# Patient Record
Sex: Female | Born: 1955
Health system: Southern US, Community
[De-identification: ages and names within clinical notes are randomized; demographics above are authoritative.]

## PROBLEM LIST (undated history)

## (undated) DIAGNOSIS — K648 Other hemorrhoids: Secondary | ICD-10-CM

## (undated) DIAGNOSIS — E785 Hyperlipidemia, unspecified: Secondary | ICD-10-CM

## (undated) DIAGNOSIS — F419 Anxiety disorder, unspecified: Secondary | ICD-10-CM

## (undated) DIAGNOSIS — L719 Rosacea, unspecified: Secondary | ICD-10-CM

## (undated) DIAGNOSIS — T8859XA Other complications of anesthesia, initial encounter: Secondary | ICD-10-CM

## (undated) DIAGNOSIS — G43909 Migraine, unspecified, not intractable, without status migrainosus: Secondary | ICD-10-CM

## (undated) DIAGNOSIS — Z87440 Personal history of urinary (tract) infections: Secondary | ICD-10-CM

## (undated) DIAGNOSIS — K219 Gastro-esophageal reflux disease without esophagitis: Secondary | ICD-10-CM

## (undated) DIAGNOSIS — F329 Major depressive disorder, single episode, unspecified: Secondary | ICD-10-CM

## (undated) DIAGNOSIS — R42 Dizziness and giddiness: Secondary | ICD-10-CM

## (undated) DIAGNOSIS — M1990 Unspecified osteoarthritis, unspecified site: Secondary | ICD-10-CM

## (undated) DIAGNOSIS — F32A Depression, unspecified: Secondary | ICD-10-CM

## (undated) DIAGNOSIS — Z8719 Personal history of other diseases of the digestive system: Secondary | ICD-10-CM

## (undated) DIAGNOSIS — I10 Essential (primary) hypertension: Secondary | ICD-10-CM

## (undated) HISTORY — DX: Hyperlipidemia, unspecified: E78.5

## (undated) HISTORY — DX: Essential (primary) hypertension: I10

## (undated) HISTORY — DX: Depression, unspecified: F32.A

## (undated) HISTORY — DX: Rosacea, unspecified: L71.9

## (undated) HISTORY — DX: Anxiety disorder, unspecified: F41.9

## (undated) HISTORY — DX: Other hemorrhoids: K64.8

## (undated) HISTORY — DX: Migraine, unspecified, not intractable, without status migrainosus: G43.909

## (undated) HISTORY — DX: Major depressive disorder, single episode, unspecified: F32.9

## (undated) HISTORY — PX: WISDOM TOOTH EXTRACTION: SHX21

## (undated) HISTORY — DX: Personal history of urinary (tract) infections: Z87.440

## (undated) HISTORY — DX: Dizziness and giddiness: R42

---

## 2007-11-19 ENCOUNTER — Ambulatory Visit (HOSPITAL_COMMUNITY): Admission: RE | Admit: 2007-11-19 | Discharge: 2007-11-19 | Payer: Self-pay | Admitting: Internal Medicine

## 2007-11-26 ENCOUNTER — Encounter: Admission: RE | Admit: 2007-11-26 | Discharge: 2007-11-26 | Payer: Self-pay | Admitting: Internal Medicine

## 2008-11-02 ENCOUNTER — Ambulatory Visit: Payer: Self-pay | Admitting: Internal Medicine

## 2008-11-13 ENCOUNTER — Ambulatory Visit: Payer: Self-pay | Admitting: Internal Medicine

## 2008-11-13 DIAGNOSIS — K648 Other hemorrhoids: Secondary | ICD-10-CM

## 2008-11-13 HISTORY — DX: Other hemorrhoids: K64.8

## 2008-11-13 HISTORY — PX: COLONOSCOPY: SHX174

## 2009-02-10 ENCOUNTER — Encounter: Admission: RE | Admit: 2009-02-10 | Discharge: 2009-02-10 | Payer: Self-pay | Admitting: Family Medicine

## 2009-09-02 ENCOUNTER — Ambulatory Visit: Payer: Self-pay | Admitting: Obstetrics and Gynecology

## 2010-03-23 ENCOUNTER — Encounter
Admission: RE | Admit: 2010-03-23 | Discharge: 2010-03-23 | Payer: Self-pay | Source: Home / Self Care | Attending: Family Medicine | Admitting: Family Medicine

## 2010-10-17 ENCOUNTER — Ambulatory Visit (INDEPENDENT_AMBULATORY_CARE_PROVIDER_SITE_OTHER): Payer: BC Managed Care – PPO | Admitting: Internal Medicine

## 2010-10-17 ENCOUNTER — Encounter: Payer: Self-pay | Admitting: Internal Medicine

## 2010-10-17 VITALS — BP 134/72 | HR 84 | Ht 64.0 in | Wt 168.0 lb

## 2010-10-17 DIAGNOSIS — R12 Heartburn: Secondary | ICD-10-CM | POA: Insufficient documentation

## 2010-10-17 DIAGNOSIS — Z6825 Body mass index (BMI) 25.0-25.9, adult: Secondary | ICD-10-CM

## 2010-10-17 DIAGNOSIS — R1013 Epigastric pain: Secondary | ICD-10-CM

## 2010-10-17 DIAGNOSIS — E663 Overweight: Secondary | ICD-10-CM

## 2010-10-17 DIAGNOSIS — K439 Ventral hernia without obstruction or gangrene: Secondary | ICD-10-CM | POA: Insufficient documentation

## 2010-10-17 NOTE — Assessment & Plan Note (Signed)
She is aware of her 57 and her exercise is limited by her epigastric symptoms at this time. She is hoping to return to her exercise regimen and lose weight.

## 2010-10-17 NOTE — Patient Instructions (Addendum)
You were seen for epigastric pain and ? of abdominal wall hernia today. Surgery appointment will be made for further evaluation. You also have what sounds like some nocturnal heartburn problems. Read the instructions below and you may use Tums or Mylanta as needed for that. Pay attention to the foods you eat. We will give you a call concerning your appointment with [facility].  Gastroesophageal Reflux Disease (GERD) Your caregiver has diagnosed your chest discomfort as caused by gastroesophageal reflux disease (GERD). GERD is caused by a reflux of acid from your stomach into the digestive tube between your mouth and stomach (esophagus). Acid in contact with the esophagus causes soreness (inflammation) resulting in heartburn or chest pain. It may cause small holes in the lining of the esophagus (ulcers). CAUSES  Increased body weight puts pressure on the stomach, making acid rise.   Smoking increases acid production.   Alcohol decreases pressure on the valve between the stomach and esophagus (lower esophageal sphincter), allowing acid from the stomach into the esophagus.   Late evening meals and a full stomach increase pressure and acid production.   Lower esophageal sphincter is malformed.   Sometimes, no reason is found.  HOME CARE INSTRUCTIONS  Change the factors that you can control. Weight, smoking, or alcohol changes may be difficult to change on your own. Your caregiver can provide guidance and medical therapy.   Raising the head of your bed may help you to sleep.   Over-the-counter medicines will decrease acid production. Your caregiver can also prescribe medicines for this. Only take over-the-counter or prescription medicines for pain, discomfort, or fever as directed by your caregiver.   1/2 to 1 teaspoon of an antacid taken every hour while awake, with meals, and at bedtime, can neutralize acid.   DO NOT take aspirin, ibuprofen, or other nonsteroidal  anti-inflammatory drugs (NSAIDs).  SEEK IMMEDIATE MEDICAL CARE IF:  The pain changes in location (radiates into arms, neck, jaw, teeth, or back), intensity, or duration.   You start feeling sick to your stomach (nauseous), start throwing up (vomiting), or sweating (diaphoresis).   You develop left arm or jaw pain.   You develop pain going into your back, shortness of breath, or you pass out.   There is vomiting of fluid that is green, yellow, or looks like coffee grounds or blood.  These symptoms could signal other problems, such as heart disease. MAKE SURE YOU:  Understand these instructions.   Will watch your condition.   Will get help right away if you are not doing well or get worse.  Document Released: 01/04/2005 Document Re-Released: 06/21/2009 [facility] Patient Information 2011 [place], [place].

## 2010-10-17 NOTE — Assessment & Plan Note (Signed)
She is having some episodes at night that sound like acid reflux or heartburn. I have suggested she try some antacids, she should pay close attention to the types of food she is eating and the triggers using an antacid when she eats something like a tomato-based or citrus food. Lifestyle instructions were given to the patient as well. Some of her recent weight gain may be contributing. I also question whether or not this could be some sort of peri-menopausal problem and may actually be a hot flash.

## 2010-10-17 NOTE — Progress Notes (Signed)
  Subjective:    Patient ID: Michele Lowery, female    DOB: Dec 05, 1955, 55 y.o.   MRN: [id number]  HPI 55 yo white woman  that developed epigastric pain doing yoga several months ago. Something popped out and was painful and tender. Drs. Guest, Gottsegen and richter ? Hernia.   Also getting a "washing pain" coming up through her abdomen and a warmth or hot sensation that follows. It is usually in early AM (3 AM). Was going to gym but stopped in 2011 but stopped due to this pain, she was doing > 100 crunches. Has gained weight since then, also going through menopause - no menses in 4-5 months.  Also saw Dr. Eda Paschal and cyst on fallopian tube found, thought possibly causing lower abdominal and pelvic pain but he thought conservative approach was prudent. Past Medical History  Diagnosis Date  . Vertigo   . Rosacea   . HLD (hyperlipidemia)   . HTN (hypertension)   . Depression   . Internal hemorrhoids 11/13/08  . Hx: UTI (urinary tract infection)    Past Surgical History  Procedure Date  . Colonoscopy 11/13/2008    internal hemrrhoids    reports that she has quit smoking. She has never used smokeless tobacco. She reports that she drinks alcohol. She reports that she does not use illicit drugs. family history includes Heart disease in her father.  There is no history of Colon cancer. Allergies  Allergen Reactions  . Doxycycline     REACTION: nausea and vomiting  . Sulfonamide Derivatives     REACTION: "i can't remember the reaction"        Review of Systems + fatigue, skin rash (dermatophyte), and leg/foot cramps. All other ROS negative or as above.    Objective:   Physical Exam  Constitutional: She is oriented to person, place, and time. She appears well-developed and well-nourished.       overweight  HENT:  Head: Normocephalic.  Cardiovascular: Normal rate, regular rhythm and normal heart sounds.   Pulmonary/Chest: Effort normal and breath sounds normal.  Abdominal: Soft.  Bowel sounds are normal. She exhibits no distension and no mass. There is tenderness. There is no rebound and no guarding.       Tender in epigastrium but not xiphoid ? Some defect in the area but no hernia detected even with flexion of abdomen and bearing down  Neurological: She is alert and oriented to person, place, and time.  Psychiatric: She has a normal mood and affect.          Assessment & Plan:

## 2010-10-17 NOTE — Assessment & Plan Note (Signed)
Her history is suggestive of an abdominal hernia but I cannot find on exam today and apparently no other physician has found it. She is quite bothered by this and her tenderness in the abdominal history, however see a surgeon for further evaluation. I told her that a CT scan could be considered but I would wait for the surgeon's evaluation for any other testing. I don't think this is a gastric or other problem that endoscopy would help diagnose though I suppose it could be needed but will again wait for the surgeon's opinion.

## 2010-10-27 ENCOUNTER — Ambulatory Visit (INDEPENDENT_AMBULATORY_CARE_PROVIDER_SITE_OTHER): Payer: BC Managed Care – PPO | Admitting: General Surgery

## 2010-10-27 ENCOUNTER — Encounter (INDEPENDENT_AMBULATORY_CARE_PROVIDER_SITE_OTHER): Payer: Self-pay | Admitting: General Surgery

## 2010-10-27 DIAGNOSIS — R1013 Epigastric pain: Secondary | ICD-10-CM

## 2010-10-27 NOTE — Progress Notes (Signed)
Subjective:     Patient ID: Michele Lowery, female   DOB: 08-Nov-1955, 55 y.o.   MRN: [id number]  HPI  This is a 54 year-old Caucasian female, sent to me for the courtesy of Dr. Nadyne Coombes and Dr. Stan Head for evaluation of epigastric abdominal pain. An epigastric hernia is suspected but has not been documented.  The patient states that she has had diffuse abdominal pain for 9 months. This is intermittent without any aggravating or alleviating factors. She has seen Dr. Edyth Gunnels and he told her that she has an ovarian cyst that may or may not require surgery. She is now in the peri-menopausal state.  A few months ago, while doing yoga, she felt a lump that popped out in the mid epigastrium. This has happened at other times during  other exercises. If it can be painful. She does not have a lot of pain  today. She saw Dr. Stan Head who thought that she might have an epigastric hernia that could not be documented on physical exam. The patient was sent to me for further evaluation. She is not in any pain today.         Past Medical History  Diagnosis Date  . Vertigo   . Rosacea   . HLD (hyperlipidemia)   . HTN (hypertension)   . Internal hemorrhoids 11/13/08  . Hx: UTI (urinary tract infection)    Current outpatient prescriptions:amLODipine (NORVASC) 5 MG tablet, Take 5 mg by mouth daily.  , Disp: , Rfl: ;  aspirin 81 MG tablet, Take 81 mg by mouth daily.  , Disp: , Rfl: ;  buPROPion (WELLBUTRIN SR) 150 MG 12 hr tablet, 150 mg. Taking two a week--tapering off, Disp: , Rfl: ;  losartan-hydrochlorothiazide (HYZAAR) 100-12.5 MG per tablet, Take 1 tablet by mouth daily.  , Disp: , Rfl:  metroNIDAZOLE (METROGEL) 0.75 % gel, Apply topically 2 (two) times daily.  , Disp: , Rfl: ;  Omega-3 Fatty Acids (FISH OIL PO), Take 4 tablets by mouth daily. , Disp: , Rfl: ;  pravastatin (PRAVACHOL) 40 MG tablet, Take 40 mg by mouth daily.  , Disp: , Rfl: ;  tetracycline (ACHROMYCIN,SUMYCIN) 500 MG  capsule, Take 500 mg by mouth as needed.  , Disp: , Rfl: ;  Calcium Carbonate Antacid (ALKA-SELTZER ANTACID PO), Take by mouth.  , Disp: , Rfl:   Allergies  Allergen Reactions  . Doxycycline Nausea And Vomiting    REACTION: nausea and vomiting  . Lisinopril Cough  . Sulfonamide Derivatives     REACTION: "i can't remember the reaction"    History  Substance Use Topics  . Smoking status: Never Smoker   . Smokeless tobacco: Never Used  . Alcohol Use: 0.5 oz/week    1 drink(s) per week     1-2 per month        .Review of Systems  Constitutional: Negative.   HENT: Negative.   Eyes: Negative.   Respiratory: Negative.   Cardiovascular: Negative.   Gastrointestinal: Positive for abdominal pain. Negative for nausea, vomiting, diarrhea, constipation, blood in stool, abdominal distention, anal bleeding and rectal pain.  Genitourinary: Negative.   Musculoskeletal: Negative.   Skin: Negative.   Neurological: Negative.   Hematological: Negative.   Psychiatric/Behavioral: Negative.        Objective:   Physical Exam  Constitutional: She is oriented to person, place, and time. She appears well-developed and well-nourished. No distress.  HENT:  Head: Normocephalic and atraumatic.  Mouth/Throat: No oropharyngeal exudate.  Eyes: Conjunctivae and EOM are normal. Left eye exhibits discharge. No scleral icterus.  Neck: Normal range of motion. Neck supple. No JVD present. No thyromegaly present.  Cardiovascular: Normal rate, regular rhythm and normal heart sounds.  Exam reveals no friction rub.   No murmur heard. Pulmonary/Chest: Effort normal and breath sounds normal. No respiratory distress. She has no rales. She exhibits no tenderness.  Abdominal: Soft. Bowel sounds are normal. She exhibits no distension and no mass. There is tenderness. There is no rebound and no guarding.    Musculoskeletal: She exhibits no edema and no tenderness.  Lymphadenopathy:    She has no cervical  adenopathy.  Neurological: She is alert and oriented to person, place, and time. She exhibits normal muscle tone.  Skin: Skin is warm and dry. No rash noted. No erythema. No pallor.  Psychiatric: She has a normal mood and affect. Her behavior is normal. Judgment and thought content normal.       Assessment:     Epigastric abdominal pain. She gives a very good history for an epigastric hernia which is intermittently popping out, but I cannot confirm the diagnosis on physical exam today, despite a variety of maneuvers. She would like further evaluation because of persistent symptoms.  Ovarian cyst.    Plan:      Scheduled for CT scan of abdomen with and without contrast to see if we can document whether she has an abdominal wall defect.  Return to see me in 2-3 weeks for further discussion.  If we detect a hernia and if we decide to go ahead with surgery, I will have her seen by Dr. Edyth Gunnels  preop to see if she needs another ultrasound to rule out ovarian cyst.

## 2010-10-27 NOTE — Patient Instructions (Signed)
It sounds like you may have an epigastric hernia. I cannot demonstrate this on physical exam today. Because of ongoing symptoms, we will schedule you for a CT scan of the abdomen and pelvis to see if we can demonstrate the presence of the hernia. You will return to see me in 2-3 weeks after the CT scan is done to discuss further steps. If we decide that you have a hernia, we will ask you to see Dr. Inez Pilgrim  preop to see if your ovarian cyst is still present.

## 2010-11-02 ENCOUNTER — Ambulatory Visit
Admission: RE | Admit: 2010-11-02 | Discharge: 2010-11-02 | Disposition: A | Discharge status: Home / Self Care | Payer: BC Managed Care – PPO | Source: Ambulatory Visit | Attending: General Surgery | Admitting: General Surgery

## 2010-11-02 MED ORDER — IOHEXOL 300 MG/ML  SOLN
100.0000 mL | Freq: Once | INTRAMUSCULAR | Status: AC | PRN
  Administered 2010-11-02: 100 mL via INTRAVENOUS

## 2010-11-16 ENCOUNTER — Encounter (INDEPENDENT_AMBULATORY_CARE_PROVIDER_SITE_OTHER): Payer: BC Managed Care – PPO | Admitting: General Surgery

## 2010-11-21 ENCOUNTER — Encounter (INDEPENDENT_AMBULATORY_CARE_PROVIDER_SITE_OTHER): Payer: Self-pay | Admitting: General Surgery

## 2010-11-21 ENCOUNTER — Ambulatory Visit (INDEPENDENT_AMBULATORY_CARE_PROVIDER_SITE_OTHER): Payer: BC Managed Care – PPO | Admitting: General Surgery

## 2010-11-21 DIAGNOSIS — R109 Unspecified abdominal pain: Secondary | ICD-10-CM

## 2010-11-21 NOTE — Progress Notes (Signed)
Subjective:     Patient ID: Michele Lowery, female   DOB: [birth year], 55 y.o.   MRN: [id number]  HPI This pleasant 55 year old woman returnso see me to evaluate her epigastric pain. She states that she's had one or 2 episodes of a popping sensation in the epigastrium but has not felt a bulge. No change in her GI habits. CT scan of the head and pelvis was performed and is completely normal. There is no hernia in the abdominal wall. There is no intra-abdominal pathology. I have discussed this with her and show her the reports.  She remains concerned as to the cause of her pain and we have discussed this at length. I have told her that I think that it is most likely musculoskeletal strain or muscle spasm and that no surgery is indicated.  Past Medical History  Diagnosis Date  . Vertigo   . Rosacea   . HLD (hyperlipidemia)   . HTN (hypertension)   . Internal hemorrhoids 11/13/08  . Hx: UTI (urinary tract infection)    Current Outpatient Prescriptions  Medication Sig Dispense Refill  . aspirin 81 MG tablet Take 81 mg by mouth daily.        Marland Kitchen buPROPion (WELLBUTRIN SR) 150 MG 12 hr tablet 150 mg. Taking two a week--tapering off      . Calcium Carbonate Antacid (ALKA-SELTZER ANTACID PO) Take by mouth.        . losartan-hydrochlorothiazide (HYZAAR) 100-12.5 MG per tablet Take 1 tablet by mouth daily.        . metroNIDAZOLE (METROGEL) 0.75 % gel Apply topically 2 (two) times daily.        . Omega-3 Fatty Acids (FISH OIL PO) Take 4 tablets by mouth daily.       . pravastatin (PRAVACHOL) 40 MG tablet Take 40 mg by mouth daily.        Marland Kitchen tetracycline (ACHROMYCIN,SUMYCIN) 500 MG capsule Take 500 mg by mouth as needed.         Allergies  Allergen Reactions  . Doxycycline Nausea And Vomiting    REACTION: nausea and vomiting  . Lisinopril Cough  . Sulfonamide Derivatives     REACTION: "i can't remember the reaction"    Family History  Problem Relation Age of Onset  . COPD Mother   . Heart disease  Father     History  Substance Use Topics  . Smoking status: Never Smoker   . Smokeless tobacco: Never Used  . Alcohol Use: 0.5 oz/week    1 drink(s) per week     1-2 per month      ASSESSMENT Review of Systems 10 system review of systems is performed and is negative except as described above.    Objective:   Physical Exam The patient looks well and is in no distress.  Abdomen examined standing. There is no palpable abnormality. There is no actual defect. There is no pain or tenderness. There is no mass.    Assessment:     Intermittent epigastric pain. Suspect musculoskeletal strain or muscle spasm. There is no clinical or radiographic evidence for hernia.    Plan:     We had a long discussion about the nature of her symptoms and the findings on her workup to date.  She was advised that no surgery is indicated.  She was advised to eliminate  weight lifting and abdominal crutches from her exercise regimen for one month. She was given suggestions for substitute exercises.  She was advised to  return to see me p.r.n. should she develop a true bulge or lump in the abdominal wall. Otherwise see me p.r.n.

## 2010-11-21 NOTE — Patient Instructions (Signed)
The CT scan is completely normal. There is no evidence of hernia or any intra-abdominal pathology. Your physical exam once again is negative for any physical evidence of hernia. I suspect that the popping sensation is due to muscle spasm or musculoskeletal strain. I do not think you need an operation. I advise you to avoid weightlifting and abdominal crunches for about one month, and exercise with walking, stationary bicycle and elliptical trainer. If you have develop a clear-cut bulge please return to see me.

## 2011-04-12 ENCOUNTER — Other Ambulatory Visit: Payer: Self-pay | Admitting: Family Medicine

## 2011-04-12 DIAGNOSIS — Z139 Encounter for screening, unspecified: Secondary | ICD-10-CM

## 2011-04-20 ENCOUNTER — Ambulatory Visit
Admission: RE | Admit: 2011-04-20 | Discharge: 2011-04-20 | Disposition: A | Discharge status: Home / Self Care | Payer: BC Managed Care – PPO | Source: Ambulatory Visit | Attending: Family Medicine | Admitting: Family Medicine

## 2011-04-20 DIAGNOSIS — Z139 Encounter for screening, unspecified: Secondary | ICD-10-CM

## 2011-05-07 ENCOUNTER — Encounter: Payer: Self-pay | Admitting: Family Medicine

## 2011-05-07 DIAGNOSIS — G43909 Migraine, unspecified, not intractable, without status migrainosus: Secondary | ICD-10-CM

## 2011-05-07 HISTORY — DX: Migraine, unspecified, not intractable, without status migrainosus: G43.909

## 2011-05-22 ENCOUNTER — Encounter: Payer: Self-pay | Admitting: Family Medicine

## 2011-05-22 ENCOUNTER — Ambulatory Visit (INDEPENDENT_AMBULATORY_CARE_PROVIDER_SITE_OTHER): Payer: BC Managed Care – PPO | Admitting: Family Medicine

## 2011-05-22 VITALS — BP 140/82 | HR 82 | Temp 98.4°F | Resp 16 | Ht 65.5 in | Wt 170.4 lb

## 2011-05-22 DIAGNOSIS — K2900 Acute gastritis without bleeding: Secondary | ICD-10-CM

## 2011-05-22 DIAGNOSIS — L719 Rosacea, unspecified: Secondary | ICD-10-CM

## 2011-05-22 DIAGNOSIS — F329 Major depressive disorder, single episode, unspecified: Secondary | ICD-10-CM

## 2011-05-22 DIAGNOSIS — Z0000 Encounter for general adult medical examination without abnormal findings: Secondary | ICD-10-CM

## 2011-05-22 DIAGNOSIS — F32A Depression, unspecified: Secondary | ICD-10-CM | POA: Insufficient documentation

## 2011-05-22 DIAGNOSIS — R42 Dizziness and giddiness: Secondary | ICD-10-CM

## 2011-05-22 DIAGNOSIS — I10 Essential (primary) hypertension: Secondary | ICD-10-CM | POA: Insufficient documentation

## 2011-05-22 DIAGNOSIS — E785 Hyperlipidemia, unspecified: Secondary | ICD-10-CM

## 2011-05-22 DIAGNOSIS — G629 Polyneuropathy, unspecified: Secondary | ICD-10-CM

## 2011-05-22 DIAGNOSIS — K2970 Gastritis, unspecified, without bleeding: Secondary | ICD-10-CM

## 2011-05-22 LAB — COMPREHENSIVE METABOLIC PANEL
AST: 16 U/L (ref 0–37)
Alkaline Phosphatase: 88 U/L (ref 39–117)
BUN: 16 mg/dL (ref 6–23)
Creat: 0.7 mg/dL (ref 0.50–1.10)
Glucose, Bld: 95 mg/dL (ref 70–99)
Potassium: 4.5 mEq/L (ref 3.5–5.3)
Total Bilirubin: 0.5 mg/dL (ref 0.3–1.2)

## 2011-05-22 LAB — LIPID PANEL
HDL: 53 mg/dL (ref >39)
LDL Cholesterol: 105 mg/dL — ABNORMAL HIGH (ref 0–99)
Total CHOL/HDL Ratio: 3.8 Ratio
Triglycerides: 229 mg/dL — ABNORMAL HIGH (ref <150)
VLDL: 46 mg/dL — ABNORMAL HIGH (ref 0–40)

## 2011-05-22 LAB — CBC WITH DIFFERENTIAL/PLATELET
Basophils Absolute: 0 10*3/uL (ref 0.0–0.1)
Basophils Relative: 0 % (ref 0–1)
Eosinophils Absolute: 0.1 10*3/uL (ref 0.0–0.7)
Eosinophils Relative: 2 % (ref 0–5)
HCT: 43.8 % (ref 36.0–46.0)
MCH: 28.6 pg (ref 26.0–34.0)
MCHC: 32.4 g/dL (ref 30.0–36.0)
Monocytes Absolute: 0.7 10*3/uL (ref 0.1–1.0)
Monocytes Relative: 8 % (ref 3–12)
Neutro Abs: 5.8 10*3/uL (ref 1.7–7.7)
RDW: 12.9 % (ref 11.5–15.5)

## 2011-05-22 LAB — TSH: TSH: 2.654 u[IU]/mL (ref 0.350–4.500)

## 2011-05-22 MED ORDER — OMEPRAZOLE 20 MG PO CPDR: 20.0000 mg | DELAYED_RELEASE_CAPSULE | Freq: Every day | ORAL | Status: DC

## 2011-05-22 MED ORDER — FLUOXETINE HCL 40 MG PO CAPS: 40.0000 mg | ORAL_CAPSULE | Freq: Every day | ORAL | Status: DC

## 2011-05-22 MED ORDER — SUCRALFATE 1 G PO TABS: 1.0000 g | ORAL_TABLET | Freq: Two times a day (BID) | ORAL | Status: DC

## 2011-05-22 MED ORDER — PRAVASTATIN SODIUM 40 MG PO TABS: 40.0000 mg | ORAL_TABLET | Freq: Every day | ORAL | Status: DC

## 2011-05-22 MED ORDER — LOSARTAN POTASSIUM-HCTZ 100-12.5 MG PO TABS: 1.0000 | ORAL_TABLET | Freq: Every day | ORAL | Status: DC

## 2011-05-22 MED ORDER — LORAZEPAM 0.5 MG PO TABS: 0.5000 mg | ORAL_TABLET | Freq: Two times a day (BID) | ORAL | Status: DC

## 2011-05-22 NOTE — Progress Notes (Signed)
  Subjective:    Patient ID: Michele Lowery, female    DOB: 12-19-1955, 56 y.o.   MRN: [id number]  HPI Patient presents for CPE.  Please see pink intake form( to be scanned).  1) Complains of several month history of epigastric pain radiating into chest.  Symptoms worse at night. Burping frequently.  No weight loss or night sweats.  Cardiac evaluation normal(Dr. Alanda Amass).  2) Depression - recently with increased symptoms of fatigue, anhedonia, increased appetite and overall feeling overwhelmed.  Started on husbands Prozac 40mg  day two weeks ago and feels much better.  3) Peripheral neuropathy- unable to tolerate desipramine. Continues to be symptomatic.    Review of Systems  Constitutional: Positive for appetite change. Negative for unexpected weight change.  Gastrointestinal: Negative for nausea.  Neurological: Positive for dizziness (intermittant vertiginous symptoms ). Negative for headaches.       Objective:   Physical Exam  Constitutional: She is oriented to person, place, and time. She appears well-developed and well-nourished.  HENT:  Head: Normocephalic and atraumatic.  Right Ear: External ear normal.  Left Ear: External ear normal.  Nose: Nose normal.  Mouth/Throat: Oropharynx is clear and moist.  Eyes: Conjunctivae and EOM are normal. Pupils are equal, round, and reactive to light.  Neck: Normal range of motion. Neck supple. No thyromegaly present.  Cardiovascular: Normal rate, regular rhythm, normal heart sounds and intact distal pulses.   Pulmonary/Chest: Effort normal and breath sounds normal. Right breast exhibits no mass, no nipple discharge and no skin change. Left breast exhibits no mass, no nipple discharge and no skin change.  Abdominal: Soft. Bowel sounds are normal. There is no hepatosplenomegaly.  Genitourinary: Vagina normal and uterus normal. Right adnexum displays no mass. Left adnexum displays no mass. No vaginal discharge found.  Musculoskeletal:  Normal range of motion.  Neurological: She is alert and oriented to person, place, and time. She has normal reflexes. No cranial nerve deficit. She exhibits normal muscle tone.  Skin: Skin is warm and dry.          Assessment & Plan:   1. Routine general medical examination at a health care facility  Pap IG (Image Guided), Vitamin D 1,25 dihydroxy  2. HTN (hypertension)  CBC with Differential, Comprehensive metabolic panel, losartan-hydrochlorothiazide (HYZAAR) 100-12.5 MG per tablet  3. Dyslipidemia  Comprehensive metabolic panel, Lipid panel, pravastatin (PRAVACHOL) 40 MG tablet  4. Depression  TSH, FLUoxetine (PROZAC) 40 MG capsule  5. Rosacea    6. Vertigo  LORazepam (ATIVAN) 0.5 MG tablet  7. Gastritis  H. pylori antibody, IgG, sucralfate (CARAFATE) 1 G tablet, omeprazole (PRILOSEC) 20 MG capsule  8. Neuropathy  Vitamin B12    Call with follow up of GI symptoms in 2-3 weeks.  Sooner if symptoms progress. Recommend yoga and relaxation strategies in addition to Prozac to help with depression and stressors. Follow up 8 weeks

## 2011-05-24 LAB — PAP IG (IMAGE GUIDED)

## 2011-05-25 NOTE — Progress Notes (Signed)
Quick Note:  Please call and send patient copy of labs. Let her know her triglycerides are high and to start on fish oil (1) 1000mg  capsule daily. She already has a follow up apt with me At 104.  ______

## 2011-07-10 ENCOUNTER — Ambulatory Visit: Payer: BC Managed Care – PPO | Admitting: Family Medicine

## 2011-07-24 ENCOUNTER — Ambulatory Visit (INDEPENDENT_AMBULATORY_CARE_PROVIDER_SITE_OTHER): Payer: BC Managed Care – PPO | Admitting: Family Medicine

## 2011-07-24 VITALS — BP 128/80 | HR 80 | Temp 99.0°F | Resp 16 | Ht 65.5 in | Wt 171.4 lb

## 2011-07-24 DIAGNOSIS — N393 Stress incontinence (female) (male): Secondary | ICD-10-CM

## 2011-07-24 DIAGNOSIS — G629 Polyneuropathy, unspecified: Secondary | ICD-10-CM

## 2011-07-24 DIAGNOSIS — F32A Depression, unspecified: Secondary | ICD-10-CM

## 2011-07-24 DIAGNOSIS — E785 Hyperlipidemia, unspecified: Secondary | ICD-10-CM

## 2011-07-24 DIAGNOSIS — I10 Essential (primary) hypertension: Secondary | ICD-10-CM

## 2011-07-24 DIAGNOSIS — F411 Generalized anxiety disorder: Secondary | ICD-10-CM

## 2011-07-24 DIAGNOSIS — K2970 Gastritis, unspecified, without bleeding: Secondary | ICD-10-CM

## 2011-07-24 DIAGNOSIS — F329 Major depressive disorder, single episode, unspecified: Secondary | ICD-10-CM

## 2011-07-24 DIAGNOSIS — R42 Dizziness and giddiness: Secondary | ICD-10-CM

## 2011-07-24 DIAGNOSIS — L719 Rosacea, unspecified: Secondary | ICD-10-CM

## 2011-07-24 MED ORDER — SUCRALFATE 1 G PO TABS: 1.0000 g | ORAL_TABLET | Freq: Two times a day (BID) | ORAL | Status: DC

## 2011-07-24 MED ORDER — OMEPRAZOLE 20 MG PO CPDR: 20.0000 mg | DELAYED_RELEASE_CAPSULE | Freq: Every day | ORAL | Status: DC

## 2011-07-24 MED ORDER — LOSARTAN POTASSIUM-HCTZ 100-12.5 MG PO TABS: 1.0000 | ORAL_TABLET | Freq: Every day | ORAL | Status: DC

## 2011-07-24 MED ORDER — PRAVASTATIN SODIUM 40 MG PO TABS: 40.0000 mg | ORAL_TABLET | Freq: Every day | ORAL | Status: DC

## 2011-07-24 NOTE — Progress Notes (Signed)
  Subjective:    Patient ID: Michele Lowery, female    DOB: 1955/09/11, 56 y.o.   MRN: [id number]  HPI Patient presents in follow up of issues discussed at CPE  SUI- symptoms with cough or sneeze  Depression- much improved with Prozac. Increased motivation and energy.  Gerd- no further symptoms with Carafate.  Recent opthalmology exam -WNL Recent dermatology visit - abnormal mole on back biopsied  Review of Systems     Objective:   Physical Exam  Constitutional: She appears well-developed.  HENT:  Head: Normocephalic.  Neck: Neck supple.  Cardiovascular: Normal rate, regular rhythm and normal heart sounds.   Pulmonary/Chest: Effort normal and breath sounds normal.  Neurological: She is alert.  Skin: Skin is warm.  Psychiatric: She has a normal mood and affect.           Assessment & Plan:   1. Gastritis  sucralfate (CARAFATE) 1 G tablet, omeprazole (PRILOSEC) 20 MG capsule  2. Depression    3. SUI (stress urinary incontinence, female)  Patient opts to hold on Detrol for now  4. Dyslipidemia  pravastatin (PRAVACHOL) 40 MG tablet  5. HTN (hypertension)  losartan-hydrochlorothiazide (HYZAAR) 100-12.5 MG per tablet  6. Rosacea    7. Neuropathy    8. Vertigo     Continue current medications Anticipatory guidance

## 2011-09-16 ENCOUNTER — Other Ambulatory Visit: Payer: Self-pay | Admitting: Family Medicine

## 2011-09-23 ENCOUNTER — Other Ambulatory Visit: Payer: Self-pay | Admitting: Family Medicine

## 2011-09-28 ENCOUNTER — Other Ambulatory Visit: Payer: Self-pay | Admitting: Family Medicine

## 2012-06-21 ENCOUNTER — Telehealth: Payer: Self-pay | Admitting: Radiology

## 2012-06-21 NOTE — Telephone Encounter (Signed)
There is a form at PA desk Flexeril contraindicated in heart failure. Pharmacy will not dispense, do you want to change?

## 2012-06-21 NOTE — Telephone Encounter (Signed)
Error- please disregard

## 2012-07-31 ENCOUNTER — Encounter: Payer: Self-pay | Admitting: Family Medicine

## 2012-07-31 ENCOUNTER — Ambulatory Visit (INDEPENDENT_AMBULATORY_CARE_PROVIDER_SITE_OTHER): Payer: BC Managed Care – PPO | Admitting: Family Medicine

## 2012-07-31 VITALS — BP 122/71 | HR 75 | Temp 98.2°F | Resp 16 | Ht 66.0 in | Wt 169.0 lb

## 2012-07-31 DIAGNOSIS — E785 Hyperlipidemia, unspecified: Secondary | ICD-10-CM

## 2012-07-31 DIAGNOSIS — H9209 Otalgia, unspecified ear: Secondary | ICD-10-CM

## 2012-07-31 DIAGNOSIS — H9201 Otalgia, right ear: Secondary | ICD-10-CM

## 2012-07-31 DIAGNOSIS — I10 Essential (primary) hypertension: Secondary | ICD-10-CM

## 2012-07-31 DIAGNOSIS — K2970 Gastritis, unspecified, without bleeding: Secondary | ICD-10-CM

## 2012-07-31 LAB — COMPREHENSIVE METABOLIC PANEL
Albumin: 4.8 g/dL (ref 3.5–5.2)
BUN: 12 mg/dL (ref 6–23)
CO2: 31 mEq/L (ref 19–32)
Glucose, Bld: 89 mg/dL (ref 70–99)
Potassium: 4.2 mEq/L (ref 3.5–5.3)
Sodium: 140 mEq/L (ref 135–145)
Total Protein: 7.1 g/dL (ref 6.0–8.3)

## 2012-07-31 LAB — LIPID PANEL
Cholesterol: 256 mg/dL — ABNORMAL HIGH (ref 0–200)
HDL: 50 mg/dL (ref >39)
Triglycerides: 283 mg/dL — ABNORMAL HIGH (ref <150)

## 2012-07-31 MED ORDER — OMEPRAZOLE 20 MG PO CPDR: 20.0000 mg | DELAYED_RELEASE_CAPSULE | Freq: Every day | ORAL | Status: DC

## 2012-07-31 MED ORDER — CETIRIZINE HCL 10 MG PO TABS: 10.0000 mg | ORAL_TABLET | Freq: Every day | ORAL | Status: DC

## 2012-07-31 MED ORDER — FLUOXETINE HCL 40 MG PO CAPS: 40.0000 mg | ORAL_CAPSULE | Freq: Every day | ORAL | Status: DC

## 2012-07-31 MED ORDER — SUCRALFATE 1 G PO TABS: ORAL_TABLET | ORAL | Status: DC

## 2012-07-31 MED ORDER — LOSARTAN POTASSIUM-HCTZ 100-12.5 MG PO TABS
1.0000 | ORAL_TABLET | Freq: Every day | ORAL | Status: DC
Start: 2012-07-31 — End: 2013-10-01

## 2012-07-31 MED ORDER — PRAVASTATIN SODIUM 40 MG PO TABS: 40.0000 mg | ORAL_TABLET | Freq: Every day | ORAL | Status: DC

## 2012-07-31 NOTE — Patient Instructions (Addendum)
All chronic medications have been refilled.  RE: Right ear pain and throat pain- if your symptoms are not improved in 1 month, contact the office. You may require evaluation by ENT specialist.

## 2012-07-31 NOTE — Progress Notes (Signed)
S:  This 57 y.o. Cauc female is here for follow-up and medication refills. Today, she has R ear pain and pain in R side of throat; the pain is constant and is not increased w/ swallowing. Pt denies fever/ chills, n/d or neck pain. She spends a lot of time in her yard and has mild allergy symptoms. She is not having significant rhinorrhea, sinus pain or congestion or eye symptoms.  HTN- controlled on current medication w/o side effects. Pt denies fatigue, diaphoresis, CP or tightness, palpitations, SOB or DOE, HA, dizziness, numbness or syncope. She has a mild cough which she attributes to allergies.  Lipid disorder- pt takes Pravastatin 40 mg w/o side effects; she has not experienced GI problems, myalgias or muscle weakness.   GI symptoms (gastritis) were fully evaluated by Dr. Leone Payor; current medications (especially Sucralfate) have been very effective. Pt denies any change in stools/ toilet habits.  Depression- she is stable on current dose of Fluoxetine 40 mg daily.   PMHx, Soc Hx and Fam Hx reviewed.   ROS: As per HPI; otherwise noncontributory.   O: Filed Vitals:   07/31/12 0917  BP: 122/71  Pulse: 75  Temp: 98.2 F (36.8 C)  Resp: 16   GEN: In NAD; WN,WD. HEENT: [place]/AT; EOMI w/ clear conj/ sclerae. EACs normal w/ scant cerumen. L TM- normal; R TM- scant effusion but no retraction. Nose normal. Post ph erythematous w/ cobblestoning; no lesions noted. NECK: Supple; no LAN or TMG. Point tenderness to R of trachea; no palpable masses. COR: RRR. No m/g/r. LUNGS: CTA. Normal resp rate and effort. SKIN: W&D; no rashes or pallor. MS: MAEs; no c/c/e. No deformities. NEURO: A&O x 3; CNs intact. Nonfocal.  A/P: Otalgia of right ear- RX: Cetirizine 10 mg 1 tablet every PM; if not improved in 1 month, may require ENT evaluation.  HTN (hypertension) - stable; no medication change.  Plan: losartan-hydrochlorothiazide (HYZAAR) 100-12.5 MG per tablet, Comprehensive metabolic  panel  Dyslipidemia - Plan: pravastatin (PRAVACHOL) 40 MG tablet, Lipid panel  Gastritis - Plan: omeprazole (PRILOSEC) 20 MG capsule, sucralfate (CARAFATE) 1 G tablet  Meds ordered this encounter  Medications  . FLUoxetine (PROZAC) 40 MG capsule    Sig: Take 1 capsule (40 mg total) by mouth daily.    Dispense:  90 capsule    Refill:  3  . losartan-hydrochlorothiazide (HYZAAR) 100-12.5 MG per tablet    Sig: Take 1 tablet by mouth daily.    Dispense:  90 tablet    Refill:  3  . pravastatin (PRAVACHOL) 40 MG tablet    Sig: Take 1 tablet (40 mg total) by mouth daily.    Dispense:  90 tablet    Refill:  3  . omeprazole (PRILOSEC) 20 MG capsule    Sig: Take 1 capsule (20 mg total) by mouth daily.    Dispense:  90 capsule    Refill:  3  . sucralfate (CARAFATE) 1 G tablet    Sig: Take 1 tablet once a day.    Dispense:  90 tablet    Refill:  3  . cetirizine (ZYRTEC) 10 MG tablet    Sig: Take 1 tablet (10 mg total) by mouth daily.    Dispense:  30 tablet    Refill:  5

## 2012-08-01 ENCOUNTER — Other Ambulatory Visit: Payer: Self-pay | Admitting: Family Medicine

## 2012-08-01 DIAGNOSIS — E785 Hyperlipidemia, unspecified: Secondary | ICD-10-CM

## 2012-08-01 MED ORDER — PRAVASTATIN SODIUM 80 MG PO TABS: ORAL_TABLET | ORAL | Status: DC

## 2012-08-01 NOTE — Progress Notes (Signed)
Quick Note:  Please contact pt and advise that the following labs are abnormal... Chemistries are normal ; this includes blood sugar, electrolytes, kidney and liver tests.  Cholesterol and triglycerides are way above normal and values have gone up compared to 1 year ago. I will need to increase the Pravastatin dose to 80 mg per day. Your heart disease risk based on these numbers is above the average risk. You can take 2 Pravastatin 40 mg tablets every night. When you pick up your new prescription, each tablet will be 80 mg; take one tablet every evening (at bedtime). Your lipid profile will need to be rechecked in ~ 8 - 10 weeks. This has been scheduled then schedule a visit with me for 3 months.   Copy to pt. ______

## 2012-08-02 ENCOUNTER — Encounter: Payer: Self-pay | Admitting: *Deleted

## 2012-11-06 ENCOUNTER — Other Ambulatory Visit (INDEPENDENT_AMBULATORY_CARE_PROVIDER_SITE_OTHER): Payer: BC Managed Care – PPO | Admitting: *Deleted

## 2012-11-06 DIAGNOSIS — E785 Hyperlipidemia, unspecified: Secondary | ICD-10-CM

## 2012-11-06 LAB — LIPID PANEL
HDL: 55 mg/dL (ref >39)
LDL Cholesterol: 118 mg/dL — ABNORMAL HIGH (ref 0–99)
Total CHOL/HDL Ratio: 3.9 Ratio
Triglycerides: 196 mg/dL — ABNORMAL HIGH (ref <150)
VLDL: 39 mg/dL (ref 0–40)

## 2012-11-06 NOTE — Progress Notes (Signed)
Patient here for labs only. 

## 2012-11-08 NOTE — Progress Notes (Signed)
Quick Note:  Please contact pt and advise that the following labs are abnormal...  Lipid values are improved compared to 3 months ago. Continue current medications, nutrition changes and exercise for weight loss.  Copy to pt. ______

## 2012-11-14 ENCOUNTER — Encounter: Payer: BC Managed Care – PPO | Admitting: Family Medicine

## 2012-12-11 ENCOUNTER — Encounter: Payer: Self-pay | Admitting: Family Medicine

## 2012-12-11 ENCOUNTER — Ambulatory Visit (INDEPENDENT_AMBULATORY_CARE_PROVIDER_SITE_OTHER): Payer: Federal, State, Local not specified - PPO | Admitting: Family Medicine

## 2012-12-11 VITALS — BP 123/75 | HR 69 | Temp 98.3°F | Resp 16 | Ht 66.0 in | Wt 166.0 lb

## 2012-12-11 DIAGNOSIS — I498 Other specified cardiac arrhythmias: Secondary | ICD-10-CM

## 2012-12-11 DIAGNOSIS — Z1159 Encounter for screening for other viral diseases: Secondary | ICD-10-CM

## 2012-12-11 DIAGNOSIS — Z130 Encounter for screening for diseases of the blood and blood-forming organs and certain disorders involving the immune mechanism: Secondary | ICD-10-CM

## 2012-12-11 DIAGNOSIS — Z23 Encounter for immunization: Secondary | ICD-10-CM

## 2012-12-11 DIAGNOSIS — E785 Hyperlipidemia, unspecified: Secondary | ICD-10-CM

## 2012-12-11 DIAGNOSIS — R001 Bradycardia, unspecified: Secondary | ICD-10-CM

## 2012-12-11 DIAGNOSIS — Z0000 Encounter for general adult medical examination without abnormal findings: Secondary | ICD-10-CM

## 2012-12-11 DIAGNOSIS — G609 Hereditary and idiopathic neuropathy, unspecified: Secondary | ICD-10-CM

## 2012-12-11 LAB — POCT URINALYSIS DIPSTICK
Blood, UA: NEGATIVE
Nitrite, UA: NEGATIVE
Urobilinogen, UA: 0.2
pH, UA: 7.5

## 2012-12-11 LAB — T4, FREE: Free T4: 0.96 ng/dL (ref 0.80–1.80)

## 2012-12-11 LAB — CBC WITH DIFFERENTIAL/PLATELET
Eosinophils Relative: 1 % (ref 0–5)
HCT: 41.2 % (ref 36.0–46.0)
Lymphocytes Relative: 21 % (ref 12–46)
MCV: 86.2 fL (ref 78.0–100.0)
Monocytes Absolute: 0.5 10*3/uL (ref 0.1–1.0)
Monocytes Relative: 8 % (ref 3–12)
Neutro Abs: 4.7 10*3/uL (ref 1.7–7.7)
RBC: 4.78 MIL/uL (ref 3.87–5.11)

## 2012-12-11 LAB — TSH: TSH: 2.134 u[IU]/mL (ref 0.350–4.500)

## 2012-12-11 NOTE — Progress Notes (Signed)
Subjective:    Patient ID: Michele Lowery, female    DOB: Sep 22, 1955, 57 y.o.   MRN: [id number]  HPI This 57 y.o. Cauc female is here for CPE. She has well controlled HTN and hyperlipidemia.  Pravastatin dose was increased to 80 mg 4 months ago but pt has concerns about CNS effects  of statins; she would like to decrease dose back to 40 mg. She is also taking Fish Oil. Other  chronic problems include peripheral neuropathy; pt has some relief w/ [place] soap under  bed sheets. Pt also has some skin tags and other skin lesions; she will contact [facility]  (Dr. Dorita Sciara office) and sch appt.   HCM: MMG- pt to schedule.            PAP- current.            CRS- current (2010).            IMM- current; needs Tdap.  PMHx, Soc Hx and Fam Hx reviewed. Medications reconciled.   Review of Systems  Skin: Positive for rash.  Neurological:       Neuropathy in feet- burning/ tingling esp on plantar aspect.  All other systems reviewed and are negative.       Objective:   Physical Exam  Nursing note and vitals reviewed. Constitutional: She is oriented to person, place, and time. Vital signs are normal. She appears well-developed and well-nourished. No distress.  HENT:  Head: Normocephalic and atraumatic.  Right Ear: Hearing, tympanic membrane, external ear and ear canal normal.  Left Ear: Hearing, tympanic membrane, external ear and ear canal normal.  Nose: Nose normal. No mucosal edema, rhinorrhea, nasal deformity or septal deviation.  Mouth/Throat: Uvula is midline, oropharynx is clear and moist and mucous membranes are normal. No oral lesions. Normal dentition. No dental caries.  Eyes: Conjunctivae, EOM and lids are normal. Pupils are equal, round, and reactive to light. No scleral icterus.  Fundoscopic exam:      The right eye shows no arteriolar narrowing, no AV nicking and no papilledema. The right eye shows red reflex.       The left eye shows no arteriolar narrowing, no AV nicking  and no papilledema. The left eye shows red reflex.  Pt has periodic vision eval w/ Eye Care professional.  Neck: Normal range of motion and full passive range of motion without pain. Neck supple. No JVD present. No spinous process tenderness and no muscular tenderness present. Normal range of motion present. No mass and no thyromegaly present.  Cardiovascular: Regular rhythm, S1 normal, S2 normal, normal heart sounds and normal pulses.   No extrasystoles are present. Bradycardia present.  PMI is not displaced.  Exam reveals no gallop and no friction rub.   No murmur heard. Pulmonary/Chest: Effort normal and breath sounds normal. No respiratory distress. Right breast exhibits no inverted nipple, no mass, no nipple discharge, no skin change and no tenderness. Left breast exhibits no inverted nipple, no mass, no nipple discharge, no skin change and no tenderness. Breasts are symmetrical.  Abdominal: Soft. Bowel sounds are normal. She exhibits no distension, no pulsatile midline mass and no mass. There is no hepatosplenomegaly. There is no tenderness. There is no guarding and no CVA tenderness.  Genitourinary: Rectal exam shows external hemorrhoid. Rectal exam shows no internal hemorrhoid, no fissure, no mass, no tenderness and anal tone normal. Guaiac negative stool.  Musculoskeletal: Normal range of motion. She exhibits no edema and no tenderness.  Lymphadenopathy:  Head (right side): No submental, no submandibular, no tonsillar, no posterior auricular and no occipital adenopathy present.       Head (left side): No submental, no submandibular, no tonsillar, no posterior auricular and no occipital adenopathy present.    She has no cervical adenopathy.    She has no axillary adenopathy.       Right: No inguinal and no supraclavicular adenopathy present.       Left: No inguinal and no supraclavicular adenopathy present.  Neurological: She is alert and oriented to person, place, and time. She has  normal strength and normal reflexes. She is not disoriented. She displays no atrophy. No cranial nerve deficit or sensory deficit. She exhibits normal muscle tone. Coordination and gait normal.  Skin: Skin is warm, dry and intact. Lesion noted. No ecchymosis, no petechiae and no rash noted. She is not diaphoretic. No cyanosis. No pallor. Nails show no clubbing.  Skin tags and small strawberry hemagiomas in trunk.  Psychiatric: She has a normal mood and affect. Her speech is normal and behavior is normal. Judgment and thought content normal. Cognition and memory are normal.     Results for orders placed in visit on 12/11/12  POCT URINALYSIS DIPSTICK      Result Value Range   Color, UA yellow     Clarity, UA clear     Glucose, UA neg     Bilirubin, UA neg     Ketones, UA neg     Spec Grav, UA 1.020     Blood, UA neg     pH, UA 7.5     Protein, UA trace     Urobilinogen, UA 0.2     Nitrite, UA neg     Leukocytes, UA Trace    IFOBT (OCCULT BLOOD)      Result Value Range   IFOBT Negative      ECG: Sinus bradycardia; no ST-TW changes. No ectopy.     Assessment & Plan:  Routine general medical examination at a health care facility - Plan: POCT urinalysis dipstick, EKG 12-Lead, TSH, T4, Free, T3, Free, IFOBT POC (occult bld, rslt in office)  Sinus bradycardia on ECG- Pt is asymptomatic.  Unspecified hereditary and idiopathic peripheral neuropathy  Other and unspecified hyperlipidemia - Pt prefers to reduce Pravastatin dose back to 40 mg hs. Will recheck lipids in 3 months.  Plan: Comprehensive metabolic panel, Lipid panel  Screening for other and unspecified endocrine, nutritional, metabolic, and immunity disorders - Plan: CBC with Differential  Need for hepatitis C screening test - Plan: Hepatitis C antibody  Need for Tdap vaccination - Plan: Tdap vaccine greater than or equal to 7yo IM

## 2012-12-11 NOTE — Patient Instructions (Signed)

## 2012-12-12 ENCOUNTER — Encounter: Payer: Self-pay | Admitting: Family Medicine

## 2013-01-20 ENCOUNTER — Other Ambulatory Visit: Payer: Self-pay | Admitting: Family Medicine

## 2013-01-20 NOTE — Telephone Encounter (Signed)
Dr Audria Nine, we rec a request to RF pravastatin 80 mg tabs, but see at last OV in 12/2012, pt wanted to go back to 40 mg QD, and you advised pt to come back in 3 mos for lipid recheck. I have refused the 80 mg and pended a 90 day supply of the 40 mg tabs for your review/signature.

## 2013-01-21 MED ORDER — PRAVASTATIN SODIUM 40 MG PO TABS: 40.0000 mg | ORAL_TABLET | Freq: Every day | ORAL | Status: DC

## 2013-01-21 NOTE — Telephone Encounter (Signed)
I have authorized pravastatin 40 mg 1 tab hs; pt needs to return in Dec 2014 for recheck of lipid panel. Please advise pt that the 40 mg tablet has been refilled and she needs to take a whole tablet at bedtime.

## 2013-01-27 ENCOUNTER — Other Ambulatory Visit: Payer: Self-pay

## 2013-01-27 DIAGNOSIS — Z1231 Encounter for screening mammogram for malignant neoplasm of breast: Secondary | ICD-10-CM

## 2013-02-13 ENCOUNTER — Other Ambulatory Visit: Payer: Self-pay

## 2013-02-25 ENCOUNTER — Ambulatory Visit
Admission: RE | Admit: 2013-02-25 | Discharge: 2013-02-25 | Disposition: A | Discharge status: Home / Self Care | Payer: Federal, State, Local not specified - PPO | Source: Ambulatory Visit

## 2013-02-25 DIAGNOSIS — Z1231 Encounter for screening mammogram for malignant neoplasm of breast: Secondary | ICD-10-CM

## 2013-06-11 ENCOUNTER — Ambulatory Visit: Payer: BC Managed Care – PPO | Admitting: Family Medicine

## 2013-06-11 ENCOUNTER — Other Ambulatory Visit (INDEPENDENT_AMBULATORY_CARE_PROVIDER_SITE_OTHER): Payer: BC Managed Care – PPO | Admitting: Family Medicine

## 2013-06-11 DIAGNOSIS — E785 Hyperlipidemia, unspecified: Secondary | ICD-10-CM

## 2013-06-11 DIAGNOSIS — I10 Essential (primary) hypertension: Secondary | ICD-10-CM

## 2013-06-11 LAB — COMPLETE METABOLIC PANEL WITH GFR
ALBUMIN: 4.6 g/dL (ref 3.5–5.2)
ALT: 21 U/L (ref 0–35)
AST: 18 U/L (ref 0–37)
Alkaline Phosphatase: 80 U/L (ref 39–117)
BUN: 14 mg/dL (ref 6–23)
CALCIUM: 9.9 mg/dL (ref 8.4–10.5)
CO2: 33 meq/L — AB (ref 19–32)
Chloride: 99 mEq/L (ref 96–112)
Creat: 0.74 mg/dL (ref 0.50–1.10)
GFR, EST NON AFRICAN AMERICAN: 89 mL/min
GLUCOSE: 93 mg/dL (ref 70–99)
POTASSIUM: 4.3 meq/L (ref 3.5–5.3)
Sodium: 141 mEq/L (ref 135–145)
Total Bilirubin: 0.5 mg/dL (ref 0.2–1.2)
Total Protein: 7.1 g/dL (ref 6.0–8.3)

## 2013-06-11 LAB — LIPID PANEL
Cholesterol: 220 mg/dL — ABNORMAL HIGH (ref 0–200)
HDL: 59 mg/dL (ref >39)
LDL Cholesterol: 117 mg/dL — ABNORMAL HIGH (ref 0–99)
Total CHOL/HDL Ratio: 3.7 Ratio
Triglycerides: 220 mg/dL — ABNORMAL HIGH (ref <150)
VLDL: 44 mg/dL — ABNORMAL HIGH (ref 0–40)

## 2013-06-11 NOTE — Progress Notes (Signed)
Patient here for lab work only 

## 2013-06-13 ENCOUNTER — Encounter: Payer: Self-pay | Admitting: Family Medicine

## 2013-06-13 ENCOUNTER — Ambulatory Visit (INDEPENDENT_AMBULATORY_CARE_PROVIDER_SITE_OTHER): Payer: BC Managed Care – PPO | Admitting: Family Medicine

## 2013-06-13 VITALS — BP 125/79 | HR 74 | Temp 98.0°F | Resp 16 | Ht 65.5 in | Wt 171.4 lb

## 2013-06-13 DIAGNOSIS — E785 Hyperlipidemia, unspecified: Secondary | ICD-10-CM

## 2013-06-13 DIAGNOSIS — I10 Essential (primary) hypertension: Secondary | ICD-10-CM

## 2013-06-13 DIAGNOSIS — R42 Dizziness and giddiness: Secondary | ICD-10-CM

## 2013-06-13 LAB — POCT GLYCOSYLATED HEMOGLOBIN (HGB A1C): HEMOGLOBIN A1C: 5.4

## 2013-06-13 NOTE — Patient Instructions (Signed)
It is possible that some of your symptoms are related to Fluoxetine. We may discuss reducing the dose of this medication at your next visit. Try the AYR saline nasal mist and see if that helps. You do not have Diabetes.  Your annual physical is due in Sept 2015 but if you need to come back due to increasing symptoms or worsening in any way, just call and make an appointment. We can recheck your lipids in September.

## 2013-06-14 ENCOUNTER — Encounter: Payer: Self-pay | Admitting: Family Medicine

## 2013-06-15 ENCOUNTER — Encounter: Payer: Self-pay | Admitting: Family Medicine

## 2013-06-15 NOTE — Progress Notes (Signed)
S:  This 58 y.o. Cauc female has Lipid disorder dating back > 10 years, felt to be familial. She was initially prescribed Crestor 10 mg in 2006. Since that time, she has tried Lovastatin and Niacin (not well tolerated). She has been on Pravastatin 40 mg daily since 2012; this med is well tolerated w/o adverse effects. Also taking Fish Oil. Pt reports healthy nutrition and regular exercise.   Chronic dizziness dating back > 3 years, though intermittent, still bothers pt. Pt attributes this to sinus and middle ear congestion; Cetirizine tablets not effective, only caused drowsiness. Also experiencing sensation of heat (flushing) accompanying dizziness and "fullness" in head and neck.   She had Cardiovascular work-up at [facility] w/ Dr. Terance Ice in 2011. This included a Cardiolite stress test (negative) for ischemia on Sept 1, 2011. 2-D ECHO suggests possible volume depletion but normal systolic function, stage 1 Diastolic dysfunction, borderline LVH w/ no significant valve disease. Symptoms felt to be related to Desipramine; symptoms resolved once she stopped that medication. She has been on Fluoxetine and Bupropion for a few years, started after her mother's death.  HTN- well controlled on current medication w/o adverse effects. No reports of diaphoresis, fatigue, vision disturbances, CP or tightness, palpitations, SOB or DOE, cough, edeme, HA, numbness or weakness tremor or syncope.  Patient Active Problem List   Diagnosis Date Noted  . Vertigo   . Rosacea   . HLD (hyperlipidemia)   . HTN (hypertension)   . Depression   . ? Hernia of abdominal wall - epigastrium 10/17/2010    Class: Question of  . Heartburn 10/17/2010  . Overweight (BMI 25.0-29.9) 10/17/2010    Prior to Admission medications   Medication Sig Start Date End Date Taking? Authorizing Provider  aspirin 81 MG tablet Take 81 mg by mouth daily.     Yes Historical Provider, MD  cholecalciferol (VITAMIN D) 400 UNITS TABS tablet  Take 400 Units by mouth.   Yes Historical Provider, MD  FLUoxetine (PROZAC) 40 MG capsule Take 1 capsule (40 mg total) by mouth daily. 07/31/12  Yes Barton Fanny, MD  losartan-hydrochlorothiazide (HYZAAR) 100-12.5 MG per tablet Take 1 tablet by mouth daily. 07/31/12  Yes Barton Fanny, MD  Omega-3 Fatty Acids (FISH OIL PO) Take 4 tablets by mouth daily.    Yes Historical Provider, MD  omeprazole (PRILOSEC) 20 MG capsule Take 1 capsule (20 mg total) by mouth daily. 07/31/12 07/31/13 Yes Barton Fanny, MD  pravastatin (PRAVACHOL) 40 MG tablet Take 1 tablet (40 mg total) by mouth daily. 01/20/13  Yes Barton Fanny, MD  pyridOXINE (VITAMIN B-6) 100 MG tablet Take 100 mg by mouth daily.   Yes Historical Provider, MD  sucralfate (CARAFATE) 1 G tablet Take 1 tablet once a day. 07/31/12  Yes Barton Fanny, MD  LORazepam (ATIVAN) 0.5 MG tablet Take 1 tablet (0.5 mg total) by mouth 2 (two) times daily. 05/22/11   Hayden Rasmussen, MD   PMHx, Surg Hx, Soc and Fam Hx reviewed.  ROS: AS per HPI.  O: Filed Vitals:   06/13/13 1344  BP: 125/79  Pulse: 74  Temp: 98 F (36.7 C)  Resp: 16   GEN: In NAD; WN,WD. HENT: [place]/AT; EOMI w/ clear conj/sclerae. EACs normal. TMs normal w/o erythema/injection, effusion or scarring. Post pharynx clear w/ normal uvula; no erythema or lesions. NECK: Supple w/o LAN or TMG. No carotid bruits. COR: RRR. LUNGS: CTA; normal resp rate and effort. SKIN: W&D; intact w/o erythema,  rashes or pallor. NEURO: A&O x 3; Cns intact. Gait normal. DTRs intact/=. Nonfocal.  Results for orders placed in visit on 06/13/13  POCT GLYCOSYLATED HEMOGLOBIN (HGB A1C)      Result Value Ref Range   Hemoglobin A1C 5.4      A/P: Dizziness, nonspecific- Trial OTC AYR saline nasal mist. Consider lowering dose of Fluoxetine to 20 mg daily  HLD (hyperlipidemia) - Reviewed recent labs which indicate TChol= 220, TGs= 220, HDL= 59 and LDL= 117. CHD risk is a little below  average based on these values. Plan: POCT glycosylated hemoglobin (Hb A1C)  HTN (hypertension)- Stable on current medications; continue same.

## 2013-07-20 ENCOUNTER — Other Ambulatory Visit: Payer: Self-pay | Admitting: Family Medicine

## 2013-08-20 ENCOUNTER — Other Ambulatory Visit: Payer: Self-pay | Admitting: Family Medicine

## 2013-10-01 ENCOUNTER — Other Ambulatory Visit: Payer: Self-pay | Admitting: Family Medicine

## 2013-10-19 ENCOUNTER — Other Ambulatory Visit: Payer: Self-pay | Admitting: Family Medicine

## 2013-10-23 ENCOUNTER — Other Ambulatory Visit: Payer: Self-pay | Admitting: Family Medicine

## 2013-12-17 ENCOUNTER — Ambulatory Visit (INDEPENDENT_AMBULATORY_CARE_PROVIDER_SITE_OTHER): Payer: Federal, State, Local not specified - PPO | Admitting: Family Medicine

## 2013-12-17 ENCOUNTER — Encounter: Payer: Self-pay | Admitting: Family Medicine

## 2013-12-17 VITALS — BP 152/92 | HR 70 | Temp 98.2°F | Resp 17 | Ht 66.0 in | Wt 165.0 lb

## 2013-12-17 DIAGNOSIS — E785 Hyperlipidemia, unspecified: Secondary | ICD-10-CM

## 2013-12-17 DIAGNOSIS — G609 Hereditary and idiopathic neuropathy, unspecified: Secondary | ICD-10-CM

## 2013-12-17 DIAGNOSIS — Z23 Encounter for immunization: Secondary | ICD-10-CM

## 2013-12-17 DIAGNOSIS — I10 Essential (primary) hypertension: Secondary | ICD-10-CM

## 2013-12-17 DIAGNOSIS — Z760 Encounter for issue of repeat prescription: Secondary | ICD-10-CM

## 2013-12-17 DIAGNOSIS — Z0000 Encounter for general adult medical examination without abnormal findings: Secondary | ICD-10-CM

## 2013-12-17 DIAGNOSIS — G629 Polyneuropathy, unspecified: Secondary | ICD-10-CM | POA: Insufficient documentation

## 2013-12-17 LAB — COMPLETE METABOLIC PANEL WITH GFR
ALT: 17 U/L (ref 0–35)
AST: 15 U/L (ref 0–37)
Albumin: 4.5 g/dL (ref 3.5–5.2)
Alkaline Phosphatase: 80 U/L (ref 39–117)
BUN: 12 mg/dL (ref 6–23)
CALCIUM: 10 mg/dL (ref 8.4–10.5)
CHLORIDE: 101 meq/L (ref 96–112)
CO2: 33 mEq/L — ABNORMAL HIGH (ref 19–32)
CREATININE: 0.75 mg/dL (ref 0.50–1.10)
GFR, Est African American: >89 mL/min
GFR, Est Non African American: 88 mL/min
Glucose, Bld: 85 mg/dL (ref 70–99)
POTASSIUM: 4.7 meq/L (ref 3.5–5.3)
Sodium: 142 mEq/L (ref 135–145)
Total Bilirubin: 0.5 mg/dL (ref 0.2–1.2)
Total Protein: 6.6 g/dL (ref 6.0–8.3)

## 2013-12-17 LAB — LIPID PANEL
Cholesterol: 210 mg/dL — ABNORMAL HIGH (ref 0–200)
HDL: 47 mg/dL (ref >39)
LDL Cholesterol: 114 mg/dL — ABNORMAL HIGH (ref 0–99)
Total CHOL/HDL Ratio: 4.5 Ratio
Triglycerides: 247 mg/dL — ABNORMAL HIGH (ref <150)
VLDL: 49 mg/dL — ABNORMAL HIGH (ref 0–40)

## 2013-12-17 LAB — POCT URINALYSIS DIPSTICK
BILIRUBIN UA: NEGATIVE
Blood, UA: NEGATIVE
GLUCOSE UA: NEGATIVE
Ketones, UA: NEGATIVE
NITRITE UA: NEGATIVE
PH UA: 8.5
Spec Grav, UA: 1.015
Urobilinogen, UA: 0.2

## 2013-12-17 MED ORDER — LOSARTAN POTASSIUM-HCTZ 100-12.5 MG PO TABS: ORAL_TABLET | ORAL | Status: DC

## 2013-12-17 MED ORDER — ZOSTER VACCINE LIVE 19400 UNT/0.65ML ~~LOC~~ SOLR: 0.6500 mL | Freq: Once | SUBCUTANEOUS | Status: DC

## 2013-12-17 MED ORDER — ZOSTER VACCINE LIVE 19400 UNT/0.65ML ~~LOC~~ SOLR
0.6500 mL | Freq: Once | SUBCUTANEOUS | Status: DC
Start: 2013-12-17 — End: 2013-12-17

## 2013-12-17 NOTE — Progress Notes (Signed)
Subjective:    Patient ID: Michele Lowery, female    DOB: 1955/09/20, 58 y.o.   MRN: [id number]  HPI  This 58 y.o. Cauc female is here for CPE. She has controlled HTN and lipid disorder. She recently attended the marriage of adult child; the wedding took place in the [place]. This occasion was just this past weekend, was somewhat stressful but was a joyous celebration. She works part-time with a [employer]; she and her husband practice yoga 5 days/week. They travel to visit adult children who live in [place] and [place].  HCM: MMG- Current           PAP- 2013 (negative).           CRS- 2010 (normal); recall 2020.           IMM- Current; needs Zostavax.             Patient Active Problem List   Diagnosis Date Noted  . Peripheral neuropathy 12/17/2013  . Rosacea   . HLD (hyperlipidemia)   . HTN (hypertension)   . Depression   . ? Hernia of abdominal wall - epigastrium 10/17/2010    Class: Question of  . Heartburn 10/17/2010  . Overweight (BMI 25.0-29.9) 10/17/2010    Prior to Admission medications   Medication Sig Start Date End Date Taking? Authorizing Provider  aspirin 81 MG tablet Take 81 mg by mouth daily.     Yes Historical Provider, MD  b complex vitamins tablet Take 1 tablet by mouth daily.   Yes Historical Provider, MD  cholecalciferol (VITAMIN D) 400 UNITS TABS tablet Take 400 Units by mouth.   Yes Historical Provider, MD  FLUoxetine (PROZAC) 40 MG capsule TAKE 1 CAPSULE (40 MG TOTAL) BY MOUTH DAILY. 07/20/13  Yes Ryan M Dunn, PA-C  losartan-hydrochlorothiazide (HYZAAR) 100-12.5 MG per tablet TAKE 1 TABLET BY MOUTH DAILY.   Yes Barton Fanny, MD  Omega-3 Fatty Acids (FISH OIL PO) Take 4 tablets by mouth daily.    Yes Historical Provider, MD  omeprazole (PRILOSEC) 20 MG capsule TAKE 1 CAPSULE (20 MG TOTAL) BY MOUTH DAILY.   Yes Heather M Marte, PA-C  pravastatin (PRAVACHOL) 40 MG tablet TAKE 1 TABLET BY MOUTH EVERY DAY   Yes Barton Fanny, MD    sucralfate (CARAFATE) 1 G tablet TAKE 1 TABLET BY MOUTH ONCE DAILY 10/23/13  Yes Chelle S Jeffery, PA-C  zoster vaccine live, PF, (ZOSTAVAX) 01751 UNT/0.65ML injection Inject 19,400 Units into the skin once.    Barton Fanny, MD    History   Social History  . Marital Status: Married    Spouse Name: N/A    Number of Children: 2  . Years of Education: N/A   Occupational History  . Control and instrumentation engineer    Social History Main Topics  . Smoking status: Former Smoker -- 0.50 packs/day for 4 years    Types: Cigarettes    Quit date: 07/09/1976  . Smokeless tobacco: Never Used  . Alcohol Use: 0.5 oz/week    1 drink(s) per week     Comment: 1-2 per month   . Drug Use: No  . Sexual Activity: No   Other Topics Concern  . Not on file   Social History Narrative   1 caffeine drink daily     Family History  Problem Relation Age of Onset  . COPD Mother   . Heart disease Father     Review of Systems  Constitutional: Positive for  diaphoresis. Negative for fever, appetite change and fatigue.  HENT: Negative.   Eyes: Positive for visual disturbance.       Has exam scheduled.  Respiratory: Negative.   Cardiovascular: Negative.   Gastrointestinal: Negative.   Neurological: Negative.   Psychiatric/Behavioral: Negative.   All other systems reviewed and are negative.     Objective:   Physical Exam  Nursing note and vitals reviewed. Constitutional: She is oriented to person, place, and time. Vital signs are normal. She appears well-developed and well-nourished. No distress.  HENT:  Head: Normocephalic and atraumatic.  Right Ear: Hearing, tympanic membrane, external ear and ear canal normal.  Left Ear: Hearing, tympanic membrane, external ear and ear canal normal.  Nose: Nose normal. No nasal deformity or septal deviation.  Mouth/Throat: Uvula is midline, oropharynx is clear and moist and mucous membranes are normal. No oral lesions. Normal dentition. No dental caries.  Eyes:  Conjunctivae and EOM are normal. Pupils are equal, round, and reactive to light. No scleral icterus.  Fundoscopic exam:      The right eye shows no papilledema. The right eye shows red reflex.       The left eye shows no papilledema. The left eye shows red reflex.  Neck: Trachea normal, normal range of motion, full passive range of motion without pain and phonation normal. Neck supple. No JVD present. No spinous process tenderness and no muscular tenderness present. Carotid bruit is not present. No thyromegaly present.  Cardiovascular: Normal rate, regular rhythm, S1 normal, S2 normal, normal heart sounds, intact distal pulses and normal pulses.   No extrasystoles are present. PMI is not displaced.  Exam reveals no gallop.   No murmur heard. Pulmonary/Chest: Effort normal and breath sounds normal. No respiratory distress. She has no decreased breath sounds. She has no wheezes. Right breast exhibits no inverted nipple, no mass, no nipple discharge, no skin change and no tenderness. Left breast exhibits no inverted nipple, no mass, no nipple discharge, no skin change and no tenderness. Breasts are symmetrical.  Abdominal: Soft. Normal appearance, normal aorta and bowel sounds are normal. She exhibits no distension, no abdominal bruit, no pulsatile midline mass and no mass. There is no hepatosplenomegaly. There is no tenderness. There is no guarding and no CVA tenderness.  Genitourinary:  Deferred.  Musculoskeletal:       Cervical back: Normal.       Thoracic back: Normal.       Lumbar back: Normal.  Remainder of exam unremarkable.  Lymphadenopathy:       Head (right side): No submental, no submandibular, no tonsillar, no preauricular, no posterior auricular and no occipital adenopathy present.       Head (left side): No submental, no submandibular, no tonsillar, no preauricular, no posterior auricular and no occipital adenopathy present.    She has no cervical adenopathy.    She has no axillary  adenopathy.       Right: No inguinal and no supraclavicular adenopathy present.       Left: No inguinal and no supraclavicular adenopathy present.  Neurological: She is alert and oriented to person, place, and time. She has normal strength and normal reflexes. She displays no atrophy and no tremor. No cranial nerve deficit or sensory deficit. She exhibits normal muscle tone. She displays a negative Romberg sign. Coordination and gait normal. She displays no Babinski's sign on the right side. She displays no Babinski's sign on the left side.  Reflex Scores:      Tricep reflexes  are 2+ on the right side and 2+ on the left side.      Bicep reflexes are 2+ on the right side and 2+ on the left side.      Brachioradialis reflexes are 2+ on the right side and 2+ on the left side.      Patellar reflexes are 2+ on the right side and 2+ on the left side. Skin: Skin is warm, dry and intact. No ecchymosis, no lesion and no rash noted. She is not diaphoretic. No cyanosis or erythema. No pallor. Nails show no clubbing.  Psychiatric: She has a normal mood and affect. Her speech is normal and behavior is normal. Judgment and thought content normal. Cognition and memory are normal.   Results for orders placed in visit on 12/17/13  POCT URINALYSIS DIPSTICK      Result Value Ref Range   Color, UA yellow     Clarity, UA clear     Glucose, UA neg     Bilirubin, UA neg     Ketones, UA neg     Spec Grav, UA 1.015     Blood, UA neg     pH, UA 8.5     Protein, UA trace     Urobilinogen, UA 0.2     Nitrite, UA neg     Leukocytes, UA Trace         Assessment & Plan:  Routine general medical examination at a health care facility  Essential hypertension - Plan: POCT urinalysis dipstick, COMPLETE METABOLIC PANEL WITH GFR, Lipid panel  HLD (hyperlipidemia) - Plan: POCT urinalysis dipstick, COMPLETE METABOLIC PANEL WITH GFR, Lipid panel  Peripheral neuropathy- Continue B vitamin complex; will try PhysAssist  Foot Cream OTC.  Need for shingles vaccine - Plan: zoster vaccine live, PF, (ZOSTAVAX) 94174 UNT/0.65ML injection, DISCONTINUED: zoster vaccine live, PF, (ZOSTAVAX) 08144 UNT/0.65ML injection  Issue of repeat prescriptions  Meds ordered this encounter  Medications  . b complex vitamins tablet    Sig: Take 1 tablet by mouth daily.  Marland Kitchen losartan-hydrochlorothiazide (HYZAAR) 100-12.5 MG per tablet    Sig: TAKE 1 TABLET BY MOUTH DAILY.    Dispense:  90 tablet    Refill:  3  . DISCONTD: zoster vaccine live, PF, (ZOSTAVAX) 81856 UNT/0.65ML injection    Sig: Inject 19,400 Units into the skin once.    Dispense:  1 each    Refill:  0  . zoster vaccine live, PF, (ZOSTAVAX) 31497 UNT/0.65ML injection    Sig: Inject 19,400 Units into the skin once.    Dispense:  1 each    Refill:  0

## 2013-12-17 NOTE — Patient Instructions (Signed)
Keeping You Healthy  Get These Tests  Blood Pressure- Have your blood pressure checked by your healthcare provider at least once a year.  Normal blood pressure is 120/80.  Weight- Have your body mass index (BMI) calculated to screen for obesity.  BMI is a measure of body fat based on height and weight.  You can calculate your own BMI at [email or web address]  Cholesterol- Have your cholesterol checked every year.  Diabetes- Have your blood sugar checked every year if you have high blood pressure, high cholesterol, a family history of diabetes or if you are overweight.  Pap Smear- Have a pap smear every 1 to 3 years if you have been sexually active.  If you are older than 65 and recent pap smears have been normal you may not need additional pap smears.  In addition, if you have had a hysterectomy  For benign disease additional pap smears are not necessary.  Mammogram-Yearly mammograms are essential for early detection of breast cancer  Screening for Colon Cancer- Colonoscopy starting at age 50. Screening may begin sooner depending on your family history and other health conditions.  Follow up colonoscopy as directed by your Gastroenterologist.  Screening for Osteoporosis- Screening begins at age 65 with bone density scanning, sooner if you are at higher risk for developing Osteoporosis.  Get these medicines  Calcium with Vitamin D- Your body requires 1200-1500 mg of Calcium a day and 800-1000 IU of Vitamin D a day.  You can only absorb 500 mg of Calcium at a time therefore Calcium must be taken in 2 or 3 separate doses throughout the day.  Hormones- Hormone therapy has been associated with increased risk for certain cancers and heart disease.  Talk to your healthcare provider about if you need relief from menopausal symptoms.  Aspirin- Ask your healthcare provider about taking Aspirin to prevent Heart Disease and Stroke.  Get these Immuniztions  Flu shot- Every fall  Pneumonia  shot- Once after the age of 65; if you are younger ask your healthcare provider if you need a pneumonia shot.  Tetanus- Every ten years.  Zostavax- Once after the age of 60 to prevent shingles.  Take these steps  Don't smoke- Your healthcare provider can help you quit. For tips on how to quit, ask your healthcare provider or go to www.smokefree.gov or call 1-800 QUIT-NOW.  Be physically active- Exercise 5 days a week for a minimum of 30 minutes.  If you are not already physically active, start slow and gradually work up to 30 minutes of moderate physical activity.  Try walking, dancing, bike riding, swimming, etc.  Eat a healthy diet- Eat a variety of healthy foods such as fruits, vegetables, whole grains, low fat milk, low fat cheeses, yogurt, lean meats, chicken, fish, eggs, dried beans, tofu, etc.  For more information go to www.thenutritionsource.org  Dental visit- Brush and floss teeth twice daily; visit your dentist twice a year.  Eye exam- Visit your Optometrist or Ophthalmologist yearly.  Drink alcohol in moderation- Limit alcohol intake to one drink or less a day.  Never drink and drive.  Depression- Your emotional health is as important as your physical health.  If you're feeling down or losing interest in things you normally enjoy, please talk to your healthcare provider.  Seat Belts- can save your life; always wear one  Smoke/Carbon Monoxide detectors- These detectors need to be installed on the appropriate level of your home.  Replace batteries at least once a year.  Violence- If anyone   is threatening or hurting you, please tell your healthcare provider.  Living Will/ Health care power of attorney- Discuss with your healthcare provider and family.    YOUR BLOOD PRESSURE WHEN I RECHECKED IT= 130-140/70-80.  CONTINUE CURRENT MEDICATIONS AND LIFESTYLE HABITS. SEE YOU IN 6 MONTHS.       Mediterranean Diet  Why follow it? Research shows.   Those who follow the  Mediterranean diet have a reduced risk of heart disease    The diet is associated with a reduced incidence of Parkinson's and Alzheimer's diseases   People following the diet may have longer life expectancies and lower rates of chronic diseases    The Dietary Guidelines for Americans recommends the Mediterranean diet as an eating plan to promote health and prevent disease  What Is the Mediterranean Diet?    Healthy eating plan based on typical foods and recipes of Mediterranean-style cooking   The diet is primarily a plant based diet; these foods should make up a majority of meals   Starches - Plant based foods should make up a majority of meals - They are an important sources of vitamins, minerals, energy, antioxidants, and fiber - Choose whole grains, foods high in fiber and minimally processed items  - Typical grain sources include wheat, oats, barley, corn, brown rice, bulgar, farro, millet, polenta, couscous  - Various types of beans include chickpeas, lentils, fava beans, black beans, white beans   Fruits  Veggies - Large quantities of antioxidant rich fruits & veggies; 6 or more servings  - Vegetables can be eaten raw or lightly drizzled with oil and cooked  - Vegetables common to the traditional Mediterranean Diet include: artichokes, arugula, beets, broccoli, brussel sprouts, cabbage, carrots, celery, collard greens, cucumbers, eggplant, kale, leeks, lemons, lettuce, mushrooms, okra, onions, peas, peppers, potatoes, pumpkin, radishes, rutabaga, shallots, spinach, sweet potatoes, turnips, zucchini - Fruits common to the Mediterranean Diet include: apples, apricots, avocados, cherries, clementines, dates, figs, grapefruits, grapes, melons, nectarines, oranges, peaches, pears, pomegranates, strawberries, tangerines  Fats - Replace butter and margarine with healthy oils, such as olive oil, canola oil, and tahini  - Limit nuts to no more than a handful a day  - Nuts include walnuts, almonds,  pecans, pistachios, pine nuts  - Limit or avoid candied, honey roasted or heavily salted nuts - Olives are central to the [employer] - can be eaten whole or used in a variety of dishes   Meats Protein - Limiting red meat: no more than a few times a month - When eating red meat: choose lean cuts and keep the portion to the size of deck of cards - Eggs: approx. 0 to 4 times a week  - Fish and lean poultry: at least 2 a week  - Healthy protein sources include, chicken, [place], lean beef, lamb - Increase intake of seafood such as tuna, salmon, trout, mackerel, shrimp, scallops - Avoid or limit high fat processed meats such as sausage and bacon  Dairy - Include moderate amounts of low fat dairy products  - Focus on healthy dairy such as fat free yogurt, skim milk, low or reduced fat cheese - Limit dairy products higher in fat such as whole or 2% milk, cheese, ice cream  Alcohol - Moderate amounts of red wine is ok  - No more than 5 oz daily for women (all ages) and men older than age 81  - No more than 10 oz of wine daily for men younger than 64  Other - Limit  sweets and other desserts  - Use herbs and spices instead of salt to flavor foods  - Herbs and spices common to the traditional Mediterranean Diet include: basil, bay leaves, chives, cloves, cumin, fennel, garlic, lavender, marjoram, mint, oregano, parsley, pepper, rosemary, sage, savory, sumac, tarragon, thyme   It's not just a diet, it's a lifestyle:    The Mediterranean diet includes lifestyle factors typical of those in the region    Foods, drinks and meals are best eaten with others and savored   Daily physical activity is important for overall good health   This could be strenuous exercise like running and aerobics   This could also be more leisurely activities such as walking, housework, yard-work, or taking the stairs   Moderation is the key; a balanced and healthy diet accommodates most foods and drinks   Consider portion  sizes and frequency of consumption of certain foods   Meal Ideas & Options:    Breakfast:  o Whole wheat toast or whole wheat English muffins with peanut butter & hard boiled egg o Steel cut oats topped with apples & cinnamon and skim milk  o Fresh fruit: banana, strawberries, melon, berries, peaches  o Smoothies: strawberries, bananas, greek yogurt, peanut butter o Low fat greek yogurt with blueberries and granola  o Egg white omelet with spinach and mushrooms o Breakfast couscous: whole wheat couscous, apricots, skim milk, cranberries    Sandwiches:  o Hummus and grilled vegetables (peppers, zucchini, squash) on whole wheat bread   o Grilled chicken on whole wheat pita with lettuce, tomatoes, cucumbers or tzatziki  o Tuna salad on whole wheat bread: tuna salad made with greek yogurt, olives, red peppers, capers, green onions o Garlic rosemary lamb pita: lamb sauted with garlic, rosemary, salt & pepper; add lettuce, cucumber, greek yogurt to pita - flavor with lemon juice and black pepper    Seafood:  o Mediterranean grilled salmon, seasoned with garlic, basil, parsley, lemon juice and black pepper o Shrimp, lemon, and spinach whole-grain pasta salad made with low fat greek yogurt  o Seared scallops with lemon orzo  o Seared tuna steaks seasoned salt, pepper, coriander topped with tomato mixture of olives, tomatoes, olive oil, minced garlic, parsley, green onions and cappers    Meats:  o Herbed greek chicken salad with kalamata olives, cucumber, feta  o Red bell peppers stuffed with spinach, bulgur, lean ground beef (or lentils) & topped with feta   o Kebabs: skewers of chicken, tomatoes, onions, zucchini, squash  o [place] burgers: made with red onions, mint, dill, lemon juice, feta cheese topped with roasted red peppers   Vegetarian o Cucumber salad: cucumbers, artichoke hearts, celery, red onion, feta cheese, tossed in olive oil & lemon juice  o Hummus and whole grain pita points with  a greek salad (lettuce, tomato, feta, olives, cucumbers, red onion) o Lentil soup with celery, carrots made with vegetable broth, garlic, salt and pepper  o Tabouli salad: parsley, bulgur, mint, scallions, cucumbers, tomato, radishes, lemon juice, olive oil, salt and pepper. o

## 2014-02-01 ENCOUNTER — Ambulatory Visit (INDEPENDENT_AMBULATORY_CARE_PROVIDER_SITE_OTHER): Payer: Federal, State, Local not specified - PPO

## 2014-02-01 ENCOUNTER — Ambulatory Visit (INDEPENDENT_AMBULATORY_CARE_PROVIDER_SITE_OTHER): Payer: Federal, State, Local not specified - PPO | Admitting: Family Medicine

## 2014-02-01 VITALS — BP 132/78 | HR 77 | Temp 98.4°F | Resp 16 | Ht 66.0 in | Wt 164.0 lb

## 2014-02-01 DIAGNOSIS — R062 Wheezing: Secondary | ICD-10-CM

## 2014-02-01 DIAGNOSIS — R059 Cough, unspecified: Secondary | ICD-10-CM

## 2014-02-01 DIAGNOSIS — J22 Unspecified acute lower respiratory infection: Secondary | ICD-10-CM

## 2014-02-01 DIAGNOSIS — R05 Cough: Secondary | ICD-10-CM

## 2014-02-01 DIAGNOSIS — J988 Other specified respiratory disorders: Secondary | ICD-10-CM

## 2014-02-01 MED ORDER — ALBUTEROL SULFATE HFA 108 (90 BASE) MCG/ACT IN AERS: 1.0000 | INHALATION_SPRAY | RESPIRATORY_TRACT | Status: DC | PRN

## 2014-02-01 MED ORDER — BENZONATATE 100 MG PO CAPS: 100.0000 mg | ORAL_CAPSULE | Freq: Three times a day (TID) | ORAL | Status: DC | PRN

## 2014-02-01 MED ORDER — ALBUTEROL SULFATE (2.5 MG/3ML) 0.083% IN NEBU
2.5000 mg | INHALATION_SOLUTION | Freq: Once | RESPIRATORY_TRACT | Status: AC
  Administered 2014-02-01: 2.5 mg via RESPIRATORY_TRACT

## 2014-02-01 MED ORDER — AZITHROMYCIN 250 MG PO TABS: ORAL_TABLET | ORAL | Status: DC

## 2014-02-01 NOTE — Patient Instructions (Signed)
Start antibiotic for possible early pneumonia, or at least bronchitis. mucinex over the counter (if not increasing shortness of breath or wheeze).  Tessalon 3 times per day if needed to help suppress cough, and if wheezing/short of breath - albuterol inhaler as discussed.   Return to the clinic or go to the nearest emergency room if any of your symptoms worsen or new symptoms occur.  Bronchospasm A bronchospasm is when the tubes that carry air in and out of your lungs (airways) spasm or tighten. During a bronchospasm it is hard to breathe. This is because the airways get smaller. A bronchospasm can be triggered by:  Allergies. These may be to animals, pollen, food, or mold.  Infection. This is a common cause of bronchospasm.  Exercise.  Irritants. These include pollution, cigarette smoke, strong odors, aerosol sprays, and paint fumes.  Weather changes.  Stress.  Being emotional. HOME CARE   Always have a plan for getting help. Know when to call your doctor and local emergency services (911 in the [place] Know where you can get emergency care.  Only take medicines as told by your doctor.  If you were prescribed an inhaler or nebulizer machine, ask your doctor how to use it correctly. Always use a spacer with your inhaler if you were given one.  Stay calm during an attack. Try to relax and breathe more slowly.  Control your home environment:  Change your heating and air conditioning filter at least once a month.  Limit your use of fireplaces and wood stoves.  Do not  smoke. Do not  allow smoking in your home.  Avoid perfumes and fragrances.  Get rid of pests (such as roaches and mice) and their droppings.  Throw away plants if you see mold on them.  Keep your house clean and dust free.  Replace carpet with wood, tile, or vinyl flooring. Carpet can trap dander and dust.  Use allergy-proof pillows, mattress covers, and box spring covers.  Wash bed sheets and blankets  every week in hot water. Dry them in a dryer.  Use blankets that are made of polyester or cotton.  Wash hands frequently. GET HELP IF:  You have muscle aches.  You have chest pain.  The thick spit you spit or cough up (sputum) changes from clear or white to yellow, green, gray, or bloody.  The thick spit you spit or cough up gets thicker.  There are problems that may be related to the medicine you are given such as:  A rash.  Itching.  Swelling.  Trouble breathing. GET HELP RIGHT AWAY IF:  You feel you cannot breathe or catch your breath.  You cannot stop coughing.  Your treatment is not helping you breathe better.  You have very bad chest pain. MAKE SURE YOU:   Understand these instructions.  Will watch your condition.  Will get help right away if you are not doing well or get worse. Document Released: 01/22/2009 Document Revised: 04/01/2013 Document Reviewed: 09/17/2012 [facility] Patient Information 2015 [place], [place]. This information is not intended to replace advice given to you by your health care provider. Make sure you discuss any questions you have with your health care provider.   Acute Bronchitis Bronchitis is inflammation of the airways that extend from the windpipe into the lungs (bronchi). The inflammation often causes mucus to develop. This leads to a cough, which is the most common symptom of bronchitis.  In acute bronchitis, the condition usually develops suddenly and goes away over  time, usually in a couple weeks. Smoking, allergies, and asthma can make bronchitis worse. Repeated episodes of bronchitis may cause further lung problems.  CAUSES Acute bronchitis is most often caused by the same virus that causes a cold. The virus can spread from person to person (contagious) through coughing, sneezing, and touching contaminated objects. SIGNS AND SYMPTOMS   Cough.   Fever.   Coughing up mucus.   Body aches.   Chest congestion.    Chills.   Shortness of breath.   Sore throat.  DIAGNOSIS  Acute bronchitis is usually diagnosed through a physical exam. Your health care provider will also ask you questions about your medical history. Tests, such as chest X-rays, are sometimes done to rule out other conditions.  TREATMENT  Acute bronchitis usually goes away in a couple weeks. Oftentimes, no medical treatment is necessary. Medicines are sometimes given for relief of fever or cough. Antibiotic medicines are usually not needed but may be prescribed in certain situations. In some cases, an inhaler may be recommended to help reduce shortness of breath and control the cough. A cool mist vaporizer may also be used to help thin bronchial secretions and make it easier to clear the chest.  HOME CARE INSTRUCTIONS  Get plenty of rest.   Drink enough fluids to keep your urine clear or pale yellow (unless you have a medical condition that requires fluid restriction). Increasing fluids may help thin your respiratory secretions (sputum) and reduce chest congestion, and it will prevent dehydration.   Take medicines only as directed by your health care provider.  If you were prescribed an antibiotic medicine, finish it all even if you start to feel better.  Avoid smoking and secondhand smoke. Exposure to cigarette smoke or irritating chemicals will make bronchitis worse. If you are a smoker, consider using nicotine gum or skin patches to help control withdrawal symptoms. Quitting smoking will help your lungs heal faster.   Reduce the chances of another bout of acute bronchitis by washing your hands frequently, avoiding people with cold symptoms, and trying not to touch your hands to your mouth, nose, or eyes.   Keep all follow-up visits as directed by your health care provider.  SEEK MEDICAL CARE IF: Your symptoms do not improve after 1 week of treatment.  SEEK IMMEDIATE MEDICAL CARE IF:  You develop an increased fever or  chills.   You have chest pain.   You have severe shortness of breath.  You have bloody sputum.   You develop dehydration.  You faint or repeatedly feel like you are going to pass out.  You develop repeated vomiting.  You develop a severe headache. MAKE SURE YOU:   Understand these instructions.  Will watch your condition.  Will get help right away if you are not doing well or get worse. Document Released: 05/04/2004 Document Revised: 08/11/2013 Document Reviewed: 09/17/2012 [facility] Patient Information 2015 [place], [place]. This information is not intended to replace advice given to you by your health care provider. Make sure you discuss any questions you have with your health care provider.

## 2014-02-01 NOTE — Progress Notes (Signed)
Subjective:    Patient ID: Michele Lowery, female    DOB: 1956-04-08, 58 y.o.   MRN: [id number]  Cough Associated symptoms include shortness of breath. Pertinent negatives include no chills or fever.   Chief Complaint  Patient presents with  . Cough    x 1 week.  non productive.  nasal congestion with cold x 2 weeks.  chest discomfort from coughing   This chart was scribed for Michele Ray, MD by Thea Alken, ED Scribe. This patient was seen in room 12 and the patient's care was started at 1:51 PM.  HPI Comments: Michele Lowery is a 58 y.o. female who presents to the [facility] complaining of a non productive cough that began 1 week ago. Pt reports sympoms began with a head cold that progressed into a worsening cough throughout the week with associated nasal congestion. Pt reports improvement with head cold but states symptoms have moved to chest, causing chest tightness.  Pt reports SOB with cough but not with activity. Pt has tried tylenol and alka seltzer gel tabs without relief. She reports exposure to similar symptoms. Pt denies fever and chills. Pt denies hx of asthma and COPD. Pt denies being a smoker or smoker at home. Pt denies heart problems.  Patient Active Problem List   Diagnosis Date Noted  . Peripheral neuropathy 12/17/2013  . Rosacea   . HLD (hyperlipidemia)   . HTN (hypertension)   . Depression   . ? Hernia of abdominal wall - epigastrium 10/17/2010    Class: Question of  . Heartburn 10/17/2010  . Overweight (BMI 25.0-29.9) 10/17/2010   Past Medical History  Diagnosis Date  . Vertigo   . Rosacea   . HLD (hyperlipidemia)   . HTN (hypertension)   . Internal hemorrhoids 11/13/08  . Hx: UTI (urinary tract infection)   . Migraine headache 05/07/2011    h/o atypical migraines  . Depression    Past Surgical History  Procedure Laterality Date  . Colonoscopy  11/13/2008    internal hemrrhoids   Allergies  Allergen Reactions  . Amlodipine     . Desipramine   . Doxycycline Nausea And Vomiting    REACTION: nausea and vomiting  . Lisinopril Cough  . Sulfonamide Derivatives     REACTION: "i can't remember the reaction"   Prior to Admission medications   Medication Sig Start Date End Date Taking? Authorizing Provider  aspirin 81 MG tablet Take 81 mg by mouth daily.     Yes Historical Provider, MD  b complex vitamins tablet Take 1 tablet by mouth daily.   Yes Historical Provider, MD  cholecalciferol (VITAMIN D) 400 UNITS TABS tablet Take 400 Units by mouth.   Yes Historical Provider, MD  FLUoxetine (PROZAC) 40 MG capsule TAKE 1 CAPSULE (40 MG TOTAL) BY MOUTH DAILY. 07/20/13  Yes Ryan M Dunn, PA-C  losartan-hydrochlorothiazide (HYZAAR) 100-12.5 MG per tablet TAKE 1 TABLET BY MOUTH DAILY. 12/17/13  Yes Barton Fanny, MD  Omega-3 Fatty Acids (FISH OIL PO) Take 4 tablets by mouth daily.    Yes Historical Provider, MD  omeprazole (PRILOSEC) 20 MG capsule TAKE 1 CAPSULE (20 MG TOTAL) BY MOUTH DAILY.   Yes Marsh Dolly Marte, PA-C  pravastatin (PRAVACHOL) 40 MG tablet TAKE 1 TABLET BY MOUTH EVERY DAY   Yes Barton Fanny, MD  sucralfate (CARAFATE) 1 G tablet TAKE 1 TABLET BY MOUTH ONCE DAILY 10/23/13  Yes Chelle Janalee Dane, PA-C  zoster vaccine live,  PF, (ZOSTAVAX) 56314 UNT/0.65ML injection Inject 19,400 Units into the skin once. 12/17/13  Yes Barton Fanny, MD   History   Social History  . Marital Status: Married    Spouse Name: N/A    Number of Children: 2  . Years of Education: N/A   Occupational History  . Control and instrumentation engineer    Social History Main Topics  . Smoking status: Former Smoker -- 0.50 packs/day for 4 years    Types: Cigarettes    Quit date: 07/09/1976  . Smokeless tobacco: Never Used  . Alcohol Use: 0.5 oz/week    1 drink(s) per week     Comment: 1-2 per month   . Drug Use: No  . Sexual Activity: No   Other Topics Concern  . Not on file   Social History Narrative   1 caffeine drink daily     Review of Systems  Constitutional: Negative for fever and chills.  HENT: Positive for congestion.   Respiratory: Positive for cough, chest tightness and shortness of breath.        Objective:   Physical Exam  Vitals reviewed. Constitutional: She is oriented to person, place, and time. She appears well-developed and well-nourished. No distress.  HENT:  Head: Normocephalic and atraumatic.  Right Ear: Hearing, tympanic membrane, external ear and ear canal normal.  Left Ear: Hearing, tympanic membrane, external ear and ear canal normal.  Nose: Nose normal.  Mouth/Throat: Oropharynx is clear and moist. No oropharyngeal exudate.  Eyes: Conjunctivae and EOM are normal. Pupils are equal, round, and reactive to light.  Cardiovascular: Normal rate, regular rhythm, normal heart sounds and intact distal pulses.   No murmur heard. Pulmonary/Chest: Effort normal. No respiratory distress. She has wheezes ( end expiratory). She has no rhonchi.  Few coarse breath sounds diffusely.  Neurological: She is alert and oriented to person, place, and time.  Skin: Skin is warm and dry. No rash noted.  Psychiatric: She has a normal mood and affect. Her behavior is normal.   Filed Vitals:   02/01/14 1259  BP: 132/78  Pulse: 77  Temp: 98.4 F (36.9 C)  TempSrc: Oral  Resp: 16  Height: 5\' 6"  (1.676 m)  Weight: 164 lb (74.39 kg)  SpO2: 94%   [facility] reading (PRIMARY) by Dr. Carlota Raspberry. Increased left lower lobe marking versus early infiltrate with few right lower lobe markings   Assessment & Plan:   Michele Lowery is a 58 y.o. female Cough - Plan: DG Chest 2 View, benzonatate (TESSALON) 100 MG capsule  Wheezing - Plan: albuterol (PROVENTIL) (2.5 MG/3ML) 0.083% nebulizer solution 2.5 mg, albuterol (PROVENTIL HFA;VENTOLIN HFA) 108 (90 BASE) MCG/ACT inhaler  LRTI (lower respiratory tract infection) - Plan: benzonatate (TESSALON) 100 MG capsule, azithromycin (ZITHROMAX) 250 MG tablet  Bronchitis vs early  RLL pna, with slight bronchospasm (improved aeration after albuterol neb). Start zpak, mucinex (unless increases wheeze), tessalon if needed, and albuterol if needed for wheeze. SED.  Rtc/er precautions.   Meds ordered this encounter  Medications  . albuterol (PROVENTIL) (2.5 MG/3ML) 0.083% nebulizer solution 2.5 mg    Sig:   . benzonatate (TESSALON) 100 MG capsule    Sig: Take 1 capsule (100 mg total) by mouth 3 (three) times daily as needed for cough.    Dispense:  20 capsule    Refill:  0  . azithromycin (ZITHROMAX) 250 MG tablet    Sig: Take 2 pills by mouth on day 1, then 1 pill by mouth per day on  days 2 through 5.    Dispense:  6 tablet    Refill:  0  . albuterol (PROVENTIL HFA;VENTOLIN HFA) 108 (90 BASE) MCG/ACT inhaler    Sig: Inhale 1-2 puffs into the lungs every 4 (four) hours as needed for wheezing or shortness of breath.    Dispense:  1 Inhaler    Refill:  0   Patient Instructions  Start antibiotic for possible early pneumonia, or at least bronchitis. mucinex over the counter (if not increasing shortness of breath or wheeze).  Tessalon 3 times per day if needed to help suppress cough, and if wheezing/short of breath - albuterol inhaler as discussed.   Return to the clinic or go to the nearest emergency room if any of your symptoms worsen or new symptoms occur.  Bronchospasm A bronchospasm is when the tubes that carry air in and out of your lungs (airways) spasm or tighten. During a bronchospasm it is hard to breathe. This is because the airways get smaller. A bronchospasm can be triggered by:  Allergies. These may be to animals, pollen, food, or mold.  Infection. This is a common cause of bronchospasm.  Exercise.  Irritants. These include pollution, cigarette smoke, strong odors, aerosol sprays, and paint fumes.  Weather changes.  Stress.  Being emotional. HOME CARE   Always have a plan for getting help. Know when to call your doctor and local emergency services  (911 in the [place] Know where you can get emergency care.  Only take medicines as told by your doctor.  If you were prescribed an inhaler or nebulizer machine, ask your doctor how to use it correctly. Always use a spacer with your inhaler if you were given one.  Stay calm during an attack. Try to relax and breathe more slowly.  Control your home environment:  Change your heating and air conditioning filter at least once a month.  Limit your use of fireplaces and wood stoves.  Do not  smoke. Do not  allow smoking in your home.  Avoid perfumes and fragrances.  Get rid of pests (such as roaches and mice) and their droppings.  Throw away plants if you see mold on them.  Keep your house clean and dust free.  Replace carpet with wood, tile, or vinyl flooring. Carpet can trap dander and dust.  Use allergy-proof pillows, mattress covers, and box spring covers.  Wash bed sheets and blankets every week in hot water. Dry them in a dryer.  Use blankets that are made of polyester or cotton.  Wash hands frequently. GET HELP IF:  You have muscle aches.  You have chest pain.  The thick spit you spit or cough up (sputum) changes from clear or white to yellow, green, gray, or bloody.  The thick spit you spit or cough up gets thicker.  There are problems that may be related to the medicine you are given such as:  A rash.  Itching.  Swelling.  Trouble breathing. GET HELP RIGHT AWAY IF:  You feel you cannot breathe or catch your breath.  You cannot stop coughing.  Your treatment is not helping you breathe better.  You have very bad chest pain. MAKE SURE YOU:   Understand these instructions.  Will watch your condition.  Will get help right away if you are not doing well or get worse. Document Released: 01/22/2009 Document Revised: 04/01/2013 Document Reviewed: 09/17/2012 [facility] Patient Information 2015 [place], [place]. This information is not intended to replace  advice given to you by  your health care provider. Make sure you discuss any questions you have with your health care provider.   Acute Bronchitis Bronchitis is inflammation of the airways that extend from the windpipe into the lungs (bronchi). The inflammation often causes mucus to develop. This leads to a cough, which is the most common symptom of bronchitis.  In acute bronchitis, the condition usually develops suddenly and goes away over time, usually in a couple weeks. Smoking, allergies, and asthma can make bronchitis worse. Repeated episodes of bronchitis may cause further lung problems.  CAUSES Acute bronchitis is most often caused by the same virus that causes a cold. The virus can spread from person to person (contagious) through coughing, sneezing, and touching contaminated objects. SIGNS AND SYMPTOMS   Cough.   Fever.   Coughing up mucus.   Body aches.   Chest congestion.   Chills.   Shortness of breath.   Sore throat.  DIAGNOSIS  Acute bronchitis is usually diagnosed through a physical exam. Your health care provider will also ask you questions about your medical history. Tests, such as chest X-rays, are sometimes done to rule out other conditions.  TREATMENT  Acute bronchitis usually goes away in a couple weeks. Oftentimes, no medical treatment is necessary. Medicines are sometimes given for relief of fever or cough. Antibiotic medicines are usually not needed but may be prescribed in certain situations. In some cases, an inhaler may be recommended to help reduce shortness of breath and control the cough. A cool mist vaporizer may also be used to help thin bronchial secretions and make it easier to clear the chest.  HOME CARE INSTRUCTIONS  Get plenty of rest.   Drink enough fluids to keep your urine clear or pale yellow (unless you have a medical condition that requires fluid restriction). Increasing fluids may help thin your respiratory secretions (sputum) and  reduce chest congestion, and it will prevent dehydration.   Take medicines only as directed by your health care provider.  If you were prescribed an antibiotic medicine, finish it all even if you start to feel better.  Avoid smoking and secondhand smoke. Exposure to cigarette smoke or irritating chemicals will make bronchitis worse. If you are a smoker, consider using nicotine gum or skin patches to help control withdrawal symptoms. Quitting smoking will help your lungs heal faster.   Reduce the chances of another bout of acute bronchitis by washing your hands frequently, avoiding people with cold symptoms, and trying not to touch your hands to your mouth, nose, or eyes.   Keep all follow-up visits as directed by your health care provider.  SEEK MEDICAL CARE IF: Your symptoms do not improve after 1 week of treatment.  SEEK IMMEDIATE MEDICAL CARE IF:  You develop an increased fever or chills.   You have chest pain.   You have severe shortness of breath.  You have bloody sputum.   You develop dehydration.  You faint or repeatedly feel like you are going to pass out.  You develop repeated vomiting.  You develop a severe headache. MAKE SURE YOU:   Understand these instructions.  Will watch your condition.  Will get help right away if you are not doing well or get worse. Document Released: 05/04/2004 Document Revised: 08/11/2013 Document Reviewed: 09/17/2012 [facility] Patient Information 2015 [place], [place]. This information is not intended to replace advice given to you by your health care provider. Make sure you discuss any questions you have with your health care provider.     I  personally performed the services described in this documentation, which was scribed in my presence. The recorded information has been reviewed and considered, and addended by me as needed.

## 2014-02-07 ENCOUNTER — Other Ambulatory Visit: Payer: Self-pay | Admitting: Family Medicine

## 2014-02-08 NOTE — Telephone Encounter (Signed)
Patient requesting her medication "Benzonatate" to please be sent in as soon as possible to the pharmacy. Per patient she still has a bad cough and just took her last pill today. Please call in to [facility] on [phone] [facility]. Patients call back number is [phone]. I informed patient Dr Carlota Raspberry was not in today. Patient requesting another doctor to review it today if possible.

## 2014-02-09 NOTE — Telephone Encounter (Signed)
Notified pt on VM. 

## 2014-02-09 NOTE — Telephone Encounter (Signed)
Done  Sent to pharmacy

## 2014-02-09 NOTE — Telephone Encounter (Signed)
Do you want to refill this for pt?

## 2014-02-13 ENCOUNTER — Encounter: Payer: Self-pay | Admitting: Family Medicine

## 2014-02-14 ENCOUNTER — Ambulatory Visit (INDEPENDENT_AMBULATORY_CARE_PROVIDER_SITE_OTHER): Payer: Federal, State, Local not specified - PPO | Admitting: Family Medicine

## 2014-02-14 ENCOUNTER — Ambulatory Visit (INDEPENDENT_AMBULATORY_CARE_PROVIDER_SITE_OTHER): Payer: Federal, State, Local not specified - PPO

## 2014-02-14 VITALS — BP 130/80 | HR 67 | Temp 98.6°F | Resp 16 | Ht 66.0 in | Wt 165.0 lb

## 2014-02-14 DIAGNOSIS — R0789 Other chest pain: Secondary | ICD-10-CM

## 2014-02-14 DIAGNOSIS — R05 Cough: Secondary | ICD-10-CM

## 2014-02-14 DIAGNOSIS — R059 Cough, unspecified: Secondary | ICD-10-CM

## 2014-02-14 DIAGNOSIS — J9801 Acute bronchospasm: Secondary | ICD-10-CM

## 2014-02-14 DIAGNOSIS — R0600 Dyspnea, unspecified: Secondary | ICD-10-CM

## 2014-02-14 LAB — POCT CBC
GRANULOCYTE PERCENT: 64.1 % (ref 37–80)
HEMATOCRIT: 41.4 % (ref 37.7–47.9)
Hemoglobin: 13.5 g/dL (ref 12.2–16.2)
LYMPH, POC: 2.4 (ref 0.6–3.4)
MCH, POC: 28.8 pg (ref 27–31.2)
MCHC: 32.6 g/dL (ref 31.8–35.4)
MCV: 88.5 fL (ref 80–97)
MID (cbc): 0.3 (ref 0–0.9)
MPV: 8.2 fL (ref 0–99.8)
POC Granulocyte: 4.7 (ref 2–6.9)
POC LYMPH PERCENT: 32.5 %L (ref 10–50)
POC MID %: 3.4 %M (ref 0–12)
Platelet Count, POC: 385 10*3/uL (ref 142–424)
RBC: 4.67 M/uL (ref 4.04–5.48)
RDW, POC: 12.6 %
WBC: 7.4 10*3/uL (ref 4.6–10.2)

## 2014-02-14 LAB — D-DIMER, QUANTITATIVE: D-Dimer, Quant: 0.27 ug/mL-FEU (ref 0.00–0.48)

## 2014-02-14 MED ORDER — BENZONATATE 100 MG PO CAPS: ORAL_CAPSULE | ORAL | Status: DC

## 2014-02-14 MED ORDER — PREDNISONE 20 MG PO TABS: 40.0000 mg | ORAL_TABLET | Freq: Every day | ORAL | Status: DC

## 2014-02-14 NOTE — Patient Instructions (Addendum)
Your xray appears better.  May be persistent wheezing and inflammation causing cough and lower oxygen.  Start prednisone, can use albuterol every 4-6 hours as needed, and will let you know if other testing needed once blood test returns later today. If not improving in next 4-5 days, recheck. Return to the clinic or go to the nearest emergency room if any of your symptoms worsen or new symptoms occur.  Bronchospasm A bronchospasm is a spasm or tightening of the airways going into the lungs. During a bronchospasm breathing becomes more difficult because the airways get smaller. When this happens there can be coughing, a whistling sound when breathing (wheezing), and difficulty breathing. Bronchospasm is often associated with asthma, but not all patients who experience a bronchospasm have asthma. CAUSES  A bronchospasm is caused by inflammation or irritation of the airways. The inflammation or irritation may be triggered by:   Allergies (such as to animals, pollen, food, or mold). Allergens that cause bronchospasm may cause wheezing immediately after exposure or many hours later.   Infection. Viral infections are believed to be the most common cause of bronchospasm.   Exercise.   Irritants (such as pollution, cigarette smoke, strong odors, aerosol sprays, and paint fumes).   Weather changes. Winds increase molds and pollens in the air. Rain refreshes the air by washing irritants out. Cold air may cause inflammation.   Stress and emotional upset.  SIGNS AND SYMPTOMS   Wheezing.   Excessive nighttime coughing.   Frequent or severe coughing with a simple cold.   Chest tightness.   Shortness of breath.  DIAGNOSIS  Bronchospasm is usually diagnosed through a history and physical exam. Tests, such as chest X-rays, are sometimes done to look for other conditions. TREATMENT   Inhaled medicines can be given to open up your airways and help you breathe. The medicines can be given  using either an inhaler or a nebulizer machine.  Corticosteroid medicines may be given for severe bronchospasm, usually when it is associated with asthma. HOME CARE INSTRUCTIONS   Always have a plan prepared for seeking medical care. Know when to call your health care provider and local emergency services (911 in the [place] Know where you can access local emergency care.  Only take medicines as directed by your health care provider.  If you were prescribed an inhaler or nebulizer machine, ask your health care provider to explain how to use it correctly. Always use a spacer with your inhaler if you were given one.  It is necessary to remain calm during an attack. Try to relax and breathe more slowly.  Control your home environment in the following ways:   Change your heating and air conditioning filter at least once a month.   Limit your use of fireplaces and wood stoves.  Do not smoke and do not allow smoking in your home.   Avoid exposure to perfumes and fragrances.   Get rid of pests (such as roaches and mice) and their droppings.   Throw away plants if you see mold on them.   Keep your house clean and dust free.   Replace carpet with wood, tile, or vinyl flooring. Carpet can trap dander and dust.   Use allergy-proof pillows, mattress covers, and box spring covers.   Wash bed sheets and blankets every week in hot water and dry them in a dryer.   Use blankets that are made of polyester or cotton.   Wash hands frequently. SEEK MEDICAL CARE IF:   You  have muscle aches.   You have chest pain.   The sputum changes from clear or white to yellow, green, gray, or bloody.   The sputum you cough up gets thicker.   There are problems that may be related to the medicine you are given, such as a rash, itching, swelling, or trouble breathing.  SEEK IMMEDIATE MEDICAL CARE IF:   You have worsening wheezing and coughing even after taking your prescribed medicines.    You have increased difficulty breathing.   You develop severe chest pain. MAKE SURE YOU:   Understand these instructions.  Will watch your condition.  Will get help right away if you are not doing well or get worse. Document Released: 03/30/2003 Document Revised: 04/01/2013 Document Reviewed: 09/16/2012 [facility] Patient Information 2015 [place], [place]. This information is not intended to replace advice given to you by your health care provider. Make sure you discuss any questions you have with your health care provider.   Cough, Adult  A cough is a reflex that helps clear your throat and airways. It can help heal the body or may be a reaction to an irritated airway. A cough may only last 2 or 3 weeks (acute) or may last more than 8 weeks (chronic).  CAUSES Acute cough:  Viral or bacterial infections. Chronic cough:  Infections.  Allergies.  Asthma.  Post-nasal drip.  Smoking.  Heartburn or acid reflux.  Some medicines.  Chronic lung problems (COPD).  Cancer. SYMPTOMS   Cough.  Fever.  Chest pain.  Increased breathing rate.  High-pitched whistling sound when breathing (wheezing).  Colored mucus that you cough up (sputum). TREATMENT   A bacterial cough may be treated with antibiotic medicine.  A viral cough must run its course and will not respond to antibiotics.  Your caregiver may recommend other treatments if you have a chronic cough. HOME CARE INSTRUCTIONS   Only take over-the-counter or prescription medicines for pain, discomfort, or fever as directed by your caregiver. Use cough suppressants only as directed by your caregiver.  Use a cold steam vaporizer or humidifier in your bedroom or home to help loosen secretions.  Sleep in a semi-upright position if your cough is worse at night.  Rest as needed.  Stop smoking if you smoke. SEEK IMMEDIATE MEDICAL CARE IF:   You have pus in your sputum.  Your cough starts to worsen.  You cannot  control your cough with suppressants and are losing sleep.  You begin coughing up blood.  You have difficulty breathing.  You develop pain which is getting worse or is uncontrolled with medicine.  You have a fever. MAKE SURE YOU:   Understand these instructions.  Will watch your condition.  Will get help right away if you are not doing well or get worse. Document Released: 09/23/2010 Document Revised: 06/19/2011 Document Reviewed: 09/23/2010 [facility] Patient Information 2015 [place], [place]. This information is not intended to replace advice given to you by your health care provider. Make sure you discuss any questions you have with your health care provider.

## 2014-02-14 NOTE — Progress Notes (Addendum)
This chart was scribed for Michele Ray, MD by Einar Pheasant, ED Scribe. This patient was seen in room 9 and the patient's care was started at 9:24 AM.  Subjective:    Patient ID: Michele Lowery, female    DOB: 06-18-1955, 58 y.o.   MRN: [id number]  Chief Complaint  Patient presents with  . Shortness of Breath    Not feeling better since last visit    HPI Michele Lowery is a 58 y.o. female who was last seen by me 02/01/14 almost 2 weeks ago acute bronchitis and bronchospasm versus early right lobe pneumonia. CXR had mild bronchitic changes and left basilar atelectasis. She was treated with albuterol neb 1 time in the office and started on Z-pak, Mucinex if not wheezing, tessalon perles and albuterol if needed for wheeze. Pt email yesterday concerned for almost passing out when trying to work out and home oxygen between 92-93%.   Pt is here today for a follow up. Pt states that she has been treating her cough with the tessalon perles and albuterol inhaler. She states that the pearls turn the dry cough into a wet cough allowing her to get rid of the congestion inside. She states that she thought that she was feeling better so she scheduled an appointment at the gym. But 15 minutes into her work out she started feeling light headed so she came into the office yesterday. However, there was a long wait here so she decided to go home and return this morning. She states that she continued to monitor her oxygen level at home. Pt reports that her last albuterol inhaler usage was 3 days ago.   She is also complaining of associated chest pain. Pt describes her current chest pain as a "burning" sensation. She denies any hx of recent travels or calf pain/swelling. Pt denies any hx of heart problems. However, she states that she had an echo done several years back. No adverse findings reported according to her.  Denies any fever, chills, nausea, emesis, or headaches.   Patient Active Problem List   Diagnosis Date Noted  . Peripheral neuropathy 12/17/2013  . Rosacea   . HLD (hyperlipidemia)   . HTN (hypertension)   . Depression   . ? Hernia of abdominal wall - epigastrium 10/17/2010    Class: Question of  . Heartburn 10/17/2010  . Overweight (BMI 25.0-29.9) 10/17/2010   Past Medical History  Diagnosis Date  . Vertigo   . Rosacea   . HLD (hyperlipidemia)   . HTN (hypertension)   . Internal hemorrhoids 11/13/08  . Hx: UTI (urinary tract infection)   . Migraine headache 05/07/2011    h/o atypical migraines  . Depression    Past Surgical History  Procedure Laterality Date  . Colonoscopy  11/13/2008    internal hemrrhoids   Allergies  Allergen Reactions  . Amlodipine   . Desipramine   . Doxycycline Nausea And Vomiting    REACTION: nausea and vomiting  . Lisinopril Cough  . Sulfonamide Derivatives     REACTION: "i can't remember the reaction"   Prior to Admission medications   Medication Sig Start Date End Date Taking? Authorizing Provider  albuterol (PROVENTIL HFA;VENTOLIN HFA) 108 (90 BASE) MCG/ACT inhaler Inhale 1-2 puffs into the lungs every 4 (four) hours as needed for wheezing or shortness of breath. 02/01/14  Yes Wendie Agreste, MD  aspirin 81 MG tablet Take 81 mg by mouth daily.     Yes Historical Provider, MD  azithromycin (ZITHROMAX) 250 MG tablet Take 2 pills by mouth on day 1, then 1 pill by mouth per day on days 2 through 5. 02/01/14  Yes Wendie Agreste, MD  b complex vitamins tablet Take 1 tablet by mouth daily.   Yes Historical Provider, MD  benzonatate (TESSALON) 100 MG capsule TAKE 1 CAPSULE (100 MG TOTAL) BY MOUTH 3 (THREE) TIMES DAILY AS NEEDED FOR COUGH. 02/09/14  Yes Wendie Agreste, MD  cholecalciferol (VITAMIN D) 400 UNITS TABS tablet Take 400 Units by mouth.   Yes Historical Provider, MD  FLUoxetine (PROZAC) 40 MG capsule TAKE 1 CAPSULE (40 MG TOTAL) BY MOUTH DAILY. 07/20/13  Yes Ryan M Dunn, PA-C  losartan-hydrochlorothiazide (HYZAAR) 100-12.5 MG  per tablet TAKE 1 TABLET BY MOUTH DAILY. 12/17/13  Yes Barton Fanny, MD  Omega-3 Fatty Acids (FISH OIL PO) Take 4 tablets by mouth daily.    Yes Historical Provider, MD  omeprazole (PRILOSEC) 20 MG capsule TAKE 1 CAPSULE (20 MG TOTAL) BY MOUTH DAILY.   Yes Heather M Marte, PA-C  pravastatin (PRAVACHOL) 40 MG tablet TAKE 1 TABLET BY MOUTH EVERY DAY   Yes Barton Fanny, MD  sucralfate (CARAFATE) 1 G tablet TAKE 1 TABLET BY MOUTH ONCE DAILY 10/23/13  Yes Chelle S Jeffery, PA-C  zoster vaccine live, PF, (ZOSTAVAX) 62130 UNT/0.65ML injection Inject 19,400 Units into the skin once. 12/17/13  Yes Barton Fanny, MD   History   Social History  . Marital Status: Married    Spouse Name: N/A    Number of Children: 2  . Years of Education: N/A   Occupational History  . Control and instrumentation engineer    Social History Main Topics  . Smoking status: Former Smoker -- 0.50 packs/day for 4 years    Types: Cigarettes    Quit date: 07/09/1976  . Smokeless tobacco: Never Used  . Alcohol Use: 0.5 oz/week    1 drink(s) per week     Comment: 1-2 per month   . Drug Use: No  . Sexual Activity: No   Other Topics Concern  . Not on file   Social History Narrative   1 caffeine drink daily    Review of Systems  Constitutional: Negative for chills, fatigue and unexpected weight change.  HENT: Positive for congestion.   Respiratory: Positive for cough. Negative for chest tightness and shortness of breath.   Cardiovascular: Positive for chest pain. Negative for palpitations and leg swelling.  Gastrointestinal: Negative for abdominal pain and blood in stool.  Neurological: Negative for dizziness, syncope, light-headedness and headaches.      Peak flow reading is 300, about 71 % of predicted.  Objective:   Physical Exam  Constitutional: She is oriented to person, place, and time. She appears well-developed and well-nourished.  HENT:  Head: Normocephalic and atraumatic.  Right Ear: External ear  normal.  Left Ear: External ear normal.  Nose: Nose normal.  Mouth/Throat: Oropharynx is clear and moist. No oropharyngeal exudate.  Eyes: Conjunctivae and EOM are normal. Pupils are equal, round, and reactive to light.  Neck: Normal range of motion. Neck supple. Carotid bruit is not present. No thyromegaly present.  Cardiovascular: Normal rate, regular rhythm, normal heart sounds and intact distal pulses.  Exam reveals no gallop and no friction rub.   No murmur heard. Pulmonary/Chest: Effort normal and breath sounds normal. No respiratory distress. She has no wheezes. She has no rales.  Coarse breath sounds in the left lower lobe greater than the right.  No audible wheeze.  Abdominal: Soft. She exhibits no pulsatile midline mass. There is no tenderness.  Lymphadenopathy:    She has no cervical adenopathy.  Neurological: She is alert and oriented to person, place, and time.  Skin: Skin is warm and dry.  Psychiatric: She has a normal mood and affect. Her behavior is normal.  Vitals reviewed.   Filed Vitals:   02/14/14 0831  BP: 130/80  Pulse: 79  Temp: 98.6 F (37 C)  TempSrc: Oral  Resp: 16  Height: 5\' 6"  (1.676 m)  Weight: 165 lb (74.844 kg)  SpO2: 92%    [facility] reading (PRIMARY) by  Dr. Carlota Raspberry: CXR: interval improvement in LLL markings, no discrete infiltrate.   Results for orders placed or performed in visit on 02/14/14  POCT CBC  Result Value Ref Range   WBC 7.4 4.6 - 10.2 K/uL   Lymph, poc 2.4 0.6 - 3.4   POC LYMPH PERCENT 32.5 10 - 50 %L   MID (cbc) 0.3 0 - 0.9   POC MID % 3.4 0 - 12 %M   POC Granulocyte 4.7 2 - 6.9   Granulocyte percent 64.1 37 - 80 %G   RBC 4.67 4.04 - 5.48 M/uL   Hemoglobin 13.5 12.2 - 16.2 g/dL   HCT, POC 41.4 37.7 - 47.9 %   MCV 88.5 80 - 97 fL   MCH, POC 28.8 27 - 31.2 pg   MCHC 32.6 31.8 - 35.4 g/dL   RDW, POC 12.6 %   Platelet Count, POC 385 142 - 424 K/uL   MPV 8.2 0 - 99.8 fL      Assessment & Plan:   Michele Lowery is a 58  y.o. female Dyspnea - Plan: D-dimer, quantitative, POCT CBC, DG Chest 2 View  Cough - Plan: D-dimer, quantitative, POCT CBC, DG Chest 2 View, predniSONE (DELTASONE) 20 MG tablet  Burning chest pain - Plan: D-dimer, quantitative, POCT CBC, DG Chest 2 View  Bronchospasm - Plan: predniSONE (DELTASONE) 20 MG tablet  Persistent cough, with likely initial bronchitis, s/p Zpak with some initial improvement, but persistent cough now and borderline hypoxia. Burning sensation in chest without other chest pain, and no calf/pain/swelling, and no other RF's for PE, but will check D Dimer. Start prednisone for reactive airway and decreased peak flow. Cont albuterol Q4-6h prn.   Ddimer negative - plan as above.    Meds ordered this encounter  Medications  . predniSONE (DELTASONE) 20 MG tablet    Sig: Take 2 tablets (40 mg total) by mouth daily with breakfast.    Dispense:  10 tablet    Refill:  0   Patient Instructions  Your xray appears better.  May be persistent wheezing and inflammation causing cough and lower oxygen.  Start prednisone, can use albuterol every 4-6 hours as needed, and will let you know if other testing needed once blood test returns later today. If not improving in next 4-5 days, recheck. Return to the clinic or go to the nearest emergency room if any of your symptoms worsen or new symptoms occur.  Bronchospasm A bronchospasm is a spasm or tightening of the airways going into the lungs. During a bronchospasm breathing becomes more difficult because the airways get smaller. When this happens there can be coughing, a whistling sound when breathing (wheezing), and difficulty breathing. Bronchospasm is often associated with asthma, but not all patients who experience a bronchospasm have asthma. CAUSES  A bronchospasm is caused by inflammation or irritation of  the airways. The inflammation or irritation may be triggered by:   Allergies (such as to animals, pollen, food, or mold).  Allergens that cause bronchospasm may cause wheezing immediately after exposure or many hours later.   Infection. Viral infections are believed to be the most common cause of bronchospasm.   Exercise.   Irritants (such as pollution, cigarette smoke, strong odors, aerosol sprays, and paint fumes).   Weather changes. Winds increase molds and pollens in the air. Rain refreshes the air by washing irritants out. Cold air may cause inflammation.   Stress and emotional upset.  SIGNS AND SYMPTOMS   Wheezing.   Excessive nighttime coughing.   Frequent or severe coughing with a simple cold.   Chest tightness.   Shortness of breath.  DIAGNOSIS  Bronchospasm is usually diagnosed through a history and physical exam. Tests, such as chest X-rays, are sometimes done to look for other conditions. TREATMENT   Inhaled medicines can be given to open up your airways and help you breathe. The medicines can be given using either an inhaler or a nebulizer machine.  Corticosteroid medicines may be given for severe bronchospasm, usually when it is associated with asthma. HOME CARE INSTRUCTIONS   Always have a plan prepared for seeking medical care. Know when to call your health care provider and local emergency services (911 in the [place] Know where you can access local emergency care.  Only take medicines as directed by your health care provider.  If you were prescribed an inhaler or nebulizer machine, ask your health care provider to explain how to use it correctly. Always use a spacer with your inhaler if you were given one.  It is necessary to remain calm during an attack. Try to relax and breathe more slowly.  Control your home environment in the following ways:   Change your heating and air conditioning filter at least once a month.   Limit your use of fireplaces and wood stoves.  Do not smoke and do not allow smoking in your home.   Avoid exposure to perfumes and  fragrances.   Get rid of pests (such as roaches and mice) and their droppings.   Throw away plants if you see mold on them.   Keep your house clean and dust free.   Replace carpet with wood, tile, or vinyl flooring. Carpet can trap dander and dust.   Use allergy-proof pillows, mattress covers, and box spring covers.   Wash bed sheets and blankets every week in hot water and dry them in a dryer.   Use blankets that are made of polyester or cotton.   Wash hands frequently. SEEK MEDICAL CARE IF:   You have muscle aches.   You have chest pain.   The sputum changes from clear or white to yellow, green, gray, or bloody.   The sputum you cough up gets thicker.   There are problems that may be related to the medicine you are given, such as a rash, itching, swelling, or trouble breathing.  SEEK IMMEDIATE MEDICAL CARE IF:   You have worsening wheezing and coughing even after taking your prescribed medicines.   You have increased difficulty breathing.   You develop severe chest pain. MAKE SURE YOU:   Understand these instructions.  Will watch your condition.  Will get help right away if you are not doing well or get worse. Document Released: 03/30/2003 Document Revised: 04/01/2013 Document Reviewed: 09/16/2012 [facility] Patient Information 2015 [place], [place]. This information is not intended to replace  advice given to you by your health care provider. Make sure you discuss any questions you have with your health care provider.   Cough, Adult  A cough is a reflex that helps clear your throat and airways. It can help heal the body or may be a reaction to an irritated airway. A cough may only last 2 or 3 weeks (acute) or may last more than 8 weeks (chronic).  CAUSES Acute cough:  Viral or bacterial infections. Chronic cough:  Infections.  Allergies.  Asthma.  Post-nasal drip.  Smoking.  Heartburn or acid reflux.  Some medicines.  Chronic lung  problems (COPD).  Cancer. SYMPTOMS   Cough.  Fever.  Chest pain.  Increased breathing rate.  High-pitched whistling sound when breathing (wheezing).  Colored mucus that you cough up (sputum). TREATMENT   A bacterial cough may be treated with antibiotic medicine.  A viral cough must run its course and will not respond to antibiotics.  Your caregiver may recommend other treatments if you have a chronic cough. HOME CARE INSTRUCTIONS   Only take over-the-counter or prescription medicines for pain, discomfort, or fever as directed by your caregiver. Use cough suppressants only as directed by your caregiver.  Use a cold steam vaporizer or humidifier in your bedroom or home to help loosen secretions.  Sleep in a semi-upright position if your cough is worse at night.  Rest as needed.  Stop smoking if you smoke. SEEK IMMEDIATE MEDICAL CARE IF:   You have pus in your sputum.  Your cough starts to worsen.  You cannot control your cough with suppressants and are losing sleep.  You begin coughing up blood.  You have difficulty breathing.  You develop pain which is getting worse or is uncontrolled with medicine.  You have a fever. MAKE SURE YOU:   Understand these instructions.  Will watch your condition.  Will get help right away if you are not doing well or get worse. Document Released: 09/23/2010 Document Revised: 06/19/2011 Document Reviewed: 09/23/2010 [facility] Patient Information 2015 [place], [place]. This information is not intended to replace advice given to you by your health care provider. Make sure you discuss any questions you have with your health care provider.   I personally performed the services described in this documentation, which was scribed in my presence. The recorded information has been reviewed and considered, and addended by me as needed.

## 2014-02-24 NOTE — Telephone Encounter (Signed)
Spoke to pt. She says the cough has greatly improved. She has some tightness from the congestion left in her chest; otherwise she is starting to feel better. She wasn't sure if there was something to give her to help with the chest tightness.

## 2014-02-24 NOTE — Telephone Encounter (Signed)
See patients' email.  Apologize for delay in reaching her, but please call and check status. If she is still not significantly improved - recommend recheck in office in next few days. Let me know how she is doing.

## 2014-02-25 ENCOUNTER — Ambulatory Visit (INDEPENDENT_AMBULATORY_CARE_PROVIDER_SITE_OTHER): Payer: Federal, State, Local not specified - PPO | Admitting: Family Medicine

## 2014-02-25 ENCOUNTER — Telehealth: Payer: Self-pay | Admitting: Family Medicine

## 2014-02-25 VITALS — BP 136/84 | HR 65 | Temp 97.7°F | Resp 18 | Ht 65.0 in | Wt 168.0 lb

## 2014-02-25 DIAGNOSIS — R05 Cough: Secondary | ICD-10-CM

## 2014-02-25 DIAGNOSIS — R059 Cough, unspecified: Secondary | ICD-10-CM

## 2014-02-25 DIAGNOSIS — R0789 Other chest pain: Secondary | ICD-10-CM

## 2014-02-25 NOTE — Telephone Encounter (Signed)
Pt will be in tomorrow evening.

## 2014-02-25 NOTE — Telephone Encounter (Signed)
Lm for pt to rtn call or RTC    Serita Kyle, Rad Tech at 02/24/2014 3:26 PM     Status: Signed       Expand All Collapse All   Spoke to pt. She says the cough has greatly improved. She has some tightness from the congestion left in her chest; otherwise she is starting to feel better. She wasn't sure if there was something to give her to help with the chest tightness.             Wendie Agreste, MD at 02/24/2014 10:54 AM     Status: Signed       Expand All Collapse All   See patients' email. Apologize for delay in reaching her, but please call and check status. If she is still not significantly improved - recommend recheck in office in next few days. Let me know how she is doing.

## 2014-02-25 NOTE — Patient Instructions (Signed)
Your soreness/tightness appears to be form chest wall.  See below, and ok to try heat or ice to affected area, tylenol or advil if needed, but recheck if this is not continuing to improve over the next week to 10 days. Return to the clinic or go to the nearest emergency room if any of your symptoms worsen or new symptoms occur.  Chest Wall Pain Chest wall pain is pain in or around the bones and muscles of your chest. It may take up to 6 weeks to get better. It may take longer if you must stay physically active in your work and activities.  CAUSES  Chest wall pain may happen on its own. However, it may be caused by:  A viral illness like the flu.  Injury.  Coughing.  Exercise.  Arthritis.  Fibromyalgia.  Shingles. HOME CARE INSTRUCTIONS   Avoid overtiring physical activity. Try not to strain or perform activities that cause pain. This includes any activities using your chest or your abdominal and side muscles, especially if heavy weights are used.  Put ice on the sore area.  Put ice in a plastic bag.  Place a towel between your skin and the bag.  Leave the ice on for 15-20 minutes per hour while awake for the first 2 days.  Only take over-the-counter or prescription medicines for pain, discomfort, or fever as directed by your caregiver. SEEK IMMEDIATE MEDICAL CARE IF:   Your pain increases, or you are very uncomfortable.  You have a fever.  Your chest pain becomes worse.  You have new, unexplained symptoms.  You have nausea or vomiting.  You feel sweaty or lightheaded.  You have a cough with phlegm (sputum), or you cough up blood. MAKE SURE YOU:   Understand these instructions.  Will watch your condition.  Will get help right away if you are not doing well or get worse. Document Released: 03/27/2005 Document Revised: 06/19/2011 Document Reviewed: 11/21/2010 [facility] Patient Information 2015 [place], [place]. This information is not intended to replace advice  given to you by your health care provider. Make sure you discuss any questions you have with your health care provider.   Cough, Adult  A cough is a reflex that helps clear your throat and airways. It can help heal the body or may be a reaction to an irritated airway. A cough may only last 2 or 3 weeks (acute) or may last more than 8 weeks (chronic).  CAUSES Acute cough:  Viral or bacterial infections. Chronic cough:  Infections.  Allergies.  Asthma.  Post-nasal drip.  Smoking.  Heartburn or acid reflux.  Some medicines.  Chronic lung problems (COPD).  Cancer. SYMPTOMS   Cough.  Fever.  Chest pain.  Increased breathing rate.  High-pitched whistling sound when breathing (wheezing).  Colored mucus that you cough up (sputum). TREATMENT   A bacterial cough may be treated with antibiotic medicine.  A viral cough must run its course and will not respond to antibiotics.  Your caregiver may recommend other treatments if you have a chronic cough. HOME CARE INSTRUCTIONS   Only take over-the-counter or prescription medicines for pain, discomfort, or fever as directed by your caregiver. Use cough suppressants only as directed by your caregiver.  Use a cold steam vaporizer or humidifier in your bedroom or home to help loosen secretions.  Sleep in a semi-upright position if your cough is worse at night.  Rest as needed.  Stop smoking if you smoke. SEEK IMMEDIATE MEDICAL CARE IF:  You have pus in your sputum.  Your cough starts to worsen.  You cannot control your cough with suppressants and are losing sleep.  You begin coughing up blood.  You have difficulty breathing.  You develop pain which is getting worse or is uncontrolled with medicine.  You have a fever. MAKE SURE YOU:   Understand these instructions.  Will watch your condition.  Will get help right away if you are not doing well or get worse. Document Released: 09/23/2010 Document Revised:  06/19/2011 Document Reviewed: 09/23/2010 [facility] Patient Information 2015 [place], [place]. This information is not intended to replace advice given to you by your health care provider. Make sure you discuss any questions you have with your health care provider.

## 2014-02-25 NOTE — Progress Notes (Addendum)
Subjective:   Patient ID: Michele Lowery, female    DOB: 09-21-1955, 58 y.o.   MRN: [id number]  This chart was scribed for [name], MD by Lowella Petties, ED Scribe. The patient was seen in room 5. Patient's care was started at 9:41 PM.  HPI  HPI Comments: Michele Lowery is a 58 y.o. female who presents to the [facility] for a f/u. When she was last seen by me 11 days ago she had been seen by me two weeks prior with bronchitis and brohchospasm vs. early pneumonia. See prior notes. X-ray report showed mild bronchitis changes in left basilar atelectasis. She had a borderline O2 sat of 92% in the office. D-dimer was negative. Started on Prednisone 40 mg QD for five days. Called patient to check status today. Cough improved, but still some tightness in chest. Asked to return to clinic for evaluation.   She states that her O2 has been 94-95% at home. She states that her cough has improved, but that she has occasional tightness in the center of her chest. She reports a "burning" sensation in her central chest. She states that her chest tightness is worst at night, and it is not exacerbated by exercion. She additionally reports a sore spot on her back that radiates to her chest causing her pain that she can reproduce with palpation. She reports that she has taken Tylenol with relief. She denies chest pains. She denies SOB, nausea, or dizziness. She denies heart problems in the past. She reports having an echo in about 2011. She states that overall she feels better. She denies using any medication for cough in the last 3-4 days. She denies congestion or drainage. She reports taking Prilosec regularly at night.   Review of Systems  Constitutional: Negative for fever and chills.  HENT: Negative for congestion.   Respiratory: Positive for cough and chest tightness.    Objective:  Physical Exam  Constitutional: She is oriented to person, place, and time. She appears  well-developed and well-nourished. No distress.  HENT:  Head: Normocephalic and atraumatic.  Right Ear: Hearing, tympanic membrane, external ear and ear canal normal.  Left Ear: Hearing, tympanic membrane, external ear and ear canal normal.  Nose: Nose normal.  Mouth/Throat: Oropharynx is clear and moist. No oropharyngeal exudate.  Eyes: Conjunctivae and EOM are normal. Pupils are equal, round, and reactive to light.  Cardiovascular: Normal rate, regular rhythm, normal heart sounds and intact distal pulses.   No murmur heard. Pulmonary/Chest: Effort normal and breath sounds normal. No respiratory distress. She has no wheezes. She has no rhonchi.  Able to reproduce tightness/soreness over right upper chest wall and right upper posterior chest and rhomboid muscle with palpation.   Musculoskeletal: She exhibits no edema.  Neurological: She is alert and oriented to person, place, and time.  Skin: Skin is warm and dry. No rash noted.  Psychiatric: She has a normal mood and affect. Her behavior is normal.  Vitals reviewed.    Filed Vitals:   02/25/14 2031  BP: 136/84  Pulse: 65  Temp: 97.7 F (36.5 C)  TempSrc: Oral  Resp: 18  Height: 5\' 5"  (1.651 m)  Weight: 168 lb (76.204 kg)  SpO2: 95%    Assessment & Plan:  [name] is a 58 y.o. female Cough  Chest wall pain  Overall improved. Not dyspneic. Suspect chest wall pain from coughing, and underlying heartburn may be flared at times.  Prior D Dimer  negative.   -sx care with tylenol or ibuprofen.   -trigger avoidance for GERD (including peppermint which she has taken) and continue PPI.   -if not continuing to improve over next 10 days, may need further imaging or eval - RTC, or sooner if worse.    No orders of the defined types were placed in this encounter.   Patient Instructions  Your soreness/tightness appears to be form chest wall.  See below, and ok to try heat or ice to affected area, tylenol or advil if needed, but  recheck if this is not continuing to improve over the next week to 10 days. Return to the clinic or go to the nearest emergency room if any of your symptoms worsen or new symptoms occur.  Chest Wall Pain Chest wall pain is pain in or around the bones and muscles of your chest. It may take up to 6 weeks to get better. It may take longer if you must stay physically active in your work and activities.  CAUSES  Chest wall pain may happen on its own. However, it may be caused by:  A viral illness like the flu.  Injury.  Coughing.  Exercise.  Arthritis.  Fibromyalgia.  Shingles. HOME CARE INSTRUCTIONS   Avoid overtiring physical activity. Try not to strain or perform activities that cause pain. This includes any activities using your chest or your abdominal and side muscles, especially if heavy weights are used.  Put ice on the sore area.  Put ice in a plastic bag.  Place a towel between your skin and the bag.  Leave the ice on for 15-20 minutes per hour while awake for the first 2 days.  Only take over-the-counter or prescription medicines for pain, discomfort, or fever as directed by your caregiver. SEEK IMMEDIATE MEDICAL CARE IF:   Your pain increases, or you are very uncomfortable.  You have a fever.  Your chest pain becomes worse.  You have new, unexplained symptoms.  You have nausea or vomiting.  You feel sweaty or lightheaded.  You have a cough with phlegm (sputum), or you cough up blood. MAKE SURE YOU:   Understand these instructions.  Will watch your condition.  Will get help right away if you are not doing well or get worse. Document Released: 03/27/2005 Document Revised: 06/19/2011 Document Reviewed: 11/21/2010 [facility] Patient Information 2015 [place], [place]. This information is not intended to replace advice given to you by your health care provider. Make sure you discuss any questions you have with your health care provider.   Cough, Adult  A cough  is a reflex that helps clear your throat and airways. It can help heal the body or may be a reaction to an irritated airway. A cough may only last 2 or 3 weeks (acute) or may last more than 8 weeks (chronic).  CAUSES Acute cough:  Viral or bacterial infections. Chronic cough:  Infections.  Allergies.  Asthma.  Post-nasal drip.  Smoking.  Heartburn or acid reflux.  Some medicines.  Chronic lung problems (COPD).  Cancer. SYMPTOMS   Cough.  Fever.  Chest pain.  Increased breathing rate.  High-pitched whistling sound when breathing (wheezing).  Colored mucus that you cough up (sputum). TREATMENT   A bacterial cough may be treated with antibiotic medicine.  A viral cough must run its course and will not respond to antibiotics.  Your caregiver may recommend other treatments if you have a chronic cough. HOME CARE INSTRUCTIONS   Only take over-the-counter or prescription  medicines for pain, discomfort, or fever as directed by your caregiver. Use cough suppressants only as directed by your caregiver.  Use a cold steam vaporizer or humidifier in your bedroom or home to help loosen secretions.  Sleep in a semi-upright position if your cough is worse at night.  Rest as needed.  Stop smoking if you smoke. SEEK IMMEDIATE MEDICAL CARE IF:   You have pus in your sputum.  Your cough starts to worsen.  You cannot control your cough with suppressants and are losing sleep.  You begin coughing up blood.  You have difficulty breathing.  You develop pain which is getting worse or is uncontrolled with medicine.  You have a fever. MAKE SURE YOU:   Understand these instructions.  Will watch your condition.  Will get help right away if you are not doing well or get worse. Document Released: 09/23/2010 Document Revised: 06/19/2011 Document Reviewed: 09/23/2010 [facility] Patient Information 2015 [place], [place]. This information is not intended to replace advice given  to you by your health care provider. Make sure you discuss any questions you have with your health care provider.    I personally performed the services described in this documentation, which was scribed in my presence. The recorded information has been reviewed and considered, and addended by me as needed.

## 2014-02-25 NOTE — Telephone Encounter (Signed)
See prior messages - if still having chest tightness - would recommend recheck.

## 2014-03-14 ENCOUNTER — Other Ambulatory Visit: Payer: Self-pay | Admitting: Family Medicine

## 2014-04-14 ENCOUNTER — Other Ambulatory Visit: Payer: Self-pay | Admitting: Physician Assistant

## 2014-05-12 ENCOUNTER — Other Ambulatory Visit: Payer: Self-pay

## 2014-05-12 DIAGNOSIS — Z1231 Encounter for screening mammogram for malignant neoplasm of breast: Secondary | ICD-10-CM

## 2014-05-18 ENCOUNTER — Other Ambulatory Visit: Payer: Self-pay

## 2014-05-18 NOTE — Telephone Encounter (Signed)
[facility] reqs 90 day RF of pravastatin. Pt has appt on 06/24/14. Do you want to OK 90 day?

## 2014-05-20 ENCOUNTER — Ambulatory Visit
Admission: RE | Admit: 2014-05-20 | Discharge: 2014-05-20 | Disposition: A | Discharge status: Home / Self Care | Payer: Federal, State, Local not specified - PPO | Source: Ambulatory Visit

## 2014-05-20 DIAGNOSIS — Z1231 Encounter for screening mammogram for malignant neoplasm of breast: Secondary | ICD-10-CM

## 2014-05-20 MED ORDER — PRAVASTATIN SODIUM 40 MG PO TABS: 40.0000 mg | ORAL_TABLET | Freq: Every day | ORAL | Status: DC

## 2014-05-20 NOTE — Telephone Encounter (Signed)
Pravastatin 90-day supply refilled.

## 2014-06-24 ENCOUNTER — Ambulatory Visit (INDEPENDENT_AMBULATORY_CARE_PROVIDER_SITE_OTHER): Payer: Federal, State, Local not specified - PPO | Admitting: Family Medicine

## 2014-06-24 ENCOUNTER — Encounter: Payer: Self-pay | Admitting: Family Medicine

## 2014-06-24 VITALS — BP 132/79 | HR 79 | Temp 98.1°F | Resp 16 | Ht 66.5 in | Wt 168.0 lb

## 2014-06-24 DIAGNOSIS — G629 Polyneuropathy, unspecified: Secondary | ICD-10-CM | POA: Diagnosis not present

## 2014-06-24 DIAGNOSIS — E785 Hyperlipidemia, unspecified: Secondary | ICD-10-CM | POA: Diagnosis not present

## 2014-06-24 DIAGNOSIS — I10 Essential (primary) hypertension: Secondary | ICD-10-CM

## 2014-06-24 DIAGNOSIS — F329 Major depressive disorder, single episode, unspecified: Secondary | ICD-10-CM

## 2014-06-24 DIAGNOSIS — Z23 Encounter for immunization: Secondary | ICD-10-CM | POA: Diagnosis not present

## 2014-06-24 DIAGNOSIS — F32A Depression, unspecified: Secondary | ICD-10-CM

## 2014-06-24 LAB — BASIC METABOLIC PANEL
BUN: 15 mg/dL (ref 6–23)
CHLORIDE: 102 meq/L (ref 96–112)
CO2: 28 meq/L (ref 19–32)
Calcium: 9.6 mg/dL (ref 8.4–10.5)
Creat: 0.65 mg/dL (ref 0.50–1.10)
Glucose, Bld: 84 mg/dL (ref 70–99)
Potassium: 4.5 mEq/L (ref 3.5–5.3)
Sodium: 142 mEq/L (ref 135–145)

## 2014-06-24 LAB — LIPID PANEL
CHOL/HDL RATIO: 3.3 ratio
CHOLESTEROL: 211 mg/dL — AB (ref 0–200)
HDL: 63 mg/dL (ref >=46)
LDL Cholesterol: 114 mg/dL — ABNORMAL HIGH (ref 0–99)
Triglycerides: 171 mg/dL — ABNORMAL HIGH (ref <150)
VLDL: 34 mg/dL (ref 0–40)

## 2014-06-24 LAB — ALT: ALT: 20 U/L (ref 0–35)

## 2014-06-24 MED ORDER — FLUOXETINE HCL 20 MG PO TABS: 20.0000 mg | ORAL_TABLET | Freq: Every day | ORAL | Status: DC

## 2014-06-24 NOTE — Patient Instructions (Signed)

## 2014-06-25 ENCOUNTER — Encounter: Payer: Self-pay | Admitting: Family Medicine

## 2014-06-25 LAB — VITAMIN D 25 HYDROXY (VIT D DEFICIENCY, FRACTURES): VIT D 25 HYDROXY: 34 ng/mL (ref 30–100)

## 2014-06-25 NOTE — Progress Notes (Signed)
Subjective:    Patient ID: Michele Lowery, female    DOB: [birth year], 59 y.o.   MRN: [id number]  HPI  This 59 y.o. Female is here for Hyperlipidemia follow-up; she is compliant with pravastatin w/o adverse side effects. Elevated lipids date back > 10 years. Pt has been taking current statin since Dec 2011. She has been fairly compliant w/ nutrition changes, though she has been traveling a lot in the last 4-6 months and eating out more. Her husband recently had a heart attack; she wants to try the medication he has been prescribed.  HTN is stable on current medication; no adverse effects reported. She had an extensive cardiac work-up in Sept 2011 (negative Cardiolite stress test); 2-D ECHO w/ LVEF 55%.  Depression- pt has been on fluoxetine 40 mg daily for many years and wants to decrease dose to 20 mg daily. She has concerns about long-term cardiac problems w/ prolonged high dose. She feels that her emotional status is better and is stable; she actually reduced the medication dose 1 month ago.  Patient Active Problem List   Diagnosis Date Noted  . Peripheral neuropathy 12/17/2013  . Rosacea   . HLD (hyperlipidemia)   . HTN (hypertension)   . Depression   . ? Hernia of abdominal wall - epigastrium 10/17/2010    Class: Question of  . Heartburn 10/17/2010  . Overweight (BMI 25.0-29.9) 10/17/2010   Prior to Admission medications   Medication Sig Start Date End Date Taking? Authorizing Provider  aspirin 81 MG tablet Take 81 mg by mouth daily.     Yes Historical Provider, MD  b complex vitamins tablet Take 1 tablet by mouth daily.   Yes Historical Provider, MD  cholecalciferol (VITAMIN D) 400 UNITS TABS tablet Take 400 Units by mouth.   Yes Historical Provider, MD  losartan-hydrochlorothiazide (HYZAAR) 100-12.5 MG per tablet TAKE 1 TABLET BY MOUTH DAILY. 12/17/13  Yes Barton Fanny, MD  Omega-3 Fatty Acids (FISH OIL PO) Take 4 tablets by mouth daily.    Yes Historical Provider, MD    omeprazole (PRILOSEC) 20 MG capsule TAKE 1 CAPSULE (20 MG TOTAL) BY MOUTH DAILY.   Yes Collene Leyden, PA-C  pravastatin (PRAVACHOL) 40 MG tablet Take 1 tablet (40 mg total) by mouth daily. 05/20/14  Yes Barton Fanny, MD  sucralfate (CARAFATE) 1 G tablet TAKE 1 TABLET BY MOUTH ONCE DAILY 10/23/13  Yes Chelle S Jeffery, PA-C  FLUoxetine (PROZAC) 40 MG tablet Take 1 tablet (40 mg total) by mouth daily.    Barton Fanny, MD    History   Social History  . Marital Status: Married    Spouse Name: N/A  . Number of Children: 2  . Years of Education: N/A   Occupational History  . Control and instrumentation engineer    Social History Main Topics  . Smoking status: Former Smoker -- 0.50 packs/day for 4 years    Types: Cigarettes    Quit date: 07/09/1976  . Smokeless tobacco: Never Used  . Alcohol Use: 0.5 oz/week    1 drink(s) per week     Comment: 1-2 per month   . Drug Use: No  . Sexual Activity: No   Other Topics Concern  . Not on file   Social History Narrative   1 caffeine drink daily     Family History  Problem Relation Age of Onset  . COPD Mother   . Heart disease Father     Review of Systems  Constitutional: Negative.   HENT: Negative for nosebleeds and trouble swallowing.   Eyes: Negative.   Respiratory: Negative for cough, chest tightness and shortness of breath.   Cardiovascular: Negative.   Endocrine: Negative.   Musculoskeletal: Negative for myalgias and arthralgias.  Skin: Negative.   Neurological: Negative for dizziness, syncope, weakness, light-headedness, numbness and headaches.  Psychiatric/Behavioral: Negative.        Objective:   Physical Exam  Constitutional: She is oriented to person, place, and time. Vital signs are normal. She appears well-developed and well-nourished. No distress.  Blood pressure 132/79, pulse 79, temperature 98.1 F (36.7 C), resp. rate 16, height 5' 6.5" (1.689 m), weight 168 lb (76.204 kg), SpO2 95 %.    HENT:  Head:  Normocephalic and atraumatic.  Right Ear: External ear normal.  Left Ear: External ear normal.  Nose: Nose normal.  Mouth/Throat: Oropharynx is clear and moist.  Eyes: Conjunctivae and EOM are normal. No scleral icterus.  Neck: Normal range of motion. Neck supple.  Cardiovascular: Normal rate, regular rhythm and normal heart sounds.   Pulmonary/Chest: Effort normal and breath sounds normal. No respiratory distress.  Musculoskeletal: Normal range of motion. She exhibits no edema or tenderness.  Neurological: She is alert and oriented to person, place, and time. No cranial nerve deficit. Coordination and gait normal.  Skin: Skin is warm and dry.  Psychiatric: She has a normal mood and affect. Her behavior is normal. Judgment and thought content normal.  Nursing note and vitals reviewed.      Assessment & Plan:  HLD (hyperlipidemia) - Pt will contact me with name of medication prescribed for her husband after his MI. Plan: Lipid panel, ALT  Essential hypertension - Stable on medication; continue losartan- HCTZ 100-12.5 mg 1 tab/day  Plan: Vitamin D, 09-[id number], Basic metabolic panel  Depression - Stable on fluoxetine 20 mg 1 capsule daily. Plan: Vitamin D, 25-hydroxy  Peripheral neuropathy - Chronic and not felt to be related to statin use. Plan: Vitamin D, 25-hydroxy  Need for immunization against influenza - Plan: CANCELED: Flu Vaccine QUAD 36+ mos IM (Fluarix)   Meds ordered this encounter  Medications  . FLUoxetine (PROZAC) 20 MG tablet    Sig: Take 1 tablet (20 mg total) by mouth daily.    Dispense:  90 tablet    Refill:  3

## 2014-07-12 ENCOUNTER — Encounter: Payer: Self-pay | Admitting: Family Medicine

## 2014-07-14 ENCOUNTER — Other Ambulatory Visit: Payer: Self-pay | Admitting: Family Medicine

## 2014-07-14 MED ORDER — ATORVASTATIN CALCIUM 40 MG PO TABS: 40.0000 mg | ORAL_TABLET | Freq: Every day | ORAL | Status: DC

## 2014-11-11 ENCOUNTER — Other Ambulatory Visit: Payer: Self-pay | Admitting: Family Medicine

## 2014-12-09 ENCOUNTER — Other Ambulatory Visit: Payer: Self-pay | Admitting: *Deleted

## 2014-12-09 DIAGNOSIS — R12 Heartburn: Secondary | ICD-10-CM

## 2014-12-09 MED ORDER — OMEPRAZOLE 20 MG PO CPDR: DELAYED_RELEASE_CAPSULE | ORAL | Status: DC

## 2014-12-21 ENCOUNTER — Ambulatory Visit (INDEPENDENT_AMBULATORY_CARE_PROVIDER_SITE_OTHER): Payer: Federal, State, Local not specified - PPO | Admitting: Family Medicine

## 2014-12-21 ENCOUNTER — Encounter: Payer: Self-pay | Admitting: Family Medicine

## 2014-12-21 VITALS — BP 130/77 | HR 76 | Temp 98.3°F | Resp 16 | Ht 67.0 in | Wt 166.6 lb

## 2014-12-21 DIAGNOSIS — H6502 Acute serous otitis media, left ear: Secondary | ICD-10-CM | POA: Diagnosis not present

## 2014-12-21 DIAGNOSIS — H9202 Otalgia, left ear: Secondary | ICD-10-CM | POA: Diagnosis not present

## 2014-12-21 DIAGNOSIS — Z0000 Encounter for general adult medical examination without abnormal findings: Secondary | ICD-10-CM

## 2014-12-21 DIAGNOSIS — F329 Major depressive disorder, single episode, unspecified: Secondary | ICD-10-CM

## 2014-12-21 DIAGNOSIS — Z23 Encounter for immunization: Secondary | ICD-10-CM

## 2014-12-21 DIAGNOSIS — F32A Depression, unspecified: Secondary | ICD-10-CM

## 2014-12-21 DIAGNOSIS — Z124 Encounter for screening for malignant neoplasm of cervix: Secondary | ICD-10-CM | POA: Diagnosis not present

## 2014-12-21 DIAGNOSIS — D225 Melanocytic nevi of trunk: Secondary | ICD-10-CM | POA: Diagnosis not present

## 2014-12-21 DIAGNOSIS — N393 Stress incontinence (female) (male): Secondary | ICD-10-CM

## 2014-12-21 DIAGNOSIS — G629 Polyneuropathy, unspecified: Secondary | ICD-10-CM | POA: Diagnosis not present

## 2014-12-21 DIAGNOSIS — R12 Heartburn: Secondary | ICD-10-CM

## 2014-12-21 DIAGNOSIS — E785 Hyperlipidemia, unspecified: Secondary | ICD-10-CM

## 2014-12-21 DIAGNOSIS — I10 Essential (primary) hypertension: Secondary | ICD-10-CM | POA: Diagnosis not present

## 2014-12-21 LAB — COMPLETE METABOLIC PANEL WITH GFR
ALT: 21 U/L (ref 6–29)
AST: 20 U/L (ref 10–35)
Albumin: 4.3 g/dL (ref 3.6–5.1)
Alkaline Phosphatase: 79 U/L (ref 33–130)
BILIRUBIN TOTAL: 0.8 mg/dL (ref 0.2–1.2)
BUN: 25 mg/dL (ref 7–25)
CALCIUM: 9.4 mg/dL (ref 8.6–10.4)
CHLORIDE: 101 mmol/L (ref 98–110)
CO2: 30 mmol/L (ref 20–31)
CREATININE: 1.02 mg/dL — AB (ref 0.50–0.99)
GFR, EST AFRICAN AMERICAN: 69 mL/min (ref >=60)
GFR, EST NON AFRICAN AMERICAN: 60 mL/min (ref >=60)
Glucose, Bld: 91 mg/dL (ref 65–99)
Potassium: 4.2 mmol/L (ref 3.5–5.3)
Sodium: 143 mmol/L (ref 135–146)
Total Protein: 6.6 g/dL (ref 6.1–8.1)

## 2014-12-21 LAB — TSH: TSH: 2.345 u[IU]/mL (ref 0.350–4.500)

## 2014-12-21 LAB — VITAMIN B12: VITAMIN B 12: 451 pg/mL (ref 211–911)

## 2014-12-21 MED ORDER — FLUOXETINE HCL 20 MG PO TABS: 20.0000 mg | ORAL_TABLET | Freq: Every day | ORAL | Status: DC

## 2014-12-21 MED ORDER — ATORVASTATIN CALCIUM 40 MG PO TABS: 40.0000 mg | ORAL_TABLET | Freq: Every day | ORAL | Status: DC

## 2014-12-21 MED ORDER — LOSARTAN POTASSIUM-HCTZ 100-12.5 MG PO TABS: ORAL_TABLET | ORAL | Status: DC

## 2014-12-21 MED ORDER — OMEPRAZOLE 20 MG PO CPDR: DELAYED_RELEASE_CAPSULE | ORAL | Status: DC

## 2014-12-21 NOTE — Patient Instructions (Addendum)
Continue exercise, but if increasing pain in the abdominal wall in area as in past - let me know and I can refer you back to general surgery.  You should receive a call or letter about your lab results within the next week to 10 days.  Can try zantac or pepcid if mild heartburn.  prilosec if this is needed and only as needed. See foods below to avoid.  Follow up to discuss burning in feet further, especially if noted more than at night.  See information on incontinence.  cointinue kegel exercises daily as discussed, i will refer you to urology. Call your dermatologist about area on back for follow up appointment, and to check other nevi (moles) Try flonase nasal spray for ear pain.  If not improving in few days, call for possible antibiotic, or return to clinic if fevers, discharge or worsening.  Follow up in 6 months for cholesterol, blood pressure assessment.    Keeping You Healthy  Get These Tests  Blood Pressure- Have your blood pressure checked by your healthcare provider at least once a year.  Normal blood pressure is 120/80.  Weight- Have your body mass index (BMI) calculated to screen for obesity.  BMI is a measure of body fat based on height and weight.  You can calculate your own BMI at [employer]  Cholesterol- Have your cholesterol checked every year.  Diabetes- Have your blood sugar checked every year if you have high blood pressure, high cholesterol, a family history of diabetes or if you are overweight.  Pap Test - Have a pap test every 1 to 5 years if you have been sexually active.  If you are older than 65 and recent pap tests have been normal you may not need additional pap tests.  In addition, if you have had a hysterectomy  for benign disease additional pap tests are not necessary.  Mammogram-Yearly mammograms are essential for early detection of breast cancer  Screening for Colon Cancer- Colonoscopy starting at age 50. Screening may begin sooner depending  on your family history and other health conditions.  Follow up colonoscopy as directed by your Gastroenterologist.  Screening for Osteoporosis- Screening begins at age 24 with bone density scanning, sooner if you are at higher risk for developing Osteoporosis.  Get these medicines  Calcium with Vitamin D- Your body requires 1200-1500 mg of Calcium a day and [phone] IU of Vitamin D a day.  You can only absorb 500 mg of Calcium at a time therefore Calcium must be taken in 2 or 3 separate doses throughout the day.  Hormones- Hormone therapy has been associated with increased risk for certain cancers and heart disease.  Talk to your healthcare provider about if you need relief from menopausal symptoms.  Aspirin- Ask your healthcare provider about taking Aspirin to prevent Heart Disease and Stroke.  Get these Immuniztions  Flu shot- Every fall  Pneumonia shot- Once after the age of 49; if you are younger ask your healthcare provider if you need a pneumonia shot.  Tetanus- Every ten years.  Zostavax- Once after the age of 61 to prevent shingles.  Take these steps  Don't smoke- Your healthcare provider can help you quit. For tips on how to quit, ask your healthcare provider or go to www.smokefree.gov or call 1-800 QUIT-NOW.  Be physically active- Exercise 5 days a week for a minimum of 30 minutes.  If you are not already physically active, start slow and gradually work up to 30 minutes of moderate  physical activity.  Try walking, dancing, bike riding, swimming, etc.  Eat a healthy diet- Eat a variety of healthy foods such as fruits, vegetables, whole grains, low fat milk, low fat cheeses, yogurt, lean meats, chicken, fish, eggs, dried beans, tofu, etc.  For more information go to www.thenutritionsource.org  Dental visit- Brush and floss teeth twice daily; visit your dentist twice a year.  Eye exam- Visit your Optometrist or Ophthalmologist yearly.  Drink alcohol in moderation- Limit  alcohol intake to one drink or less a day.  Never drink and drive.  Depression- Your emotional health is as important as your physical health.  If you're feeling down or losing interest in things you normally enjoy, please talk to your healthcare provider.  Seat Belts- can save your life; always wear one  Smoke/Carbon Monoxide detectors- These detectors need to be installed on the appropriate level of your home.  Replace batteries at least once a year.  Violence- If anyone is threatening or hurting you, please tell your healthcare provider.  Living Will/ Health care power of attorney- Discuss with your healthcare provider and family.   Urinary Incontinence Urinary incontinence is the involuntary loss of urine from your bladder. CAUSES  There are many causes of urinary incontinence. They include:  Medicines.  Infections.  Prostatic enlargement, leading to overflow of urine from your bladder.  Surgery.  Neurological diseases.  Emotional factors. SIGNS AND SYMPTOMS Urinary Incontinence can be divided into four types:  Urge incontinence. Urge incontinence is the involuntary loss of urine before you have the opportunity to go to the bathroom. There is a sudden urge to void but not enough time to reach a bathroom.  Stress incontinence. Stress incontinence is the sudden loss of urine with any activity that forces urine to pass. It is commonly caused by anatomical changes to the pelvis and sphincter areas of your body.  Overflow incontinence. Overflow incontinence is the loss of urine from an obstructed opening to your bladder. This results in a backup of urine and a resultant buildup of pressure within the bladder. When the pressure within the bladder exceeds the closing pressure of the sphincter, the urine overflows, which causes incontinence, similar to water overflowing a dam.  Total incontinence. Total incontinence is the loss of urine as a result of the inability to store urine  within your bladder. DIAGNOSIS  Evaluating the cause of incontinence may require:  A thorough and complete medical and obstetric history.  A complete physical exam.  Laboratory tests such as a urine culture and sensitivities. When additional tests are indicated, they can include:  An ultrasound exam.  Kidney and bladder X-rays.  Cystoscopy. This is an exam of the bladder using a narrow scope.  Urodynamic testing to test the nerve function to the bladder and sphincter areas. TREATMENT  Treatment for urinary incontinence depends on the cause:  For urge incontinence caused by a bacterial infection, antibiotics will be prescribed. If the urge incontinence is related to medicines you take, your health care provider may have you change the medicine.  For stress incontinence, surgery to re-establish anatomical support to the bladder or sphincter, or both, will often correct the condition.  For overflow incontinence caused by an enlarged prostate, an operation to open the channel through the enlarged prostate will allow the flow of urine out of the bladder. In women with fibroids, a hysterectomy may be recommended.  For total incontinence, surgery on your urinary sphincter may help. An artificial urinary sphincter (an inflatable cuff placed  around the urethra) may be required. In women who have developed a hole-like passage between their bladder and vagina (vesicovaginal fistula), surgery to close the fistula often is required. HOME CARE INSTRUCTIONS  Normal daily hygiene and the use of pads or adult diapers that are changed regularly will help prevent odors and skin damage.  Avoid caffeine. It can overstimulate your bladder.  Use the bathroom regularly. Try about every 2-3 hours to go to the bathroom, even if you do not feel the need to do so. Take time to empty your bladder completely. After urinating, wait a minute. Then try to urinate again.  For causes involving nerve dysfunction,  keep a log of the medicines you take and a journal of the times you go to the bathroom. SEEK MEDICAL CARE IF:  You experience worsening of pain instead of improvement in pain after your procedure.  Your incontinence becomes worse instead of better. SEE IMMEDIATE MEDICAL CARE IF:  You experience fever or shaking chills.  You are unable to pass your urine.  You have redness spreading into your groin or down into your thighs. MAKE SURE YOU:   Understand these instructions.   Will watch your condition.  Will get help right away if you are not doing well or get worse. Document Released: 05/04/2004 Document Revised: 01/15/2013 Document Reviewed: 09/03/2012 [facility] Patient Information 2015 [place], [place]. This information is not intended to replace advice given to you by your health care provider. Make sure you discuss any questions you have with your health care provider.   Food Choices for Gastroesophageal Reflux Disease When you have gastroesophageal reflux disease (GERD), the foods you eat and your eating habits are very important. Choosing the right foods can help ease the discomfort of GERD. WHAT GENERAL GUIDELINES DO I NEED TO FOLLOW?  Choose fruits, vegetables, whole grains, low-fat dairy products, and low-fat meat, fish, and poultry.  Limit fats such as oils, salad dressings, butter, nuts, and avocado.  Keep a food diary to identify foods that cause symptoms.  Avoid foods that cause reflux. These may be different for different people.  Eat frequent small meals instead of three large meals each day.  Eat your meals slowly, in a relaxed setting.  Limit fried foods.  Cook foods using methods other than frying.  Avoid drinking alcohol.  Avoid drinking large amounts of liquids with your meals.  Avoid bending over or lying down until 2-3 hours after eating. WHAT FOODS ARE NOT RECOMMENDED? The following are some foods and drinks that may worsen your  symptoms: Vegetables Tomatoes. Tomato juice. Tomato and spaghetti sauce. Chili peppers. Onion and garlic. Horseradish. Fruits Oranges, grapefruit, and lemon (fruit and juice). Meats High-fat meats, fish, and poultry. This includes hot dogs, ribs, ham, sausage, salami, and bacon. Dairy Whole milk and chocolate milk. Sour cream. Cream. Butter. Ice cream. Cream cheese.  Beverages Coffee and tea, with or without caffeine. Carbonated beverages or energy drinks. Condiments Hot sauce. Barbecue sauce.  Sweets/Desserts Chocolate and cocoa. Donuts. Peppermint and spearmint. Fats and Oils High-fat foods, including [place] fries and potato chips. Other Vinegar. Strong spices, such as black pepper, white pepper, red pepper, cayenne, curry powder, cloves, ginger, and chili powder. The items listed above may not be a complete list of foods and beverages to avoid. Contact your dietitian for more information. Document Released: 03/27/2005 Document Revised: 04/01/2013 Document Reviewed: 01/29/2013 [facility] Patient Information 2015 [place], [place]. This information is not intended to replace advice given to you by your health care  provider. Make sure you discuss any questions you have with your health care provider.  Serous Otitis Media Serous otitis media is fluid in the middle ear space. This space contains the bones for hearing and air. Air in the middle ear space helps to transmit sound.  The air gets there through the eustachian tube. This tube goes from the back of the nose (nasopharynx) to the middle ear space. It keeps the pressure in the middle ear the same as the outside world. It also helps to drain fluid from the middle ear space. CAUSES  Serous otitis media occurs when the eustachian tube gets blocked. Blockage can come from:  Ear infections.  Colds and other upper respiratory infections.  Allergies.  Irritants such as cigarette smoke.  Sudden changes in air pressure (such as descending  in an airplane).  Enlarged adenoids.  A mass in the nasopharynx. During colds and upper respiratory infections, the middle ear space can become temporarily filled with fluid. This can happen after an ear infection also. Once the infection clears, the fluid will generally drain out of the ear through the eustachian tube. If it does not, then serous otitis media occurs. SIGNS AND SYMPTOMS   Hearing loss.  A feeling of fullness in the ear, without pain.  Young children may not show any symptoms but may show slight behavioral changes, such as agitation, ear pulling, or crying. DIAGNOSIS  Serous otitis media is diagnosed by an ear exam. Tests may be done to check on the movement of the eardrum. Hearing exams may also be done. TREATMENT  The fluid most often goes away without treatment. If allergy is the cause, allergy treatment may be helpful. Fluid that persists for several months may require minor surgery. A small tube is placed in the eardrum to:  Drain the fluid.  Restore the air in the middle ear space. In certain situations, antibiotic medicines are used to avoid surgery. Surgery may be done to remove enlarged adenoids (if this is the cause). HOME CARE INSTRUCTIONS   Keep children away from tobacco smoke.  Keep all follow-up visits as directed by your health care provider. SEEK MEDICAL CARE IF:   Your hearing is not better in 3 months.  Your hearing is worse.  You have ear pain.  You have drainage from the ear.  You have dizziness.  You have serous otitis media only in one ear or have any bleeding from your nose (epistaxis).  You notice a lump on your neck. MAKE SURE YOU:  Understand these instructions.   Will watch your condition.   Will get help right away if you are not doing well or get worse.  Document Released: 06/17/2003 Document Revised: 08/11/2013 Document Reviewed: 10/22/2012 [facility] Patient Information 2015 [place], [place]. This information is not  intended to replace advice given to you by your health care provider. Make sure you discuss any questions you have with your health care provider.

## 2014-12-21 NOTE — Progress Notes (Signed)
Subjective:    Patient ID: Michele Lowery, female    DOB: Sep 29, 1955, 59 y.o.   MRN: [id number] This chart was scribed for Michele Ray, MD by Zola Button, Medical Scribe. This patient was seen in Room 25 and the patient's care was started at 2:57 PM.    HPI HPI Comments: Michele Lowery is a 59 y.o. female with a history of hypertension who presents to the [facility] for a complete physical exam. Previous PCP was Dr. Leward Quan, but I have also followed her for acute illness in the past. Last visit with Dr. Leward Quan was in April of this year. She is here for a physical today.  Cancer screening:  Colon cancer screening - colonoscopy August 2010 by Dr. Carlean Purl, normal, repeat in 10 years. Breast cancer screening - February 2016 mammogram normal, repeat in 1 year. Cervical cancer/pap testing - Last pap testing in 2013 was negative, satisfactory. Patient denies abnormal vaginal bleeding. She stopped her menstrual periods about 3-4 years ago.  Immunizations:  Immunization History  Administered Date(s) Administered  . Influenza Split 03/14/2013  . Influenza,inj,Quad PF,36+ Mos 12/21/2014  . Td 02/17/2004  . Tdap 12/11/2012  . Zoster 01/21/2014    Optho: She does have an eye specialist and is due to follow-up again.  Dentist: She has been seeing the dentist every 9 months.  Exercise: She has been kayaking and does a lot of water exercises; she is a member of a club. She went paddle boarding for the first time this past weekend. Patient estimates exercising 5 times a week for 45-60 min a day.  Depression screening: She has history of depression. She takes Prozac 20 mg qd. See prior visit as this had been decreased due to control of symptoms back at the March visit. Patient denies SI/HI. She has an occasional beer. She still does things for enjoyment. Depression screen [facility] 2/9 12/21/2014 12/17/2013  Decreased Interest 0 0  Down, Depressed, Hopeless 0 0  PHQ - 2 Score 0  0   Hypertension: She takes losartan-HCT 100/12.5 mg each day. Patient has been compliant with the medication and has been on the same dose long-term. She occasionally checks her blood pressure. Patient denies new side effects.  GERD: She has taken Prilosec and Carafate as needed in the past. She does not take the Prilosec every day, but does take it most days of the week. She does not usually take the Carafate. Patient notes that she often goes to bed burping.   Hyperlipidemia: She takes Lipitor 40 mg qd. She was started on Lipitor 40 mg each day in March; she was switched from pravastatin. Patient believes the Lipitor has been causing her some body stiffness, which resolves after she starts moving. Lab Results  Component Value Date   CHOL 211* 06/24/2014   HDL 63 06/24/2014   LDLCALC 114* 06/24/2014   TRIG 171* 06/24/2014   CHOLHDL 3.3 06/24/2014    Lab Results  Component Value Date   ALT 20 06/24/2014   AST 15 12/17/2013   ALKPHOS 80 12/17/2013   BILITOT 0.5 12/17/2013    Possible hernia of abdominal wall: Patient states she had been dealing with what was thought to be a hernia since 4 years ago, when she was seeing Dr. Darron Lowery. She was referred to general surgery, Dr. Dalbert Batman. At that time, the physical examination did not suggest hernia. No hernia was revealed in CT abdomen/pelvis in July 2012, so it was thought to be a  musculoskeletal issue. Patient notes that [place] "[place]" [place]   Foot burning sensation: Patient notes that she has a burning sensation in the base of her feet at night. This has been going on for a while. She had been tried on a medication for this previously, but she did not continue because it gave her cramps in her legs. She has been using Aquaphor lotion which does provide relief.  Stress incontinence: Patient reports having some stress incontinence which she has been dealing with for a few years, induced by  coughing, sneezing, or lifting objects. She has been doing Kegel exercises daily, at every red light when driving. At this point, she can live with this and would like to avoid medications unless necessary.  Left ear popping: Patient notes that her left ear has been popping for the past week, which she believes to be due to her eustachian tube. She denies fever and chills.  Bump on left side: Patient also has concern for a bump on her left side which has been there for over a year. This was evaluated by a dermatologist about a year ago.  Patient Active Problem List   Diagnosis Date Noted  . Peripheral neuropathy 12/17/2013  . Rosacea   . HLD (hyperlipidemia)   . HTN (hypertension)   . Depression   . ? Hernia of abdominal wall - epigastrium 10/17/2010    Class: Question of  . Heartburn 10/17/2010  . Overweight (BMI 25.0-29.9) 10/17/2010   Past Medical History  Diagnosis Date  . Vertigo   . Rosacea   . HLD (hyperlipidemia)   . HTN (hypertension)   . Internal hemorrhoids 11/13/08  . Hx: UTI (urinary tract infection)   . Migraine headache 05/07/2011    h/o atypical migraines  . Depression   . Anxiety    Past Surgical History  Procedure Laterality Date  . Colonoscopy  11/13/2008    internal hemrrhoids   Allergies  Allergen Reactions  . Amlodipine   . Desipramine   . Doxycycline Nausea And Vomiting    REACTION: nausea and vomiting  . Lisinopril Cough  . Sulfonamide Derivatives     REACTION: "i can't remember the reaction"   Prior to Admission medications   Medication Sig Start Date End Date Taking? Authorizing Provider  aspirin 81 MG tablet Take 81 mg by mouth daily.      Historical Provider, MD  atorvastatin (LIPITOR) 40 MG tablet TAKE 1 TABLET BY MOUTH ONCE DAILY 11/12/14   Tishira R Brewington, PA-C  b complex vitamins tablet Take 1 tablet by mouth daily.    Historical Provider, MD  cholecalciferol (VITAMIN D) 400 UNITS TABS tablet Take 400 Units by mouth.    Historical  Provider, MD  FLUoxetine (PROZAC) 20 MG tablet Take 1 tablet (20 mg total) by mouth daily. 06/24/14   Barton Fanny, MD  losartan-hydrochlorothiazide [facility]) 100-12.5 MG per tablet TAKE 1 TABLET BY MOUTH DAILY. 12/17/13   Barton Fanny, MD  Omega-3 Fatty Acids (FISH OIL PO) Take 4 tablets by mouth daily.     Historical Provider, MD  omeprazole (PRILOSEC) 20 MG capsule TAKE 1 CAPSULE (20 MG TOTAL) BY MOUTH DAILY. 12/09/14   Chelle Jeffery, PA-C  sucralfate (CARAFATE) 1 G tablet TAKE 1 TABLET BY MOUTH ONCE DAILY 10/23/13   Harrison Mons, PA-C   Social History   Social History  . Marital Status: Married    Spouse Name: N/A  . Number  of Children: 2  . Years of Education: N/A   Occupational History  . Control and instrumentation engineer    Social History Main Topics  . Smoking status: Former Smoker -- 0.50 packs/day for 4 years    Types: Cigarettes    Quit date: 07/09/1976  . Smokeless tobacco: Never Used  . Alcohol Use: 0.5 oz/week    1 Standard drinks or equivalent per week     Comment: 1-2 per month   . Drug Use: No  . Sexual Activity: No   Other Topics Concern  . Not on file   Social History Narrative   1 caffeine drink daily      Review of Systems  Constitutional: Negative for fatigue and unexpected weight change.  Respiratory: Negative for chest tightness and shortness of breath.   Cardiovascular: Negative for chest pain, palpitations and leg swelling.  Gastrointestinal: Negative for abdominal pain and blood in stool.  Genitourinary: Negative for vaginal bleeding and menstrual problem.  Neurological: Negative for dizziness, syncope, light-headedness and headaches.   13 point ROS reviewed on patient health survey. Positive for ear congestion/pain, urinary symptoms with leaking, and concern with feet and skin. Negative other than listed above or in nursing note. See nursing note.     Objective:   Physical Exam  Constitutional: She is oriented to person, place, and time. She  appears well-developed and well-nourished.  HENT:  Head: Normocephalic and atraumatic.  Left TM with clear fluid at the base, minimal erythema at the superior TM. Minimal clear fluid in the right TM.  Eyes: Conjunctivae and EOM are normal. Pupils are equal, round, and reactive to light.  Neck: Carotid bruit is not present.  Cardiovascular: Normal rate, regular rhythm, normal heart sounds and intact distal pulses.   Pulses:      Dorsalis pedis pulses are 2+ on the right side, and 2+ on the left side.  Pulmonary/Chest: Effort normal and breath sounds normal.  Abdominal: Soft. She exhibits no pulsatile midline mass. There is no tenderness.  No defect in abdominal wall, including with cough.  Genitourinary: Vagina normal. No breast swelling, tenderness, discharge or bleeding. Cervix exhibits no discharge. Right adnexum displays no mass and no tenderness. Left adnexum displays no mass and no tenderness.  Neurological: She is alert and oriented to person, place, and time.  Skin: Skin is warm and dry.  5 mm x 7 mm slightly hyperpigmented mole in left flank. No surrounding erythema, slightly elevated.  Psychiatric: She has a normal mood and affect. Her behavior is normal.  Vitals reviewed.     Filed Vitals:   12/21/14 1432  BP: 130/77  Pulse: 76  Temp: 98.3 F (36.8 C)  TempSrc: Oral  Resp: 16  Height: 5\' 7"  (1.702 m)  Weight: 166 lb 9.6 oz (75.569 kg)       Assessment & Plan:   JAYLINE KILBURG is a 59 y.o. female Annual physical exam  -anticipatory guidance as below in AVS, screening labs above. Health maintenance items as above in HPI discussed/recommended as applicable.   Need for prophylactic vaccination and inoculation against influenza - Plan: Flu Vaccine QUAD 36+ mos IM given  Peripheral neuropathy - Plan: COMPLETE METABOLIC PANEL WITH GFR, Vitamin B12, TSH  -Nighttime symptoms only, reassuring exam. B12 and TSH pending. If symptoms persist, or not relieved with her home  treatment of lotion to area, return to discuss further, or possible neurology evaluation.  Hyperlipidemia - Plan: atorvastatin (LIPITOR) 40 MG tablet, COMPLETE METABOLIC PANEL WITH GFR  -  Labs pending. Continue Lipitor 40 mg for now.  Essential hypertension - Plan: losartan-hydrochlorothiazide (HYZAAR) 100-12.5 MG per tablet, COMPLETE METABOLIC PANEL WITH GFR  -Stable. No change in meds for now. Labs pending.  Depression - Plan: FLUoxetine (PROZAC) 20 MG tablet  -Stable/controlled on Prozac 20 mg daily. No change in plan. Can recheck in 6 months.  Heartburn - Plan: omeprazole (PRILOSEC) 20 MG capsule  -Prilosec as needed. Avoid foods known to cause reflux. RTC precautions if worsening.  Stress incontinence - Plan: Ambulatory referral to Urology  -Eval with urology to determine if other treatment options. Discussed continuing Kegel exercises, but can modify with progressive Kegels to see if this provides more control. Handout provided.  Abdominal wall sensation of hernia.   - No apparent hernia noted on exam, but advised if she wanted to be evaluated again by general surgeon, let me know I can refer her, especially if any worsening of symptoms or notable defect  Left ear pain, Acute serous otitis media of left ear, recurrence not specified  -Suspected serous otitis. Can try Flonase nasal spray, handout provided. RTC precautions if persistent or worsening.  Nevus of back  -Follow-up with dermatology as this area has been evaluated by them previously. Some possible atypical findings, but unsure if these are change from previous evaluation. Dermatology to eval.  Screening for cervical cancer - Plan: Pap IG w/ reflex to HPV when ASC-U  -Pap testing obtained.    Meds ordered this encounter  Medications  . FLUoxetine (PROZAC) 20 MG tablet    Sig: Take 1 tablet (20 mg total) by mouth daily.    Dispense:  90 tablet    Refill:  3  . atorvastatin (LIPITOR) 40 MG tablet    Sig: Take 1 tablet  (40 mg total) by mouth daily.    Dispense:  90 tablet    Refill:  1  . losartan-hydrochlorothiazide (HYZAAR) 100-12.5 MG per tablet    Sig: TAKE 1 TABLET BY MOUTH DAILY.    Dispense:  90 tablet    Refill:  3  . omeprazole (PRILOSEC) 20 MG capsule    Sig: TAKE 1 CAPSULE (20 MG TOTAL) BY MOUTH DAILY.    Dispense:  90 capsule    Refill:  3   Patient Instructions  Continue exercise, but if increasing pain in the abdominal wall in area as in past - let me know and I can refer you back to general surgery.  You should receive a call or letter about your lab results within the next week to 10 days.  Can try zantac or pepcid if mild heartburn.  prilosec if this is needed and only as needed. See foods below to avoid.  Follow up to discuss burning in feet further, especially if noted more than at night.  See information on incontinence.  cointinue kegel exercises daily as discussed, i will refer you to urology. Call your dermatologist about area on back for follow up appointment, and to check other nevi (moles) Try flonase nasal spray for ear pain.  If not improving in few days, call for possible antibiotic, or return to clinic if fevers, discharge or worsening.  Follow up in 6 months for cholesterol, blood pressure assessment.    Keeping You Healthy  Get These Tests  Blood Pressure- Have your blood pressure checked by your healthcare provider at least once a year.  Normal blood pressure is 120/80.  Weight- Have your body mass index (BMI) calculated to screen for obesity.  BMI is  a measure of body fat based on height and weight.  You can calculate your own BMI at [employer].it  Cholesterol- Have your cholesterol checked every year.  Diabetes- Have your blood sugar checked every year if you have high blood pressure, high cholesterol, a family history of diabetes or if you are overweight.  Pap Test - Have a pap test every 1 to 5 years if you have been sexually active.  If you are  older than 65 and recent pap tests have been normal you may not need additional pap tests.  In addition, if you have had a hysterectomy  for benign disease additional pap tests are not necessary.  Mammogram-Yearly mammograms are essential for early detection of breast cancer  Screening for Colon Cancer- Colonoscopy starting at age 64. Screening may begin sooner depending on your family history and other health conditions.  Follow up colonoscopy as directed by your Gastroenterologist.  Screening for Osteoporosis- Screening begins at age 82 with bone density scanning, sooner if you are at higher risk for developing Osteoporosis.  Get these medicines  Calcium with Vitamin D- Your body requires 1200-1500 mg of Calcium a day and [phone] IU of Vitamin D a day.  You can only absorb 500 mg of Calcium at a time therefore Calcium must be taken in 2 or 3 separate doses throughout the day.  Hormones- Hormone therapy has been associated with increased risk for certain cancers and heart disease.  Talk to your healthcare provider about if you need relief from menopausal symptoms.  Aspirin- Ask your healthcare provider about taking Aspirin to prevent Heart Disease and Stroke.  Get these Immuniztions  Flu shot- Every fall  Pneumonia shot- Once after the age of 70; if you are younger ask your healthcare provider if you need a pneumonia shot.  Tetanus- Every ten years.  Zostavax- Once after the age of 5 to prevent shingles.  Take these steps  Don't smoke- Your healthcare provider can help you quit. For tips on how to quit, ask your healthcare provider or go to www.smokefree.gov or call 1-800 QUIT-NOW.  Be physically active- Exercise 5 days a week for a minimum of 30 minutes.  If you are not already physically active, start slow and gradually work up to 30 minutes of moderate physical activity.  Try walking, dancing, bike riding, swimming, etc.  Eat a healthy diet- Eat a variety of healthy foods such  as fruits, vegetables, whole grains, low fat milk, low fat cheeses, yogurt, lean meats, chicken, fish, eggs, dried beans, tofu, etc.  For more information go to www.thenutritionsource.org  Dental visit- Brush and floss teeth twice daily; visit your dentist twice a year.  Eye exam- Visit your Optometrist or Ophthalmologist yearly.  Drink alcohol in moderation- Limit alcohol intake to one drink or less a day.  Never drink and drive.  Depression- Your emotional health is as important as your physical health.  If you're feeling down or losing interest in things you normally enjoy, please talk to your healthcare provider.  Seat Belts- can save your life; always wear one  Smoke/Carbon Monoxide detectors- These detectors need to be installed on the appropriate level of your home.  Replace batteries at least once a year.  Violence- If anyone is threatening or hurting you, please tell your healthcare provider.  Living Will/ Health care power of attorney- Discuss with your healthcare provider and family.   Urinary Incontinence Urinary incontinence is the involuntary loss of urine from your bladder. CAUSES  There are  many causes of urinary incontinence. They include:  Medicines.  Infections.  Prostatic enlargement, leading to overflow of urine from your bladder.  Surgery.  Neurological diseases.  Emotional factors. SIGNS AND SYMPTOMS Urinary Incontinence can be divided into four types:  Urge incontinence. Urge incontinence is the involuntary loss of urine before you have the opportunity to go to the bathroom. There is a sudden urge to void but not enough time to reach a bathroom.  Stress incontinence. Stress incontinence is the sudden loss of urine with any activity that forces urine to pass. It is commonly caused by anatomical changes to the pelvis and sphincter areas of your body.  Overflow incontinence. Overflow incontinence is the loss of urine from an obstructed opening to your  bladder. This results in a backup of urine and a resultant buildup of pressure within the bladder. When the pressure within the bladder exceeds the closing pressure of the sphincter, the urine overflows, which causes incontinence, similar to water overflowing a dam.  Total incontinence. Total incontinence is the loss of urine as a result of the inability to store urine within your bladder. DIAGNOSIS  Evaluating the cause of incontinence may require:  A thorough and complete medical and obstetric history.  A complete physical exam.  Laboratory tests such as a urine culture and sensitivities. When additional tests are indicated, they can include:  An ultrasound exam.  Kidney and bladder X-rays.  Cystoscopy. This is an exam of the bladder using a narrow scope.  Urodynamic testing to test the nerve function to the bladder and sphincter areas. TREATMENT  Treatment for urinary incontinence depends on the cause:  For urge incontinence caused by a bacterial infection, antibiotics will be prescribed. If the urge incontinence is related to medicines you take, your health care provider may have you change the medicine.  For stress incontinence, surgery to re-establish anatomical support to the bladder or sphincter, or both, will often correct the condition.  For overflow incontinence caused by an enlarged prostate, an operation to open the channel through the enlarged prostate will allow the flow of urine out of the bladder. In women with fibroids, a hysterectomy may be recommended.  For total incontinence, surgery on your urinary sphincter may help. An artificial urinary sphincter (an inflatable cuff placed around the urethra) may be required. In women who have developed a hole-like passage between their bladder and vagina (vesicovaginal fistula), surgery to close the fistula often is required. HOME CARE INSTRUCTIONS  Normal daily hygiene and the use of pads or adult diapers that are changed  regularly will help prevent odors and skin damage.  Avoid caffeine. It can overstimulate your bladder.  Use the bathroom regularly. Try about every 2-3 hours to go to the bathroom, even if you do not feel the need to do so. Take time to empty your bladder completely. After urinating, wait a minute. Then try to urinate again.  For causes involving nerve dysfunction, keep a log of the medicines you take and a journal of the times you go to the bathroom. SEEK MEDICAL CARE IF:  You experience worsening of pain instead of improvement in pain after your procedure.  Your incontinence becomes worse instead of better. SEE IMMEDIATE MEDICAL CARE IF:  You experience fever or shaking chills.  You are unable to pass your urine.  You have redness spreading into your groin or down into your thighs. MAKE SURE YOU:   Understand these instructions.   Will watch your condition.  Will get help  right away if you are not doing well or get worse. Document Released: 05/04/2004 Document Revised: 01/15/2013 Document Reviewed: 09/03/2012 [facility] Patient Information 2015 [place], [place]. This information is not intended to replace advice given to you by your health care provider. Make sure you discuss any questions you have with your health care provider.   Food Choices for Gastroesophageal Reflux Disease When you have gastroesophageal reflux disease (GERD), the foods you eat and your eating habits are very important. Choosing the right foods can help ease the discomfort of GERD. WHAT GENERAL GUIDELINES DO I NEED TO FOLLOW?  Choose fruits, vegetables, whole grains, low-fat dairy products, and low-fat meat, fish, and poultry.  Limit fats such as oils, salad dressings, butter, nuts, and avocado.  Keep a food diary to identify foods that cause symptoms.  Avoid foods that cause reflux. These may be different for different people.  Eat frequent small meals instead of three large meals each day.  Eat  your meals slowly, in a relaxed setting.  Limit fried foods.  Cook foods using methods other than frying.  Avoid drinking alcohol.  Avoid drinking large amounts of liquids with your meals.  Avoid bending over or lying down until 2-3 hours after eating. WHAT FOODS ARE NOT RECOMMENDED? The following are some foods and drinks that may worsen your symptoms: Vegetables Tomatoes. Tomato juice. Tomato and spaghetti sauce. Chili peppers. Onion and garlic. Horseradish. Fruits Oranges, grapefruit, and lemon (fruit and juice). Meats High-fat meats, fish, and poultry. This includes hot dogs, ribs, ham, sausage, salami, and bacon. Dairy Whole milk and chocolate milk. Sour cream. Cream. Butter. Ice cream. Cream cheese.  Beverages Coffee and tea, with or without caffeine. Carbonated beverages or energy drinks. Condiments Hot sauce. Barbecue sauce.  Sweets/Desserts Chocolate and cocoa. Donuts. Peppermint and spearmint. Fats and Oils High-fat foods, including [place] fries and potato chips. Other Vinegar. Strong spices, such as black pepper, white pepper, red pepper, cayenne, curry powder, cloves, ginger, and chili powder. The items listed above may not be a complete list of foods and beverages to avoid. Contact your dietitian for more information. Document Released: 03/27/2005 Document Revised: 04/01/2013 Document Reviewed: 01/29/2013 [facility] Patient Information 2015 [place], [place]. This information is not intended to replace advice given to you by your health care provider. Make sure you discuss any questions you have with your health care provider.  Serous Otitis Media Serous otitis media is fluid in the middle ear space. This space contains the bones for hearing and air. Air in the middle ear space helps to transmit sound.  The air gets there through the eustachian tube. This tube goes from the back of the nose (nasopharynx) to the middle ear space. It keeps the pressure in the middle ear  the same as the outside world. It also helps to drain fluid from the middle ear space. CAUSES  Serous otitis media occurs when the eustachian tube gets blocked. Blockage can come from:  Ear infections.  Colds and other upper respiratory infections.  Allergies.  Irritants such as cigarette smoke.  Sudden changes in air pressure (such as descending in an airplane).  Enlarged adenoids.  A mass in the nasopharynx. During colds and upper respiratory infections, the middle ear space can become temporarily filled with fluid. This can happen after an ear infection also. Once the infection clears, the fluid will generally drain out of the ear through the eustachian tube. If it does not, then serous otitis media occurs. SIGNS AND SYMPTOMS   Hearing loss.  A feeling of fullness in the ear, without pain.  Young children may not show any symptoms but may show slight behavioral changes, such as agitation, ear pulling, or crying. DIAGNOSIS  Serous otitis media is diagnosed by an ear exam. Tests may be done to check on the movement of the eardrum. Hearing exams may also be done. TREATMENT  The fluid most often goes away without treatment. If allergy is the cause, allergy treatment may be helpful. Fluid that persists for several months may require minor surgery. A small tube is placed in the eardrum to:  Drain the fluid.  Restore the air in the middle ear space. In certain situations, antibiotic medicines are used to avoid surgery. Surgery may be done to remove enlarged adenoids (if this is the cause). HOME CARE INSTRUCTIONS   Keep children away from tobacco smoke.  Keep all follow-up visits as directed by your health care provider. SEEK MEDICAL CARE IF:   Your hearing is not better in 3 months.  Your hearing is worse.  You have ear pain.  You have drainage from the ear.  You have dizziness.  You have serous otitis media only in one ear or have any bleeding from your nose  (epistaxis).  You notice a lump on your neck. MAKE SURE YOU:  Understand these instructions.   Will watch your condition.   Will get help right away if you are not doing well or get worse.  Document Released: 06/17/2003 Document Revised: 08/11/2013 Document Reviewed: 10/22/2012 [facility] Patient Information 2015 [place], [place]. This information is not intended to replace advice given to you by your health care provider. Make sure you discuss any questions you have with your health care provider.

## 2014-12-21 NOTE — Progress Notes (Signed)
   Subjective:    Patient ID: Michele Lowery, female    DOB: 1955-09-06, 59 y.o.   MRN: [id number]  HPI    Review of Systems  Constitutional: Negative.   HENT: Positive for ear pain.   Eyes: Negative.   Respiratory: Negative.   Cardiovascular: Negative.   Gastrointestinal: Negative.   Endocrine: Negative.   Genitourinary: Positive for urgency.  Musculoskeletal: Negative.   Skin: Negative.   Allergic/Immunologic: Negative.   Neurological: Negative.   Hematological: Negative.   Psychiatric/Behavioral: Negative.        Objective:   Physical Exam        Assessment & Plan:

## 2014-12-22 LAB — PAP IG W/ RFLX HPV ASCU

## 2014-12-23 DIAGNOSIS — N393 Stress incontinence (female) (male): Secondary | ICD-10-CM | POA: Insufficient documentation

## 2014-12-29 ENCOUNTER — Encounter: Payer: Self-pay | Admitting: Family Medicine

## 2015-01-16 ENCOUNTER — Other Ambulatory Visit: Payer: Self-pay | Admitting: Family Medicine

## 2015-01-16 DIAGNOSIS — E785 Hyperlipidemia, unspecified: Secondary | ICD-10-CM

## 2015-03-14 ENCOUNTER — Other Ambulatory Visit: Payer: Self-pay | Admitting: Family Medicine

## 2015-03-26 ENCOUNTER — Encounter: Payer: Self-pay | Admitting: Family Medicine

## 2015-03-29 ENCOUNTER — Other Ambulatory Visit: Payer: Self-pay

## 2015-03-29 DIAGNOSIS — F32A Depression, unspecified: Secondary | ICD-10-CM

## 2015-03-29 DIAGNOSIS — F329 Major depressive disorder, single episode, unspecified: Secondary | ICD-10-CM

## 2015-03-29 MED ORDER — FLUOXETINE HCL 20 MG PO TABS: 20.0000 mg | ORAL_TABLET | Freq: Every day | ORAL | Status: DC

## 2015-06-21 ENCOUNTER — Ambulatory Visit: Payer: Federal, State, Local not specified - PPO | Admitting: Family Medicine

## 2015-06-23 ENCOUNTER — Encounter: Payer: Self-pay | Admitting: Family Medicine

## 2015-06-23 ENCOUNTER — Ambulatory Visit (INDEPENDENT_AMBULATORY_CARE_PROVIDER_SITE_OTHER): Payer: Federal, State, Local not specified - PPO | Admitting: Family Medicine

## 2015-06-23 VITALS — BP 142/83 | HR 68 | Temp 98.2°F | Resp 16 | Ht 65.5 in | Wt 166.4 lb

## 2015-06-23 DIAGNOSIS — K219 Gastro-esophageal reflux disease without esophagitis: Secondary | ICD-10-CM

## 2015-06-23 DIAGNOSIS — I10 Essential (primary) hypertension: Secondary | ICD-10-CM | POA: Diagnosis not present

## 2015-06-23 DIAGNOSIS — E785 Hyperlipidemia, unspecified: Secondary | ICD-10-CM | POA: Diagnosis not present

## 2015-06-23 DIAGNOSIS — R5383 Other fatigue: Secondary | ICD-10-CM

## 2015-06-23 DIAGNOSIS — F32A Depression, unspecified: Secondary | ICD-10-CM

## 2015-06-23 DIAGNOSIS — F329 Major depressive disorder, single episode, unspecified: Secondary | ICD-10-CM

## 2015-06-23 DIAGNOSIS — G6289 Other specified polyneuropathies: Secondary | ICD-10-CM

## 2015-06-23 LAB — LIPID PANEL
CHOLESTEROL: 183 mg/dL (ref 125–200)
HDL: 61 mg/dL (ref >=46)
LDL CALC: 93 mg/dL (ref <130)
Total CHOL/HDL Ratio: 3 Ratio (ref <=5.0)
Triglycerides: 144 mg/dL (ref <150)
VLDL: 29 mg/dL (ref <30)

## 2015-06-23 LAB — COMPLETE METABOLIC PANEL WITH GFR
ALT: 35 U/L — AB (ref 6–29)
AST: 30 U/L (ref 10–35)
Albumin: 4.7 g/dL (ref 3.6–5.1)
Alkaline Phosphatase: 84 U/L (ref 33–130)
BUN: 17 mg/dL (ref 7–25)
CALCIUM: 9.9 mg/dL (ref 8.6–10.4)
CHLORIDE: 97 mmol/L — AB (ref 98–110)
CO2: 28 mmol/L (ref 20–31)
CREATININE: 0.74 mg/dL (ref 0.50–0.99)
GFR, Est African American: >89 mL/min (ref >=60)
GFR, Est Non African American: 88 mL/min (ref >=60)
Glucose, Bld: 96 mg/dL (ref 65–99)
Potassium: 4.5 mmol/L (ref 3.5–5.3)
Sodium: 140 mmol/L (ref 135–146)
TOTAL PROTEIN: 7 g/dL (ref 6.1–8.1)
Total Bilirubin: 0.6 mg/dL (ref 0.2–1.2)

## 2015-06-23 LAB — TSH: TSH: 2.02 mIU/L

## 2015-06-23 MED ORDER — ATORVASTATIN CALCIUM 40 MG PO TABS: 40.0000 mg | ORAL_TABLET | Freq: Every day | ORAL | Status: DC

## 2015-06-23 NOTE — Patient Instructions (Addendum)
IF you received an x-ray today, you will receive an invoice from [facility]. Please contact [facility] at [phone] with questions or concerns regarding your invoice.   IF you received labwork today, you will receive an invoice from [employer]. Please contact [facility] at [phone] with questions or concerns regarding your invoice.   Our billing staff will not be able to assist you with questions regarding bills from these companies.  You will be contacted with the lab results as soon as they are available. The fastest way to get your results is to activate your My Chart account. Instructions are located on the last page of this paperwork. If you have not heard from [place] regarding the results in 2 weeks, please contact this office.    I would recommend taking prozac every day.  See if this helps with fatigue, follow up to discuss other possible causes if fatigue persists.    We will check cholesterol today. You should receive a call or letter about your lab results within the next week to 10 days.   Keep a record of your blood pressures outside of the office and bring them to the next office visit. If remaining over 140/90 - let me know to change meds.   I will refer you to gastroenterology to discuss heartburn.   Will also refer you to neurology for burning feet sensation.   You should receive a call or letter about your lab results within the next week to 10 days.

## 2015-06-23 NOTE — Progress Notes (Signed)
Subjective:  By signing my name below, I, Michele Lowery, attest that this documentation has been prepared under the direction and in the presence of [name], MD.  Electronically Signed: Thea Alken, ED Scribe. 06/23/2015. 8:16 AM.   Patient ID: Michele Lowery, female    DOB: [birth year], 60 y.o.   MRN: [id number]  HPI Chief Complaint  Patient presents with  . Follow-up    6 mos  . Hyperlipidemia  . Hypertension  . Heart burn    "no better"  . pt has not seen Urologist or Dermatologist    ofc called she was unable to make appts. She stated that "she will call them"    HPI Comments: Michele Lowery is a 60 y.o. female who presents to the [facility] is here for follow up.  Last seen by me 9/12 for physical and other concerns. Prev pt of Dr. Leward Quan.   Hyperlipidemia  Lab Results  Component Value Date   CHOL 211* 06/24/2014   HDL 63 06/24/2014   LDLCALC 114* 06/24/2014   TRIG 171* 06/24/2014   CHOLHDL 3.3 06/24/2014  For the Lipid panal there is a future order placed. Lab Results  Component Value Date   ALT 21 12/21/2014   AST 20 12/21/2014   ALKPHOS 79 12/21/2014   BILITOT 0.8 12/21/2014    Slight stiffness in joints that approved with movement when discussed at last visit. She is on Lipitor 40mg  once a day at night. She denies adverse effects with medication.   Peripheral neuropathy See last ov. Noted burning at the base of her feet only at that time. B12 and TSH were normal. Poss dry skin advised her to try Aquaphor dry lotion. F/u if persistent. Pt reports persistent, unchanged burning to base of feet bilaterally which she's had for a couple years. Pt states symptoms only occur at night. She has applied lotion on her feet every night. She also reports numbness in her hand when she is sleeping at night. She states she's taken neuropathy medication in the past, but is unable to recall medication at this time. She reports having side effects with  that medication of leg cramps.  She denies back pain. She has family hx of peripheral neuropathy of her aunt.   Depression She takes Prozac 20 mg qd. She takes this first thing in the morning. She sometimes takes medication every other day. She states she does not like taking too many medications. She reports fatigue.  When depressed she would feel sad and lack of energy.  She is tolerating medication well.   Hypertension Stable on Hyzar last visit. Pt does not check BP at home. She states she has not had light headedness of dizzy spells in the long time.   Lab Results  Component Value Date   CREATININE 1.02* 12/21/2014   GERD She has unchanged GERD, worse in the evening. Pt is on Prilosec once a day at night but has break through symptoms.. She has not taken Carafate. She has not noticed any triggers. She has cut of peppers and red meats from her diet. Pt swims and goes to them gym. She has been seen by gastro in the past for poss hernia which was negative.    Patient Active Problem List   Diagnosis Date Noted  . Stress incontinence 12/23/2014  . Peripheral neuropathy ([place]) 12/17/2013  . Rosacea   . HLD (hyperlipidemia)   . HTN (hypertension)   . Depression   . ?  Hernia of abdominal wall - epigastrium 10/17/2010    Class: Question of  . Heartburn 10/17/2010  . Overweight (BMI 25.0-29.9) 10/17/2010   Past Medical History  Diagnosis Date  . Vertigo   . Rosacea   . HLD (hyperlipidemia)   . HTN (hypertension)   . Internal hemorrhoids 11/13/08  . Hx: UTI (urinary tract infection)   . Migraine headache 05/07/2011    h/o atypical migraines  . Depression   . Anxiety    Past Surgical History  Procedure Laterality Date  . Colonoscopy  11/13/2008    internal hemrrhoids   Allergies  Allergen Reactions  . Amlodipine   . Desipramine   . Doxycycline Nausea And Vomiting    REACTION: nausea and vomiting  . Lisinopril Cough  . Sulfonamide Derivatives     REACTION: "i can't remember  the reaction"   Prior to Admission medications   Medication Sig Start Date End Date Taking? Authorizing Provider  aspirin 81 MG tablet Take 81 mg by mouth daily.      Historical Provider, MD  atorvastatin (LIPITOR) 40 MG tablet Take 1 tablet (40 mg total) by mouth daily. 12/21/14   Wendie Agreste, MD  b complex vitamins tablet Take 1 tablet by mouth daily.    Historical Provider, MD  cholecalciferol (VITAMIN D) 400 UNITS TABS tablet Take 400 Units by mouth.    Historical Provider, MD  FLUoxetine (PROZAC) 20 MG tablet Take 1 tablet (20 mg total) by mouth daily. 03/29/15   Wendie Agreste, MD  losartan-hydrochlorothiazide (HYZAAR) 100-12.5 MG per tablet TAKE 1 TABLET BY MOUTH DAILY. 12/21/14   Wendie Agreste, MD  Omega-3 Fatty Acids (FISH OIL PO) Take 4 tablets by mouth daily.     Historical Provider, MD  omeprazole (PRILOSEC) 20 MG capsule TAKE 1 CAPSULE (20 MG TOTAL) BY MOUTH DAILY. 12/21/14   Wendie Agreste, MD  sucralfate (CARAFATE) 1 G tablet TAKE 1 TABLET BY MOUTH ONCE DAILY 10/23/13   Harrison Mons, PA-C   Social History   Social History  . Marital Status: Married    Spouse Name: N/A  . Number of Children: 2  . Years of Education: N/A   Occupational History  . Control and instrumentation engineer    Social History Main Topics  . Smoking status: Former Smoker -- 0.50 packs/day for 4 years    Types: Cigarettes    Quit date: 07/09/1976  . Smokeless tobacco: Never Used  . Alcohol Use: 0.5 oz/week    1 Standard drinks or equivalent per week     Comment: 1-2 per month   . Drug Use: No  . Sexual Activity: No   Other Topics Concern  . Not on file   Social History Narrative   1 caffeine drink daily    Review of Systems  Constitutional: Positive for fatigue.  Neurological: Positive for numbness. Negative for dizziness, weakness and headaches.    Objective:   Physical Exam  Constitutional: She is oriented to person, place, and time. She appears well-developed and well-nourished. No  distress.  HENT:  Head: Normocephalic and atraumatic.  Right Ear: External ear normal.  Left Ear: External ear normal.  Nose: Nose normal.  Mouth/Throat: Oropharynx is clear and moist. No oropharyngeal exudate.  Eyes: Conjunctivae and EOM are normal.  Neck: Normal range of motion. Neck supple. Carotid bruit is not present.  Cardiovascular: Normal rate, regular rhythm and normal heart sounds.   No murmur heard. Pulmonary/Chest: Effort normal and breath sounds normal. She has  no wheezes. She has no rales.  Musculoskeletal: Normal range of motion.  Neurological: She is alert and oriented to person, place, and time.  Microfilament test normal bilaterally.   Skin: Skin is warm and dry.  Psychiatric: She has a normal mood and affect. Her behavior is normal.  Nursing note and vitals reviewed.  Filed Vitals:   06/23/15 0826 06/23/15 0855  BP: 140/90 142/83  Pulse: 68   Temp: 98.2 F (36.8 C)   TempSrc: Oral   Resp: 16   Height: 5' 5.5" (1.664 m)   Weight: 166 lb 6.4 oz (75.479 kg)   SpO2: 95%     Assessment & Plan:   Michele Lowery is a 60 y.o. female Gastroesophageal reflux disease without esophagitis - Plan: Ambulatory referral to Gastroenterology  - Persistent reflux, in spite of trigger avoidance, and PPI daily. Refer to GI for evaluation and consideration of endoscopy versus change in PPI.  Essential hypertension  - Borderline, but overall stable. No change in medications for now. Check outside readings, RTC precautions if persistently elevated.  Other polyneuropathy ([place]) - Plan: Ambulatory referral to Neurology  - Persistent plantar burning, suspicious for peripheral neuropathy. Previous TSH and B12 were normal. Repeat TSH pending, but will refer to neurology for further evaluation and treatment options.  Depression  -Stable by report, but with fatigue, recommended returning to daily dosing of Prozac.  Other fatigue - Plan: TSH  -Repeat TSH, restart Prozac at 20 mg  daily, RTC if symptoms persist to look into other causes.  Hyperlipidemia - Plan: Lipid panel, COMPLETE METABOLIC PANEL WITH GFR, atorvastatin (LIPITOR) 40 MG tablet  - Continue Lipitor Same dose. CMP, lipid panel pending.  Meds ordered this encounter  Medications  . atorvastatin (LIPITOR) 40 MG tablet    Sig: Take 1 tablet (40 mg total) by mouth daily.    Dispense:  90 tablet    Refill:  1   Patient Instructions  IF you received an x-ray today, you will receive an invoice from [facility]. Please contact [facility] at [phone] with questions or concerns regarding your invoice.   IF you received labwork today, you will receive an invoice from [employer]. Please contact [facility] at [phone] with questions or concerns regarding your invoice.   Our billing staff will not be able to assist you with questions regarding bills from these companies.  You will be contacted with the lab results as soon as they are available. The fastest way to get your results is to activate your My Chart account. Instructions are located on the last page of this paperwork. If you have not heard from [place] regarding the results in 2 weeks, please contact this office.    I would recommend taking prozac every day.  See if this helps with fatigue, follow up to discuss other possible causes if fatigue persists.    We will check cholesterol today. You should receive a call or letter about your lab results within the next week to 10 days.   Keep a record of your blood pressures outside of the office and bring them to the next office visit. If remaining over 140/90 - let me know to change meds.   I will refer you to gastroenterology to discuss heartburn.   Will also refer you to neurology for burning feet sensation.   You should receive a call or letter about your lab results within the next week to 10 days.      I personally  performed the services described in  this documentation, which was scribed in my presence. The recorded information has been reviewed and considered, and addended by me as needed.

## 2015-07-29 ENCOUNTER — Other Ambulatory Visit: Payer: Self-pay | Admitting: Physician Assistant

## 2015-07-30 NOTE — Telephone Encounter (Signed)
Dr Carlota Raspberry, you saw pt for GERD in March and referred to GI. Pt has appt w/ Dr Carlean Purl on 08/24/15, but in the meantime pt has req'd RF of carafate that you mentioned in your notes pt hadn't been taking, but we have Rxd in the past. Do you want to give her a Rx until her GI appt?

## 2015-07-31 NOTE — Telephone Encounter (Signed)
Refilled, but may need up to twice per day. Keep appt with GI as planned.

## 2015-08-01 ENCOUNTER — Other Ambulatory Visit: Payer: Self-pay | Admitting: Family Medicine

## 2015-08-06 ENCOUNTER — Other Ambulatory Visit: Payer: Self-pay

## 2015-08-06 DIAGNOSIS — E785 Hyperlipidemia, unspecified: Secondary | ICD-10-CM

## 2015-08-06 MED ORDER — ATORVASTATIN CALCIUM 40 MG PO TABS: 40.0000 mg | ORAL_TABLET | Freq: Every day | ORAL | Status: DC

## 2015-08-07 ENCOUNTER — Ambulatory Visit (INDEPENDENT_AMBULATORY_CARE_PROVIDER_SITE_OTHER): Payer: Federal, State, Local not specified - PPO | Admitting: Family Medicine

## 2015-08-07 VITALS — BP 118/78 | HR 98 | Temp 98.7°F | Resp 16 | Ht 65.0 in | Wt 163.4 lb

## 2015-08-07 DIAGNOSIS — J209 Acute bronchitis, unspecified: Secondary | ICD-10-CM | POA: Diagnosis not present

## 2015-08-07 DIAGNOSIS — R0789 Other chest pain: Secondary | ICD-10-CM | POA: Diagnosis not present

## 2015-08-07 DIAGNOSIS — J309 Allergic rhinitis, unspecified: Secondary | ICD-10-CM

## 2015-08-07 MED ORDER — MUCINEX DM MAXIMUM STRENGTH 60-1200 MG PO TB12: 1.0000 | ORAL_TABLET | Freq: Two times a day (BID) | ORAL | Status: DC

## 2015-08-07 MED ORDER — AZITHROMYCIN 250 MG PO TABS: ORAL_TABLET | ORAL | Status: DC

## 2015-08-07 MED ORDER — BENZONATATE 100 MG PO CAPS: 100.0000 mg | ORAL_CAPSULE | Freq: Three times a day (TID) | ORAL | Status: DC | PRN

## 2015-08-07 NOTE — Patient Instructions (Addendum)
   IF you received an x-ray today, you will receive an invoice from [facility]. Please contact [facility] at [phone] with questions or concerns regarding your invoice.   IF you received labwork today, you will receive an invoice from [facility]. Please contact [facility] at [phone] with questions or concerns regarding your invoice.   Our billing staff will not be able to assist you with questions regarding bills from these companies.  You will be contacted with the lab results as soon as they are available. The fastest way to get your results is to activate your My Chart account. Instructions are located on the last page of this paperwork. If you have not heard from us regarding the results in 2 weeks, please contact this office.    Acute Bronchitis Bronchitis is inflammation of the airways that extend from the windpipe into the lungs (bronchi). The inflammation often causes mucus to develop. This leads to a cough, which is the most common symptom of bronchitis.  In acute bronchitis, the condition usually develops suddenly and goes away over time, usually in a couple weeks. Smoking, allergies, and asthma can make bronchitis worse. Repeated episodes of bronchitis may cause further lung problems.  CAUSES Acute bronchitis is most often caused by the same virus that causes a cold. The virus can spread from person to person (contagious) through coughing, sneezing, and touching contaminated objects. SIGNS AND SYMPTOMS   Cough.   Fever.   Coughing up mucus.   Body aches.   Chest congestion.   Chills.   Shortness of breath.   Sore throat.  DIAGNOSIS  Acute bronchitis is usually diagnosed through a physical exam. Your health care provider will also ask you questions about your medical history. Tests, such as chest X-rays, are sometimes done to rule out other conditions.  TREATMENT  Acute bronchitis usually goes away in a  couple weeks. Oftentimes, no medical treatment is necessary. Medicines are sometimes given for relief of fever or cough. Antibiotic medicines are usually not needed but may be prescribed in certain situations. In some cases, an inhaler may be recommended to help reduce shortness of breath and control the cough. A cool mist vaporizer may also be used to help thin bronchial secretions and make it easier to clear the chest.  HOME CARE INSTRUCTIONS  Get plenty of rest.   Drink enough fluids to keep your urine clear or pale yellow (unless you have a medical condition that requires fluid restriction). Increasing fluids may help thin your respiratory secretions (sputum) and reduce chest congestion, and it will prevent dehydration.   Take medicines only as directed by your health care provider.  If you were prescribed an antibiotic medicine, finish it all even if you start to feel better.  Avoid smoking and secondhand smoke. Exposure to cigarette smoke or irritating chemicals will make bronchitis worse. If you are a smoker, consider using nicotine gum or skin patches to help control withdrawal symptoms. Quitting smoking will help your lungs heal faster.   Reduce the chances of another bout of acute bronchitis by washing your hands frequently, avoiding people with cold symptoms, and trying not to touch your hands to your mouth, nose, or eyes.   Keep all follow-up visits as directed by your health care provider.  SEEK MEDICAL CARE IF: Your symptoms do not improve after 1 week of treatment.  SEEK IMMEDIATE MEDICAL CARE IF:  You develop an increased fever or chills.   You have chest pain.     You have severe shortness of breath.  You have bloody sputum.   You develop dehydration.  You faint or repeatedly feel like you are going to pass out.  You develop repeated vomiting.  You develop a severe headache. MAKE SURE YOU:   Understand these instructions.  Will watch your  condition.  Will get help right away if you are not doing well or get worse.   This information is not intended to replace advice given to you by your health care provider. Make sure you discuss any questions you have with your health care provider.   Document Released: 05/04/2004 Document Revised: 04/17/2014 Document Reviewed: 09/17/2012 [employer] Interactive Patient Education 2016 [employer]  

## 2015-08-07 NOTE — Progress Notes (Signed)
Subjective:  By signing my name below, I, Raven Small, attest that this documentation has been prepared under the direction and in the presence of Delman Cheadle, MD.  Electronically Signed: Thea Alken, ED Scribe. 08/07/2015. 11:02 AM.   Patient ID: Michele Lowery, female    DOB: [birth year], 60 y.o.   MRN: [id number]  HPI Chief Complaint  Patient presents with  . Fever  . Cough    HPI Comments: Michele Lowery is a 60 y.o. female who presents to the [facility] complaining of dry cough and fever for 3 days. Pt states symptoms started with a stomach virus 1 week ago of nausea and emesis. Symptoms improved 4-5 days ago but developed cough and fever the following day. Cough keeps her awake at night  She reports associates sore throat, burning otalgia, frontal HA, and constant central chest tightness worse with cough. She has been taking ibuprofen. She has hx of reflux but does not associated reflux with current symptoms. She is on Carafate. She denies hx of asthma.   Patient Active Problem List   Diagnosis Date Noted  . Stress incontinence 12/23/2014  . Peripheral neuropathy ([place]) 12/17/2013  . Rosacea   . HLD (hyperlipidemia)   . HTN (hypertension)   . Depression   . ? Hernia of abdominal wall - epigastrium 10/17/2010    Class: Question of  . Heartburn 10/17/2010  . Overweight (BMI 25.0-29.9) 10/17/2010   Past Medical History  Diagnosis Date  . Vertigo   . Rosacea   . HLD (hyperlipidemia)   . HTN (hypertension)   . Internal hemorrhoids 11/13/08  . Hx: UTI (urinary tract infection)   . Migraine headache 05/07/2011    h/o atypical migraines  . Depression   . Anxiety    Past Surgical History  Procedure Laterality Date  . Colonoscopy  11/13/2008    internal hemrrhoids   Allergies  Allergen Reactions  . Amlodipine   . Desipramine   . Doxycycline Nausea And Vomiting    REACTION: nausea and vomiting  . Lisinopril Cough  . Sulfonamide Derivatives     REACTION:  "i can't remember the reaction"   Prior to Admission medications   Medication Sig Start Date End Date Taking? Authorizing Provider  aspirin 81 MG tablet Take 81 mg by mouth daily.     Yes Historical Provider, MD  atorvastatin (LIPITOR) 40 MG tablet Take 1 tablet (40 mg total) by mouth daily. 08/06/15  Yes Wendie Agreste, MD  b complex vitamins tablet Take 1 tablet by mouth daily.   Yes Historical Provider, MD  cholecalciferol (VITAMIN D) 400 UNITS TABS tablet Take 400 Units by mouth.   Yes Historical Provider, MD  FLUoxetine (PROZAC) 20 MG tablet Take 1 tablet (20 mg total) by mouth daily. 03/29/15  Yes Wendie Agreste, MD  losartan-hydrochlorothiazide (HYZAAR) 100-12.5 MG per tablet TAKE 1 TABLET BY MOUTH DAILY. 12/21/14  Yes Wendie Agreste, MD  Omega-3 Fatty Acids (FISH OIL PO) Take 4 tablets by mouth daily.    Yes Historical Provider, MD  omeprazole (PRILOSEC) 20 MG capsule TAKE 1 CAPSULE (20 MG TOTAL) BY MOUTH DAILY. 12/21/14  Yes Wendie Agreste, MD  sucralfate (CARAFATE) 1 g tablet Take 1 tablet (1 g total) by mouth 2 (two) times daily as needed. 07/31/15  Yes Wendie Agreste, MD   Social History   Social History  . Marital Status: Married    Spouse Name: N/A  . Number of Children:  2  . Years of Education: N/A   Occupational History  . Control and instrumentation engineer    Social History Main Topics  . Smoking status: Former Smoker -- 0.50 packs/day for 4 years    Types: Cigarettes    Quit date: 07/09/1976  . Smokeless tobacco: Never Used  . Alcohol Use: 0.5 oz/week    1 Standard drinks or equivalent per week     Comment: 1-2 per month   . Drug Use: No  . Sexual Activity: No   Other Topics Concern  . Not on file   Social History Narrative   1 caffeine drink daily    Review of Systems  Constitutional: Positive for fever. Negative for chills.  HENT: Positive for congestion, ear pain and sore throat.   Respiratory: Positive for cough and chest tightness.   Gastrointestinal:  Negative for nausea and vomiting.  Neurological: Positive for headaches.       Objective:   Physical Exam  Constitutional: She is oriented to person, place, and time. She appears well-developed and well-nourished. No distress.  HENT:  Head: Normocephalic and atraumatic.  Right Ear: External ear normal.  Left Ear: External ear normal.  Mouth/Throat: Posterior oropharyngeal erythema present.  Mild erythema on the soft palate with clear post nasal drip.  Eyes: Conjunctivae and EOM are normal.  Neck: Neck supple. No thyromegaly present.  Cardiovascular: Normal rate, regular rhythm and normal heart sounds.   No murmur heard. Pulmonary/Chest: Effort normal and breath sounds normal. No respiratory distress. She has no wheezes. She has no rales. She exhibits no tenderness, no bony tenderness and no crepitus.  No chest wall tenderness throughout costosternal junction  Musculoskeletal: Normal range of motion.  Lymphadenopathy:    She has no cervical adenopathy.  Neurological: She is alert and oriented to person, place, and time.  Skin: Skin is warm and dry.  Psychiatric: She has a normal mood and affect. Her behavior is normal.  Nursing note and vitals reviewed.  Filed Vitals:   08/07/15 1047  BP: 118/78  Pulse: 98  Temp: 98.7 F (37.1 C)  TempSrc: Oral  Resp: 16  Height: 5\' 5"  (1.651 m)  Weight: 163 lb 6 oz (74.106 kg)  SpO2: 95%    EKG: NSR, no acute ischemic change Assessment & Plan:   1. Chest tightness or pressure - suspect precordial due to cough  2. Acute bronchitis, unspecified organism   3. Allergic rhinitis, unspecified allergic rhinitis type     Orders Placed This Encounter  Procedures  . EKG 12-Lead    Meds ordered this encounter  Medications  . azithromycin (ZITHROMAX) 250 MG tablet    Sig: Take 2 tabs PO x 1 dose, then 1 tab PO QD x 4 days    Dispense:  6 tablet    Refill:  0  . benzonatate (TESSALON) 100 MG capsule    Sig: Take 1-2 capsules (100-200 mg  total) by mouth 3 (three) times daily as needed for cough.    Dispense:  60 capsule    Refill:  0  . Dextromethorphan-Guaifenesin (MUCINEX DM MAXIMUM STRENGTH) 60-1200 MG TB12    Sig: Take 1 tablet by mouth every 12 (twelve) hours.    Dispense:  14 each    Refill:  1    I personally performed the services described in this documentation, which was scribed in my presence. The recorded information has been reviewed and considered, and addended by me as needed.  Delman Cheadle, MD MPH

## 2015-08-24 ENCOUNTER — Ambulatory Visit (INDEPENDENT_AMBULATORY_CARE_PROVIDER_SITE_OTHER): Payer: Federal, State, Local not specified - PPO | Admitting: Internal Medicine

## 2015-08-24 ENCOUNTER — Encounter: Payer: Self-pay | Admitting: Internal Medicine

## 2015-08-24 VITALS — BP 122/70 | HR 78 | Ht 65.0 in | Wt 164.2 lb

## 2015-08-24 DIAGNOSIS — K219 Gastro-esophageal reflux disease without esophagitis: Secondary | ICD-10-CM | POA: Diagnosis not present

## 2015-08-24 DIAGNOSIS — R1013 Epigastric pain: Secondary | ICD-10-CM

## 2015-08-24 DIAGNOSIS — G8929 Other chronic pain: Secondary | ICD-10-CM | POA: Diagnosis not present

## 2015-08-24 MED ORDER — OMEPRAZOLE-SODIUM BICARBONATE 40-1100 MG PO CAPS: 1.0000 | ORAL_CAPSULE | Freq: Every day | ORAL | Status: DC

## 2015-08-24 NOTE — Progress Notes (Signed)
  Referred by Dr. Janeann Forehand Subjective:    Patient ID: Michele Lowery, female    DOB: 1955-11-11, 60 y.o.   MRN: [id number] Cc: reflux " I am very burpy" HPI Here at request of Dr. Carlota Raspberry - has been having nocturnal heartburn problems and occasional episodes during day - on omeprazole at bedtime and occasional prn carafate. HOB flat she does wait 3-4 hrs from meal to bedtime. Still has a bulge in upper abdomen that comes and goes - may occur with bending or during yoga  Medications, allergies, past medical history, past surgical history, family history and social history are reviewed and updated in the EMR.  Review of Systems Thinks she is too heavy, all other ROS negative    Objective:   Physical Exam BP 122/70 mmHg  Pulse 78  Ht 5\' 5"  (1.651 m)  Wt 164 lb 4 oz (74.503 kg)  BMI 27.33 kg/m2 @BP  122/70 mmHg  Pulse 78  Ht 5\' 5"  (1.651 m)  Wt 164 lb 4 oz (74.503 kg)  BMI 27.33 kg/m2@ General:  NAD Eyes:   anicteric Lungs:  clear Heart:: S1S2 no rubs, murmurs or gallops Abdomen:  soft and nontender, BS+, no obvious hernia in abdomen Ext:   no edema, cyanosis or clubbing Psych:  Appropriate mood and affect Data Reviewed:  Prior GI notes  [facility] notes 2012 Dr. Dalbert Batman and CT abd/pelvis 2012 - no hernia    Assessment & Plan:   Encounter Diagnoses  Name Primary?  . Gastroesophageal reflux disease, esophagitis presence not specified Yes  . Abdominal pain, chronic, epigastric - intermittent     Will try Zegerid qhs instead of omeprazole - it is immediate release and may work better Lifestyle changes - consider raising HOB Contact me in about 1 month to see how she is No definite indications to do egd (no weight loss, bleed/anemia, dysphagia)  No signs of hernia - not sure what her sxs are here - see surgery if needed - she may be having musculoskeletal abdominal wall pain from her exercise and yoga and has modified that some to reduce sxs  I appreciate the opportunity to care  for this patient. [id number] R, MD

## 2015-08-24 NOTE — Patient Instructions (Addendum)
We have sent the following medications to your pharmacy for you to pick up at your convenience: Zegerid, use this instead of your omeprazole  Please give [place] an update in a month.    I appreciate the opportunity to care for you.    Marland Kitchen

## 2015-09-15 ENCOUNTER — Other Ambulatory Visit: Payer: Self-pay | Admitting: Family Medicine

## 2015-11-30 ENCOUNTER — Telehealth: Payer: Self-pay | Admitting: Internal Medicine

## 2015-11-30 NOTE — Telephone Encounter (Signed)
Generic Zegerid was approved through 10/01/15-11/29/16. Patient notified of approval. Will forward message to Dr. Carlean Purl to let him know that this medication is working well for patient.

## 2015-11-30 NOTE — Telephone Encounter (Signed)
Patient states she was told by the pharmacy that she needs a PA for Zegerid. Patient called insurance company and they gave her a number for her doctor's office to call. # [phone]. Informed patient that I will call her insurance company and get back to her with a response. Also patient states she was supposed to call back with an update and let Dr. Carlean Purl know if this medication was working better. She states the medication has been so much better than the omeprazole.

## 2015-12-19 ENCOUNTER — Other Ambulatory Visit: Payer: Self-pay | Admitting: Family Medicine

## 2015-12-19 DIAGNOSIS — I10 Essential (primary) hypertension: Secondary | ICD-10-CM

## 2016-03-09 ENCOUNTER — Other Ambulatory Visit: Payer: Self-pay | Admitting: Family Medicine

## 2016-03-09 DIAGNOSIS — F32A Depression, unspecified: Secondary | ICD-10-CM

## 2016-03-09 DIAGNOSIS — F329 Major depressive disorder, single episode, unspecified: Secondary | ICD-10-CM

## 2016-03-17 ENCOUNTER — Other Ambulatory Visit: Payer: Self-pay | Admitting: Family Medicine

## 2016-03-17 DIAGNOSIS — E785 Hyperlipidemia, unspecified: Secondary | ICD-10-CM

## 2016-03-26 ENCOUNTER — Other Ambulatory Visit: Payer: Self-pay | Admitting: Family Medicine

## 2016-03-26 DIAGNOSIS — I10 Essential (primary) hypertension: Secondary | ICD-10-CM

## 2016-06-21 ENCOUNTER — Other Ambulatory Visit: Payer: Self-pay | Admitting: Family Medicine

## 2016-06-21 DIAGNOSIS — E785 Hyperlipidemia, unspecified: Secondary | ICD-10-CM

## 2016-06-21 NOTE — Telephone Encounter (Signed)
Patient notified via My Chart. Need office visit and labs for more  Meds ordered this encounter  Medications  . atorvastatin (LIPITOR) 40 MG tablet    Sig: TAKE 1 TABLET BY MOUTH DAILY    Dispense:  90 tablet    Refill:  0

## 2016-09-07 ENCOUNTER — Other Ambulatory Visit: Payer: Self-pay | Admitting: Internal Medicine

## 2016-09-24 ENCOUNTER — Other Ambulatory Visit: Payer: Self-pay | Admitting: Physician Assistant

## 2016-09-24 DIAGNOSIS — E785 Hyperlipidemia, unspecified: Secondary | ICD-10-CM

## 2016-10-15 ENCOUNTER — Other Ambulatory Visit: Payer: Self-pay | Admitting: Family Medicine

## 2016-10-15 DIAGNOSIS — F329 Major depressive disorder, single episode, unspecified: Secondary | ICD-10-CM

## 2016-10-15 DIAGNOSIS — F32A Depression, unspecified: Secondary | ICD-10-CM

## 2016-10-20 ENCOUNTER — Ambulatory Visit (INDEPENDENT_AMBULATORY_CARE_PROVIDER_SITE_OTHER): Payer: Federal, State, Local not specified - PPO | Admitting: Physician Assistant

## 2016-10-20 ENCOUNTER — Encounter: Payer: Self-pay | Admitting: Physician Assistant

## 2016-10-20 VITALS — BP 140/80 | HR 67 | Temp 98.1°F | Resp 18 | Ht 65.0 in | Wt 171.0 lb

## 2016-10-20 DIAGNOSIS — F32A Depression, unspecified: Secondary | ICD-10-CM

## 2016-10-20 DIAGNOSIS — R030 Elevated blood-pressure reading, without diagnosis of hypertension: Secondary | ICD-10-CM

## 2016-10-20 DIAGNOSIS — I10 Essential (primary) hypertension: Secondary | ICD-10-CM | POA: Diagnosis not present

## 2016-10-20 DIAGNOSIS — F329 Major depressive disorder, single episode, unspecified: Secondary | ICD-10-CM | POA: Diagnosis not present

## 2016-10-20 DIAGNOSIS — Z79899 Other long term (current) drug therapy: Secondary | ICD-10-CM

## 2016-10-20 DIAGNOSIS — E785 Hyperlipidemia, unspecified: Secondary | ICD-10-CM

## 2016-10-20 LAB — CMP14+EGFR
ALT: 22 IU/L (ref 0–32)
AST: 21 IU/L (ref 0–40)
Albumin/Globulin Ratio: 2.7 — ABNORMAL HIGH (ref 1.2–2.2)
Albumin: 4.9 g/dL — ABNORMAL HIGH (ref 3.6–4.8)
Alkaline Phosphatase: 86 IU/L (ref 39–117)
BUN/Creatinine Ratio: 15 (ref 12–28)
BUN: 12 mg/dL (ref 8–27)
Bilirubin Total: 0.5 mg/dL (ref 0.0–1.2)
CO2: 25 mmol/L (ref 20–29)
Calcium: 9.9 mg/dL (ref 8.7–10.3)
Chloride: 100 mmol/L (ref 96–106)
Creatinine, Ser: 0.8 mg/dL (ref 0.57–1.00)
GFR calc Af Amer: 91 mL/min/{1.73_m2} (ref >59)
GFR calc non Af Amer: 79 mL/min/{1.73_m2} (ref >59)
Globulin, Total: 1.8 g/dL (ref 1.5–4.5)
Glucose: 93 mg/dL (ref 65–99)
Potassium: 4.1 mmol/L (ref 3.5–5.2)
Sodium: 143 mmol/L (ref 134–144)
Total Protein: 6.7 g/dL (ref 6.0–8.5)

## 2016-10-20 LAB — LIPID PANEL
Chol/HDL Ratio: 3.7 ratio (ref 0.0–4.4)
Cholesterol, Total: 213 mg/dL — ABNORMAL HIGH (ref 100–199)
HDL: 58 mg/dL (ref >39)
LDL Calculated: 115 mg/dL — ABNORMAL HIGH (ref 0–99)
Triglycerides: 200 mg/dL — ABNORMAL HIGH (ref 0–149)
VLDL Cholesterol Cal: 40 mg/dL (ref 5–40)

## 2016-10-20 MED ORDER — FLUOXETINE HCL 20 MG PO TABS: 20.0000 mg | ORAL_TABLET | Freq: Every day | ORAL | 4 refills | Status: DC

## 2016-10-20 MED ORDER — LOSARTAN POTASSIUM-HCTZ 100-12.5 MG PO TABS: 1.0000 | ORAL_TABLET | Freq: Every day | ORAL | 4 refills | Status: DC

## 2016-10-20 MED ORDER — ATORVASTATIN CALCIUM 40 MG PO TABS: 40.0000 mg | ORAL_TABLET | Freq: Every day | ORAL | 4 refills | Status: DC

## 2016-10-20 NOTE — Patient Instructions (Addendum)
Please come back in 1 year for recheck.  Thank you for coming in today. I hope you feel we met your needs.  Feel free to call [facility] if you have any questions or further requests.  Please consider signing up for MyChart if you do not already have it, as this is a great way to communicate with me.  Best,  Whitney McVey, PA-C   We recommend that you schedule a mammogram for breast cancer screening. Typically, you do not need a referral to do this. Please contact a local imaging center to schedule your mammogram.  [facility] - [phone]  *ask for the Radiology Department [facility]) - [phone] or [phone]  [facility] - [phone] [place] [phone] [facility] - [phone]  *ask for the [facility] - [phone]  *ask for the Radiology Department [facility] - [phone]  *ask for the [place] - [phone]    IF you received an x-ray today, you will receive an invoice from [facility]. Please contact [facility] at [phone] with questions or concerns regarding your invoice.   IF you received labwork today, you will receive an invoice from [facility]. Please contact [facility] at [phone] with questions or concerns regarding your invoice.   Our billing staff will not be able to assist you with questions regarding bills from these companies.  You will be contacted with the lab results as soon as they are available. The fastest way to get your results is to activate your My Chart account. Instructions are located on the last page of this paperwork. If you have not heard from [place] regarding the results in 2 weeks, please contact this office.

## 2016-10-20 NOTE — Progress Notes (Signed)
Michele Lowery  MRN: [id number] DOB: 21-Jan-1956  PCP: Wendie Agreste, MD  Subjective:  Pt is a 61 year old female PMH HTN, HLD, depression who presents to clinic for medication refill.   HLD - Atorvistatin 61m qd. Reports compliance. No side effects. Elevated lipids date back > 10 years. She is interested in getting off the medication.  She is fasting today.   Depression - Fluoxetine 280mx several years. Decreased dose from 4033mn 2016. Feeling well today. Denies SI or HI.   HTN - Hyzaar 100-12.5mg33moday's blood pressure is 160/89. Recheck is 140/80.  She had an extensive cardiac work-up in Sept 2011 (negative Cardiolite stress test); 2-D ECHO w/ LVEF 55%.   Review of Systems  Constitutional: Negative for chills, diaphoresis, fatigue and fever.  Respiratory: Negative for cough, chest tightness, shortness of breath and wheezing.   Cardiovascular: Negative for chest pain and palpitations.  Gastrointestinal: Negative for abdominal pain, diarrhea, nausea and vomiting.  Neurological: Negative for weakness, light-headedness and headaches.  Psychiatric/Behavioral: Negative for dysphoric mood and self-injury.    Patient Active Problem List   Diagnosis Date Noted  . Stress incontinence 12/23/2014  . Peripheral neuropathy 12/17/2013  . Rosacea   . HLD (hyperlipidemia)   . HTN (hypertension)   . Depression   . ? Hernia of abdominal wall - epigastrium 10/17/2010    Class: Question of  . Heartburn 10/17/2010  . Overweight (BMI 25.0-29.9) 10/17/2010    Current Outpatient Prescriptions on File Prior to Visit  Medication Sig Dispense Refill  . aspirin 81 MG tablet Take 81 mg by mouth daily.      . atMarland Kitchenrvastatin (LIPITOR) 40 MG tablet TAKE 1 TABLET BY MOUTH DAILY 90 tablet 0  . b complex vitamins tablet Take 1 tablet by mouth daily.    . cholecalciferol (VITAMIN D) 400 UNITS TABS tablet Take 400 Units by mouth.    . FLMarland Kitchenoxetine (PROZAC) 20 MG tablet Take 1 tablet (20 mg total) by  mouth daily. 90 tablet 3  . losartan-hydrochlorothiazide (HYZAAR) 100-12.5 MG tablet TAKE 1 TABLET BY MOUTH DAILY. 90 tablet 0  . Omega-3 Fatty Acids (FISH OIL PO) Take 4 tablets by mouth daily.     . omMarland Kitchenprazole-sodium bicarbonate (ZEGERID) 40-1100 MG capsule TAKE 1 CAPSULE BY MOUTH AT BEDTIME. 90 capsule 1  . sucralfate (CARAFATE) 1 g tablet TAKE 1 TABLET (1 G TOTAL) BY MOUTH 2 (TWO) TIMES DAILY AS NEEDED. 90 tablet 0   No current facility-administered medications on file prior to visit.     Allergies  Allergen Reactions  . Amlodipine   . Desipramine   . Doxycycline Nausea And Vomiting    REACTION: nausea and vomiting  . Lisinopril Cough  . Sulfonamide Derivatives     REACTION: "i can't remember the reaction"     Objective:  BP (!) 160/89 (BP Location: Right Arm, Patient Position: Sitting, Cuff Size: Normal)   Pulse 67   Temp 98.1 F (36.7 C) (Oral)   Resp 18   Ht _0  (1.651 m)   Wt 171 lb (77.6 kg)   SpO2 96%   BMI 28.46 kg/m   Lab Results  Component Value Date   CHOL 183 06/23/2015   HDL 61 06/23/2015   LDLCALC 93 06/23/2015   TRIG 144 06/23/2015   CHOLHDL 3.0 06/23/2015    Physical Exam  Constitutional: She is oriented to person, place, and time and well-developed, well-nourished, and in no distress. No distress.  Cardiovascular: Normal rate,  regular rhythm and normal heart sounds.   Neurological: She is alert and oriented to person, place, and time. GCS score is 15.  Skin: Skin is warm and dry.  Psychiatric: Mood, memory, affect and judgment normal.  Vitals reviewed.   Assessment and Plan :  1. Encounter for medication management 2. Hyperlipidemia, unspecified hyperlipidemia type - Lipid panel - atorvastatin (LIPITOR) 40 MG tablet; Take 1 tablet (40 mg total) by mouth daily.  Dispense: 90 tablet; Refill: 4 - Last lipid wnl. Labs are pending. Will contact with results. She is interested in reducing the dose. Encouraged con't healthy diet and exercise. RTC  in 1 year for fasting labs.   3. Depression, unspecified depression type - FLUoxetine (PROZAC) 20 MG tablet; Take 1 tablet (20 mg total) by mouth daily.  Dispense: 90 tablet; Refill: 4 - Controlled. OK to refill   4. Essential hypertension 5. Elevated blood pressure reading - CMP14+EGFR - losartan-hydrochlorothiazide (HYZAAR) 100-12.5 MG tablet; Take 1 tablet by mouth daily.  Dispense: 90 tablet; Refill: 4 - Recheck vitals - Lab is pending. Will contact with results. Consider increasing Hyzaar at next appt if bp is elevated.    Mercer Pod, PA-C  [facility] 10/20/2016 9:53 AM

## 2016-10-23 NOTE — Progress Notes (Signed)
Asked pt to improve diet and increase exercise. Recheck in 3-6 months.

## 2016-10-23 NOTE — Addendum Note (Signed)
Addended by: Dorise Hiss on: 10/23/2016 02:16 PM   Modules accepted: Orders

## 2016-11-06 DIAGNOSIS — L820 Inflamed seborrheic keratosis: Secondary | ICD-10-CM | POA: Diagnosis not present

## 2016-11-06 DIAGNOSIS — D225 Melanocytic nevi of trunk: Secondary | ICD-10-CM | POA: Diagnosis not present

## 2016-11-06 DIAGNOSIS — L821 Other seborrheic keratosis: Secondary | ICD-10-CM | POA: Diagnosis not present

## 2016-11-06 DIAGNOSIS — L814 Other melanin hyperpigmentation: Secondary | ICD-10-CM | POA: Diagnosis not present

## 2017-01-18 ENCOUNTER — Encounter: Payer: Self-pay | Admitting: Family Medicine

## 2017-01-18 ENCOUNTER — Ambulatory Visit (INDEPENDENT_AMBULATORY_CARE_PROVIDER_SITE_OTHER): Payer: Federal, State, Local not specified - PPO | Admitting: Family Medicine

## 2017-01-18 DIAGNOSIS — M7661 Achilles tendinitis, right leg: Secondary | ICD-10-CM

## 2017-01-18 MED ORDER — NITROGLYCERIN 0.2 MG/HR TD PT24: MEDICATED_PATCH | TRANSDERMAL | 1 refills | Status: DC

## 2017-01-18 NOTE — Patient Instructions (Signed)
You have Achilles Tendinopathy Ibuprofen or aleve only if needed for pain. Calf raises 3 sets of 10 on level ground once a day first. When these are easy, can do them one legged 3 sets of 10. Finally advance to doing them on a step. Can add heel walks, toe walks forward and backward as well Can ice 3-4 times a day 15 minutes at a time. Avoid uneven ground, hills as much as possible. Heel lifts in shoes or shoes with a natural heel lift. Nitro patches 1/4th patch to affected achilles, change daily. Consider physical therapy, orthotics if not improving as expected. Follow up in 6 weeks.

## 2017-01-19 DIAGNOSIS — M7661 Achilles tendinitis, right leg: Secondary | ICD-10-CM | POA: Insufficient documentation

## 2017-01-19 NOTE — Progress Notes (Signed)
PCP: Wendie Agreste, MD  Subjective:   HPI: Patient is a 61 y.o. female here for right heel pain.  Patient reports she's had at least 4 months of posterior right heel pain. Pain level up to 5/10 and sharp, worse with walking and stairs. Recalls having problems with her achilles years ago when playing tennis - feels similar. She used nitro patches this time occasionally for about 3 weeks - maybe helped some (husband used them for his achilles). Getting a knot on back of heel. Hard to wear shoes that aren't open back. Pain radiates up to calf. No skin changes, numbness.  Past Medical History:  Diagnosis Date  . Anxiety   . Depression   . HLD (hyperlipidemia)   . HTN (hypertension)   . Hx: UTI (urinary tract infection)   . Internal hemorrhoids 11/13/08  . Migraine headache 05/07/2011   h/o atypical migraines  . Rosacea   . Vertigo     Current Outpatient Prescriptions on File Prior to Visit  Medication Sig Dispense Refill  . aspirin 81 MG tablet Take 81 mg by mouth daily.      Marland Kitchen atorvastatin (LIPITOR) 40 MG tablet Take 1 tablet (40 mg total) by mouth daily. 90 tablet 4  . b complex vitamins tablet Take 1 tablet by mouth daily.    . cholecalciferol (VITAMIN D) 400 UNITS TABS tablet Take 400 Units by mouth.    Marland Kitchen FLUoxetine (PROZAC) 20 MG tablet Take 1 tablet (20 mg total) by mouth daily. 90 tablet 4  . losartan-hydrochlorothiazide (HYZAAR) 100-12.5 MG tablet Take 1 tablet by mouth daily. 90 tablet 4  . Omega-3 Fatty Acids (FISH OIL PO) Take 4 tablets by mouth daily.     Marland Kitchen omeprazole-sodium bicarbonate (ZEGERID) 40-1100 MG capsule TAKE 1 CAPSULE BY MOUTH AT BEDTIME. 90 capsule 1  . sucralfate (CARAFATE) 1 g tablet TAKE 1 TABLET (1 G TOTAL) BY MOUTH 2 (TWO) TIMES DAILY AS NEEDED. 90 tablet 0   No current facility-administered medications on file prior to visit.     Past Surgical History:  Procedure Laterality Date  . COLONOSCOPY  11/13/2008   internal hemrrhoids    Allergies   Allergen Reactions  . Amlodipine   . Desipramine   . Doxycycline Nausea And Vomiting    REACTION: nausea and vomiting  . Lisinopril Cough  . Sulfonamide Derivatives     REACTION: "i can't remember the reaction"    Social History   Social History  . Marital status: Married    Spouse name: N/A  . Number of children: 2  . Years of education: N/A   Occupational History  . Control and instrumentation engineer    Social History Main Topics  . Smoking status: Former Smoker    Packs/day: 0.50    Years: 4.00    Types: Cigarettes    Quit date: 07/09/1976  . Smokeless tobacco: Never Used  . Alcohol use 0.5 oz/week    1 Standard drinks or equivalent per week     Comment: 1-2 per month   . Drug use: No  . Sexual activity: No   Other Topics Concern  . Not on file   Social History Narrative   1 caffeine drink daily     Family History  Problem Relation Age of Onset  . COPD Mother   . Heart disease Father     BP (!) 157/81   Pulse 67   Ht 5\' 6"  (1.676 m)   Wt 165 lb (74.8 kg)  BMI 26.63 kg/m   Review of Systems: See HPI above.     Objective:  Physical Exam:  Gen: NAD, comfortable in exam room  Right foot/ankle: Small haglunds deformity.  No other gross deformity, swelling, ecchymoses FROM.  Difficulty with single calf raise - some pain with double calf raise TTP achilles at insertion on calcaneus. Negative ant drawer and talar tilt.   Negative syndesmotic compression. Negative calcaneal squeeze. Thompsons test negative. NV intact distally.  Left foot/ankle: FROM without pain.   Assessment & Plan:  1. Right achilles tendinitis - shown home exercises to do daily.  Ibuprofen or aleve only if needed.  Heel lifts.  Avoid uneven ground and hills if possible.  Nitro patches - discussed risks of headache and skin irritation - she tried for about 3 weeks and no side effects.  Consider PT, orthotics if not improving.  F/u in 6 weeks.

## 2017-01-19 NOTE — Assessment & Plan Note (Signed)
shown home exercises to do daily.  Ibuprofen or aleve only if needed.  Heel lifts.  Avoid uneven ground and hills if possible.  Nitro patches - discussed risks of headache and skin irritation - she tried for about 3 weeks and no side effects.  Consider PT, orthotics if not improving.  F/u in 6 weeks.

## 2017-01-27 ENCOUNTER — Encounter: Payer: Self-pay | Admitting: Family Medicine

## 2017-01-27 ENCOUNTER — Ambulatory Visit (INDEPENDENT_AMBULATORY_CARE_PROVIDER_SITE_OTHER): Payer: Federal, State, Local not specified - PPO | Admitting: Family Medicine

## 2017-01-27 VITALS — BP 122/70 | HR 79 | Temp 98.2°F | Resp 18 | Ht 66.0 in | Wt 170.0 lb

## 2017-01-27 DIAGNOSIS — J069 Acute upper respiratory infection, unspecified: Secondary | ICD-10-CM

## 2017-01-27 DIAGNOSIS — Z23 Encounter for immunization: Secondary | ICD-10-CM

## 2017-01-27 MED ORDER — GUAIFENESIN ER 600 MG PO TB12: 600.0000 mg | ORAL_TABLET | Freq: Two times a day (BID) | ORAL | 0 refills | Status: DC | PRN

## 2017-01-27 MED ORDER — FLUTICASONE PROPIONATE 50 MCG/ACT NA SUSP: 1.0000 | Freq: Every day | NASAL | 2 refills | Status: DC

## 2017-01-27 NOTE — Patient Instructions (Signed)
     IF you received an x-ray today, you will receive an invoice from [facility]. Please contact [facility] at [phone] with questions or concerns regarding your invoice.   IF you received labwork today, you will receive an invoice from [facility]. Please contact [facility] at [phone] with questions or concerns regarding your invoice.   Our billing staff will not be able to assist you with questions regarding bills from these companies.  You will be contacted with the lab results as soon as they are available. The fastest way to get your results is to activate your My Chart account. Instructions are located on the last page of this paperwork. If you have not heard from us regarding the results in 2 weeks, please contact this office.     

## 2017-01-27 NOTE — Progress Notes (Signed)
Chief Complaint  Patient presents with  . Nasal Congestion    congestion x 1 month, cough, chest hurts due to congestion moving into chest, st, ha, fatigue , mucus drainage from nasal passages or chest.  Taking advil for symptoms but none today wanted to come in with symptoms, Did dtry mucinex but stopped.    HPI  Upper Respiratory Infection:  Onset - 1 month She has nasal congestion and non productive cough that has now moved to her chest and has some chest congestion with sore throat, headache, fever and fatigue.  She has some mucus draining from her nasal passages  Alleviated by tylenol a little She did not take any tylenol today She is a nonsmoker No history of asthma She is interested in getting a flu shot    Past Medical History:  Diagnosis Date  . Anxiety   . Depression   . HLD (hyperlipidemia)   . HTN (hypertension)   . Hx: UTI (urinary tract infection)   . Internal hemorrhoids 11/13/08  . Migraine headache 05/07/2011   h/o atypical migraines  . Rosacea   . Vertigo     Current Outpatient Prescriptions  Medication Sig Dispense Refill  . aspirin 81 MG tablet Take 81 mg by mouth daily.      Marland Kitchen atorvastatin (LIPITOR) 40 MG tablet Take 1 tablet (40 mg total) by mouth daily. 90 tablet 4  . b complex vitamins tablet Take 1 tablet by mouth daily.    . cholecalciferol (VITAMIN D) 400 UNITS TABS tablet Take 400 Units by mouth.    Marland Kitchen FLUoxetine (PROZAC) 20 MG tablet Take 1 tablet (20 mg total) by mouth daily. 90 tablet 4  . losartan-hydrochlorothiazide (HYZAAR) 100-12.5 MG tablet Take 1 tablet by mouth daily. 90 tablet 4  . nitroGLYCERIN (NITRODUR - DOSED IN MG/24 HR) 0.2 mg/hr patch Apply 1/4th patch to affected achilles, change daily 30 patch 1  . Omega-3 Fatty Acids (FISH OIL PO) Take 4 tablets by mouth daily.     Marland Kitchen omeprazole-sodium bicarbonate (ZEGERID) 40-1100 MG capsule TAKE 1 CAPSULE BY MOUTH AT BEDTIME. 90 capsule 1  . sucralfate (CARAFATE) 1 g tablet TAKE 1 TABLET (1 G  TOTAL) BY MOUTH 2 (TWO) TIMES DAILY AS NEEDED. 90 tablet 0  . Urea 39 % CREA APPLY TO AFFECTED AREA EVERY DAY  2  . fluticasone (FLONASE) 50 MCG/ACT nasal spray Place 1 spray into both nostrils daily. 16 g 2  . guaiFENesin (MUCINEX) 600 MG 12 hr tablet Take 1 tablet (600 mg total) by mouth 2 (two) times daily as needed for to loosen phlegm. 30 tablet 0   No current facility-administered medications for this visit.     Allergies:  Allergies  Allergen Reactions  . Amlodipine   . Desipramine   . Doxycycline Nausea And Vomiting    REACTION: nausea and vomiting  . Lisinopril Cough  . Sulfonamide Derivatives     REACTION: "i can't remember the reaction"    Past Surgical History:  Procedure Laterality Date  . COLONOSCOPY  11/13/2008   internal hemrrhoids    Social History   Social History  . Marital status: Married    Spouse name: N/A  . Number of children: 2  . Years of education: N/A   Occupational History  . Control and instrumentation engineer    Social History Main Topics  . Smoking status: Former Smoker    Packs/day: 0.50    Years: 4.00    Types: Cigarettes    Quit date:  07/09/1976  . Smokeless tobacco: Never Used  . Alcohol use 0.5 oz/week    1 Standard drinks or equivalent per week     Comment: 1-2 per month   . Drug use: No  . Sexual activity: No   Other Topics Concern  . None   Social History Narrative   1 caffeine drink daily     Family History  Problem Relation Age of Onset  . COPD Mother   . Heart disease Father      Review of Systems  HENT: Positive for congestion.        She describes chest congestion  Respiratory: Positive for cough. Negative for sputum production, shortness of breath and wheezing.   Cardiovascular: Negative for chest pain, palpitations and leg swelling.  Gastrointestinal: Positive for heartburn. Negative for abdominal pain, nausea and vomiting.       She reports that she has a history of reflux that was aggravated overnight  Neurological:  Positive for headaches. Negative for dizziness.       Frontal headaches    Objective: Vitals:   01/27/17 1407  BP: 122/70  Pulse: 79  Resp: 18  Temp: 98.2 F (36.8 C)  TempSrc: Oral  SpO2: 95%  Weight: 170 lb (77.1 kg)  Height: 5\' 6"  (1.676 m)    Physical Exam General: alert, oriented, in NAD Head: normocephalic, atraumatic, no sinus tenderness Eyes: EOM intact, no scleral icterus or conjunctival injection Ears: TM clear bilaterally Nose: mucosa nonerythematous, nonedematous Throat: no pharyngeal exudate or erythema Lymph: no posterior auricular, submental or cervical lymph adenopathy Heart: normal rate, normal sinus rhythm, no murmurs Lungs: clear to auscultation bilaterally, no wheezing   Assessment and Plan Maysoon was seen today for nasal congestion.  Diagnoses and all orders for this visit:  Acute URI-  Supportive care, no need for abx -     Flu Vaccine QUAD 36+ mos IM  Need for prophylactic vaccination and inoculation against influenza  Other orders -     fluticasone (FLONASE) 50 MCG/ACT nasal spray; Place 1 spray into both nostrils daily. -     guaiFENesin (MUCINEX) 600 MG 12 hr tablet; Take 1 tablet (600 mg total) by mouth 2 (two) times daily as needed for to loosen phlegm.     [place]

## 2017-03-08 ENCOUNTER — Encounter: Payer: Self-pay | Admitting: Family Medicine

## 2017-03-08 ENCOUNTER — Ambulatory Visit: Payer: Federal, State, Local not specified - PPO | Admitting: Family Medicine

## 2017-03-08 DIAGNOSIS — M7661 Achilles tendinitis, right leg: Secondary | ICD-10-CM | POA: Diagnosis not present

## 2017-03-08 NOTE — Patient Instructions (Signed)
You have Achilles Tendinopathy Ibuprofen or aleve only if needed for pain. Continue calf raise exercises until pain has resolved. Icing 15 minutes at a time as needed. Avoid uneven ground, hills as much as possible. Heel lifts in shoes or shoes with a natural heel lift. Nitro patches 1/2 patch to affected achilles, change daily. Consider physical therapy, orthotics if not improving as expected. Follow up with me in 6 weeks or as needed if you continue to improve.

## 2017-03-08 NOTE — Progress Notes (Signed)
PCP: Wendie Agreste, MD  Subjective:   HPI: Patient is a 61 y.o. female here for right heel pain.  10/11: Patient reports she's had at least 4 months of posterior right heel pain. Pain level up to 5/10 and sharp, worse with walking and stairs. Recalls having problems with her achilles years ago when playing tennis - feels similar. She used nitro patches this time occasionally for about 3 weeks - maybe helped some (husband used them for his achilles). Getting a knot on back of heel. Hard to wear shoes that aren't open back. Pain radiates up to calf. No skin changes, numbness.  11/29: Patient reports she's doing better. Still with some soreness posterior right heel where achilles comes in. She is doing home exercises, icing. Pain primarily with pushing on the area, a lot of movement. Using 1/2 patch nitroglycerin. Sometimes throbbing pain but currently 0/10. No skin changes, numbness.  Past Medical History:  Diagnosis Date  . Anxiety   . Depression   . HLD (hyperlipidemia)   . HTN (hypertension)   . Hx: UTI (urinary tract infection)   . Internal hemorrhoids 11/13/08  . Migraine headache 05/07/2011   h/o atypical migraines  . Rosacea   . Vertigo     Current Outpatient Medications on File Prior to Visit  Medication Sig Dispense Refill  . aspirin 81 MG tablet Take 81 mg by mouth daily.      Marland Kitchen atorvastatin (LIPITOR) 40 MG tablet Take 1 tablet (40 mg total) by mouth daily. 90 tablet 4  . b complex vitamins tablet Take 1 tablet by mouth daily.    . cholecalciferol (VITAMIN D) 400 UNITS TABS tablet Take 400 Units by mouth.    Marland Kitchen FLUoxetine (PROZAC) 20 MG tablet Take 1 tablet (20 mg total) by mouth daily. 90 tablet 4  . fluticasone (FLONASE) 50 MCG/ACT nasal spray Place 1 spray into both nostrils daily. 16 g 2  . guaiFENesin (MUCINEX) 600 MG 12 hr tablet Take 1 tablet (600 mg total) by mouth 2 (two) times daily as needed for to loosen phlegm. 30 tablet 0  .  losartan-hydrochlorothiazide (HYZAAR) 100-12.5 MG tablet Take 1 tablet by mouth daily. 90 tablet 4  . nitroGLYCERIN (NITRODUR - DOSED IN MG/24 HR) 0.2 mg/hr patch Apply 1/4th patch to affected achilles, change daily 30 patch 1  . Omega-3 Fatty Acids (FISH OIL PO) Take 4 tablets by mouth daily.     Marland Kitchen omeprazole-sodium bicarbonate (ZEGERID) 40-1100 MG capsule TAKE 1 CAPSULE BY MOUTH AT BEDTIME. 90 capsule 1  . sucralfate (CARAFATE) 1 g tablet TAKE 1 TABLET (1 G TOTAL) BY MOUTH 2 (TWO) TIMES DAILY AS NEEDED. 90 tablet 0  . Urea 39 % CREA APPLY TO AFFECTED AREA EVERY DAY  2   No current facility-administered medications on file prior to visit.     Past Surgical History:  Procedure Laterality Date  . COLONOSCOPY  11/13/2008   internal hemrrhoids    Allergies  Allergen Reactions  . Amlodipine   . Desipramine   . Doxycycline Nausea And Vomiting    REACTION: nausea and vomiting  . Lisinopril Cough  . Sulfonamide Derivatives     REACTION: "i can't remember the reaction"    Social History   Socioeconomic History  . Marital status: Married    Spouse name: Not on file  . Number of children: 2  . Years of education: Not on file  . Highest education level: Not on file  Social Needs  . Financial  resource strain: Not on file  . Food insecurity - worry: Not on file  . Food insecurity - inability: Not on file  . Transportation needs - medical: Not on file  . Transportation needs - non-medical: Not on file  Occupational History  . Occupation: Control and instrumentation engineer  Tobacco Use  . Smoking status: Former Smoker    Packs/day: 0.50    Years: 4.00    Pack years: 2.00    Types: Cigarettes    Last attempt to quit: 07/09/1976    Years since quitting: 40.6  . Smokeless tobacco: Never Used  Substance and Sexual Activity  . Alcohol use: Yes    Alcohol/week: 0.5 oz    Types: 1 Standard drinks or equivalent per week    Comment: 1-2 per month   . Drug use: No  . Sexual activity: No    Birth  control/protection: Abstinence  Other Topics Concern  . Not on file  Social History Narrative   1 caffeine drink daily     Family History  Problem Relation Age of Onset  . COPD Mother   . Heart disease Father     BP 136/81   Pulse 65   Ht 5\' 7"  (1.702 m)   Wt 165 lb (74.8 kg)   BMI 25.84 kg/m   Review of Systems: See HPI above.     Objective:  Physical Exam:  Gen: NAD, comfortable in exam room.  Right foot/ankle: Small haglunds.  No other gross deformity, swelling, ecchymoses FROM with 5/5 strength. TTP at insertion of achilles on calcaneus laterally, mild. Negative calcaneal squeeze. Negative ant drawer and talar tilt.   Negative syndesmotic compression. Thompsons test negative. NV intact distally.   Assessment & Plan:  1. Right achilles tendinitis - improving.  Continue home exercises, nitro patches.  Ibuprofen or aleve if needed.  Icing, avoid flat shoes and barefoot walking.  Consider PT, orthotics if not continuing to improve.  F/u in 6 weeks or prn.

## 2017-03-08 NOTE — Assessment & Plan Note (Signed)
improving.  Continue home exercises, nitro patches.  Ibuprofen or aleve if needed.  Icing, avoid flat shoes and barefoot walking.  Consider PT, orthotics if not continuing to improve.  F/u in 6 weeks or prn.

## 2017-06-25 ENCOUNTER — Other Ambulatory Visit: Payer: Self-pay | Admitting: Family Medicine

## 2017-07-16 ENCOUNTER — Encounter: Payer: Self-pay | Admitting: Family Medicine

## 2017-07-16 ENCOUNTER — Other Ambulatory Visit: Payer: Self-pay

## 2017-07-16 ENCOUNTER — Ambulatory Visit: Payer: Federal, State, Local not specified - PPO | Admitting: Family Medicine

## 2017-07-16 VITALS — BP 100/60 | HR 81 | Temp 98.0°F | Ht 67.0 in | Wt 166.2 lb

## 2017-07-16 DIAGNOSIS — R7989 Other specified abnormal findings of blood chemistry: Secondary | ICD-10-CM

## 2017-07-16 DIAGNOSIS — R197 Diarrhea, unspecified: Secondary | ICD-10-CM | POA: Diagnosis not present

## 2017-07-16 DIAGNOSIS — I10 Essential (primary) hypertension: Secondary | ICD-10-CM

## 2017-07-16 LAB — POCT CBC
Granulocyte percent: 66.9 %G (ref 37–80)
HEMATOCRIT: 42.9 % (ref 37.7–47.9)
HEMOGLOBIN: 13.8 g/dL (ref 12.2–16.2)
Lymph, poc: 2.6 (ref 0.6–3.4)
MCH, POC: 27.8 pg (ref 27–31.2)
MCHC: 32.1 g/dL (ref 31.8–35.4)
MCV: 86.5 fL (ref 80–97)
MID (cbc): 0.4 (ref 0–0.9)
MPV: 8.1 fL (ref 0–99.8)
POC GRANULOCYTE: 6 (ref 2–6.9)
POC LYMPH %: 28.9 % (ref 10–50)
POC MID %: 4.2 %M (ref 0–12)
Platelet Count, POC: 404 10*3/uL (ref 142–424)
RBC: 4.96 M/uL (ref 4.04–5.48)
RDW, POC: 12.5 %
WBC: 8.9 10*3/uL (ref 4.6–10.2)

## 2017-07-16 MED ORDER — VANCOMYCIN HCL 125 MG PO CAPS: 125.0000 mg | ORAL_CAPSULE | Freq: Four times a day (QID) | ORAL | 0 refills | Status: DC

## 2017-07-16 NOTE — Patient Instructions (Addendum)
Blood counts look okay today, and other vital signs overall look okay.  Make sure you are drinking plenty of fluids. If you have persistent diarrhea today, you may need to hold your blood pressure medication temporarily.  As symptoms have improved last night into today, this still may be a virus.  Ok to use Immodium few times today if needed. I will check for other infectious causes of diarrhea on the stool test.  If you have more than 6 unformed stools today, start vancomycin that was printed.   Return to the clinic or go to the nearest emergency room if any of your symptoms worsen or new symptoms occur.   Diarrhea, Adult Diarrhea is frequent loose and watery bowel movements. Diarrhea can make you feel weak and cause you to become dehydrated. Dehydration can make you tired and thirsty, cause you to have a dry mouth, and decrease how often you urinate. Diarrhea typically lasts 2-3 days. However, it can last longer if it is a sign of something more serious. It is important to treat your diarrhea as told by your health care provider. Follow these instructions at home: Eating and drinking  Follow these recommendations as told by your health care provider:  Take an oral rehydration solution (ORS). This is a drink that is sold at pharmacies and retail stores.  Drink clear fluids, such as water, ice chips, diluted fruit juice, and low-calorie sports drinks.  Eat bland, easy-to-digest foods in small amounts as you are able. These foods include bananas, applesauce, rice, lean meats, toast, and crackers.  Avoid drinking fluids that contain a lot of sugar or caffeine, such as energy drinks, sports drinks, and soda.  Avoid alcohol.  Avoid spicy or fatty foods.  General instructions  Drink enough fluid to keep your urine clear or pale yellow.  Wash your hands often. If soap and water are not available, use hand sanitizer.  Make sure that all people in your household wash their hands well and  often.  Take over-the-counter and prescription medicines only as told by your health care provider.  Rest at home while you recover.  Watch your condition for any changes.  Take a warm bath to relieve any burning or pain from frequent diarrhea episodes.  Keep all follow-up visits as told by your health care provider. This is important. Contact a health care provider if:  You have a fever.  Your diarrhea gets worse.  You have new symptoms.  You cannot keep fluids down.  You feel light-headed or dizzy.  You have a headache  You have muscle cramps. Get help right away if:  You have chest pain.  You feel extremely weak or you faint.  You have bloody or black stools or stools that look like tar.  You have severe pain, cramping, or bloating in your abdomen.  You have trouble breathing or you are breathing very quickly.  Your heart is beating very quickly.  Your skin feels cold and clammy.  You feel confused.  You have signs of dehydration, such as: ? Dark urine, very little urine, or no urine. ? Cracked lips. ? Dry mouth. ? Sunken eyes. ? Sleepiness. ? Weakness. This information is not intended to replace advice given to you by your health care provider. Make sure you discuss any questions you have with your health care provider. Document Released: 03/17/2002 Document Revised: 08/05/2015 Document Reviewed: 12/01/2014 Elsevier Interactive Patient Education  2018 [employer].   IF you received an x-ray today, you will  receive an Pharmacologist from [facility]. Please contact [facility] at [phone] with questions or concerns regarding your invoice.   IF you received labwork today, you will receive an invoice from [place]. Please contact [facility] at [phone] with questions or concerns regarding your invoice.   Our billing staff will not be able to assist you with questions regarding bills from these companies.  You will be contacted with the  lab results as soon as they are available. The fastest way to get your results is to activate your My Chart account. Instructions are located on the last page of this paperwork. If you have not heard from [place] regarding the results in 2 weeks, please contact this office.

## 2017-07-16 NOTE — Progress Notes (Addendum)
Subjective:  By signing my name below, I, Essence Howell, attest that this documentation has been prepared under the direction and in the presence of Wendie Agreste, MD Electronically Signed: Ladene Artist, ED Scribe 07/16/2017 at 1:44 PM.   Patient ID: Michele Lowery, female    DOB: 11-30-1955, 62 y.o.   MRN: [id number]  Chief Complaint  Patient presents with  . Diarrhea    runny watery, since Wednesday. Not been eatting much.   . Fever    off and on Thursday (100 degrees)   HPI Michele Lowery is a 62 y.o. female who presents to Primary Care at [facility] complaining of abdominal cramping and watery diarrhea for the past 5 days. Pt reports up to 10 episodes of diarrhea a day that occurs after eating and drinking, 2 episodes today even after eating a small amount or rice, no diarrhea last night. She reports intermittent fever with Tmax of 100 F over the past few days, resolved yesterday, belching, decreased appetite. She has taken TUMS for belching. Denies nausea, vomiting, blood in stools, difficulty urinating, lightheadedness, dizziness, recent hospitalizations, recent antibiotic use, no h/o c.diff. She does report visiting a friend who has been in the hospital at least once/wk. Some coworkers have been ill with stomach bug symptoms with more nausea and vomiting. Last colonoscopy in 11/2008, normal colon at that time.  Pt reports that she did have cough with chest congestion several wks ago. Tried to make an appointment but none were available. Denies cp or chest tightness at this time.  Patient Active Problem List   Diagnosis Date Noted  . Right Achilles tendinitis 01/19/2017  . Stress incontinence 12/23/2014  . Peripheral neuropathy 12/17/2013  . Rosacea   . HLD (hyperlipidemia)   . HTN (hypertension)   . Depression   . ? Hernia of abdominal wall - epigastrium 10/17/2010    Class: Question of  . Heartburn 10/17/2010  . Overweight (BMI 25.0-29.9) 10/17/2010   Past Medical History:    Diagnosis Date  . Anxiety   . Depression   . HLD (hyperlipidemia)   . HTN (hypertension)   . Hx: UTI (urinary tract infection)   . Internal hemorrhoids 11/13/08  . Migraine headache 05/07/2011   h/o atypical migraines  . Rosacea   . Vertigo    Past Surgical History:  Procedure Laterality Date  . COLONOSCOPY  11/13/2008   internal hemrrhoids   Allergies  Allergen Reactions  . Amlodipine   . Desipramine   . Doxycycline Nausea And Vomiting    REACTION: nausea and vomiting  . Lisinopril Cough  . Sulfonamide Derivatives     REACTION: "i can't remember the reaction"   Prior to Admission medications   Medication Sig Start Date End Date Taking? Authorizing Provider  aspirin 81 MG tablet Take 81 mg by mouth daily.      [provider]  atorvastatin (LIPITOR) 40 MG tablet Take 1 tablet (40 mg total) by mouth daily. 10/20/16   McVey, Gelene Mink, PA-C  b complex vitamins tablet Take 1 tablet by mouth daily.    [provider]  cholecalciferol (VITAMIN D) 400 UNITS TABS tablet Take 400 Units by mouth.    [provider]  FLUoxetine (PROZAC) 20 MG tablet Take 1 tablet (20 mg total) by mouth daily. 10/20/16   McVey, Gelene Mink, PA-C  fluticasone (FLONASE) 50 MCG/ACT nasal spray Place 1 spray into both nostrils daily. 01/27/17   Forrest Moron, MD  guaiFENesin (McNeil) 600 MG  12 hr tablet Take 1 tablet (600 mg total) by mouth 2 (two) times daily as needed for to loosen phlegm. 01/27/17   Forrest Moron, MD  losartan-hydrochlorothiazide (HYZAAR) 100-12.5 MG tablet Take 1 tablet by mouth daily. 10/20/16   McVey, Gelene Mink, PA-C  nitroGLYCERIN (NITRODUR - DOSED IN MG/24 HR) 0.2 mg/hr patch Apply 1/4th patch to affected achilles, change daily 01/18/17   Hudnall, Sharyn Lull, MD  Omega-3 Fatty Acids (FISH OIL PO) Take 4 tablets by mouth daily.     [provider]  omeprazole-sodium bicarbonate (ZEGERID) 40-1100 MG capsule TAKE 1 CAPSULE BY MOUTH  AT BEDTIME. 09/07/16   Gatha Mayer, MD  sucralfate (CARAFATE) 1 g tablet TAKE 1 TABLET (1 G TOTAL) BY MOUTH 2 (TWO) TIMES DAILY AS NEEDED. 09/16/15   Wendie Agreste, MD  Urea 39 % CREA APPLY TO AFFECTED AREA EVERY DAY 11/09/16   [provider]   Social History   Socioeconomic History  . Marital status: Married    Spouse name: Not on file  . Number of children: 2  . Years of education: Not on file  . Highest education level: Not on file  Occupational History  . Occupation: Control and instrumentation engineer  Social Needs  . Financial resource strain: Not on file  . Food insecurity:    Worry: Not on file    Inability: Not on file  . Transportation needs:    Medical: Not on file    Non-medical: Not on file  Tobacco Use  . Smoking status: Former Smoker    Packs/day: 0.50    Years: 4.00    Pack years: 2.00    Types: Cigarettes    Last attempt to quit: 07/09/1976    Years since quitting: 41.0  . Smokeless tobacco: Never Used  Substance and Sexual Activity  . Alcohol use: Yes    Alcohol/week: 0.5 oz    Types: 1 Standard drinks or equivalent per week    Comment: 1-2 per month   . Drug use: No  . Sexual activity: Never    Birth control/protection: Abstinence  Lifestyle  . Physical activity:    Days per week: Not on file    Minutes per session: Not on file  . Stress: Not on file  Relationships  . Social connections:    Talks on phone: Not on file    Gets together: Not on file    Attends religious service: Not on file    Active member of club or organization: Not on file    Attends meetings of clubs or organizations: Not on file    Relationship status: Not on file  . Intimate partner violence:    Fear of current or ex partner: Not on file    Emotionally abused: Not on file    Physically abused: Not on file    Forced sexual activity: Not on file  Other Topics Concern  . Not on file  Social History Narrative   1 caffeine drink daily    Review of Systems  Constitutional:  Positive for appetite change and fever.  Respiratory: Negative for chest tightness.   Cardiovascular: Negative for chest pain.  Gastrointestinal: Positive for abdominal pain and diarrhea. Negative for blood in stool, nausea and vomiting.  Neurological: Negative for dizziness and light-headedness.      Objective:   Physical Exam  Constitutional: She is oriented to person, place, and time. She appears well-developed and well-nourished. No distress.  HENT:  Head: Normocephalic and atraumatic.  Eyes: Conjunctivae and EOM are normal.  Neck: Neck supple. No tracheal deviation present.  Cardiovascular: Normal rate and regular rhythm. Exam reveals no gallop and no friction rub.  No murmur heard. Pulmonary/Chest: Effort normal and breath sounds normal. No respiratory distress.  Abdominal: There is no CVA tenderness.  No focal tenderness.   Musculoskeletal: Normal range of motion.  Neurological: She is alert and oriented to person, place, and time.  Skin: Skin is warm and dry.  Psychiatric: She has a normal mood and affect. Her behavior is normal.  Nursing note and vitals reviewed.  Vitals:   07/16/17 1335  BP: 100/60  Pulse: 81  Temp: 98 F (36.7 C)  TempSrc: Oral  SpO2: 96%  Weight: 166 lb 3.2 oz (75.4 kg)  Height: 5\' 7"  (1.702 m)      Assessment & Plan:    Michele Lowery is a 62 y.o. female Diarrhea, unspecified type - Plan: POCT CBC, GI Profile, Stool, PCR, Basic metabolic panel  Essential hypertension - Plan: Basic metabolic panel  Probable viral illness with sick contacts with same.  No known risk factors for C. difficile colitis other than visiting friend in hospital.  Does report possible slight improvement overnight.   - Appears well-hydrated, nontoxic.  Did not feel that IV fluids necessary at this time.  Did discuss oral rehydration therapy, and holding her antihypertensive if still having persistent diarrhea throughout the day as borderline low BP in  office.  -Checked C. difficile/stool testing for infectious cause.  If diarrhea persisted, prescription given for vancomycin 4 times daily for C. difficile treatment.  Other symptomatic care discussed and RTC precautions given.   12:55 PM GI pathogen panel reviewed on April 10.  Positive for Campylobacter.  Based on timing last visit and improving should be symptomatic care only.  Did not appear to have severe symptoms requiring antibiotics at present.  Creatinine reviewed that was slightly elevated, will have her return for lab only visit in the next few days, but should be seen if any worsening or nonimproving symptoms.  Meds ordered this encounter  Medications  . vancomycin (VANCOCIN) 125 MG capsule    Sig: Take 1 capsule (125 mg total) by mouth 4 (four) times daily.    Dispense:  40 capsule    Refill:  0   Patient Instructions   Blood counts look okay today, and other vital signs overall look okay.  Make sure you are drinking plenty of fluids. If you have persistent diarrhea today, you may need to hold your blood pressure medication temporarily.  As symptoms have improved last night into today, this still may be a virus.  Ok to use Immodium few times today if needed. I will check for other infectious causes of diarrhea on the stool test.  If you have more than 6 unformed stools today, start vancomycin that was printed.   Return to the clinic or go to the nearest emergency room if any of your symptoms worsen or new symptoms occur.   Diarrhea, Adult Diarrhea is frequent loose and watery bowel movements. Diarrhea can make you feel weak and cause you to become dehydrated. Dehydration can make you tired and thirsty, cause you to have a dry mouth, and decrease how often you urinate. Diarrhea typically lasts 2-3 days. However, it can last longer if it is a sign of something more serious. It is important to treat your diarrhea as told by your health care provider. Follow these instructions at  home: Eating and  drinking  Follow these recommendations as told by your health care provider:  Take an oral rehydration solution (ORS). This is a drink that is sold at pharmacies and retail stores.  Drink clear fluids, such as water, ice chips, diluted fruit juice, and low-calorie sports drinks.  Eat bland, easy-to-digest foods in small amounts as you are able. These foods include bananas, applesauce, rice, lean meats, toast, and crackers.  Avoid drinking fluids that contain a lot of sugar or caffeine, such as energy drinks, sports drinks, and soda.  Avoid alcohol.  Avoid spicy or fatty foods.  General instructions  Drink enough fluid to keep your urine clear or pale yellow.  Wash your hands often. If soap and water are not available, use hand sanitizer.  Make sure that all people in your household wash their hands well and often.  Take over-the-counter and prescription medicines only as told by your health care provider.  Rest at home while you recover.  Watch your condition for any changes.  Take a warm bath to relieve any burning or pain from frequent diarrhea episodes.  Keep all follow-up visits as told by your health care provider. This is important. Contact a health care provider if:  You have a fever.  Your diarrhea gets worse.  You have new symptoms.  You cannot keep fluids down.  You feel light-headed or dizzy.  You have a headache  You have muscle cramps. Get help right away if:  You have chest pain.  You feel extremely weak or you faint.  You have bloody or black stools or stools that look like tar.  You have severe pain, cramping, or bloating in your abdomen.  You have trouble breathing or you are breathing very quickly.  Your heart is beating very quickly.  Your skin feels cold and clammy.  You feel confused.  You have signs of dehydration, such as: ? Dark urine, very little urine, or no urine. ? Cracked lips. ? Dry mouth. ? Sunken  eyes. ? Sleepiness. ? Weakness. This information is not intended to replace advice given to you by your health care provider. Make sure you discuss any questions you have with your health care provider. Document Released: 03/17/2002 Document Revised: 08/05/2015 Document Reviewed: 12/01/2014 Elsevier Interactive Patient Education  2018 [employer].   IF you received an x-ray today, you will receive an invoice from [facility]. Please contact [facility] at [phone] with questions or concerns regarding your invoice.   IF you received labwork today, you will receive an invoice from [facility]. Please contact [facility] at [phone] with questions or concerns regarding your invoice.   Our billing staff will not be able to assist you with questions regarding bills from these companies.  You will be contacted with the lab results as soon as they are available. The fastest way to get your results is to activate your My Chart account. Instructions are located on the last page of this paperwork. If you have not heard from [place] regarding the results in 2 weeks, please contact this office.       I personally performed the services described in this documentation, which was scribed in my presence. The recorded information has been reviewed and considered for accuracy and completeness, addended by me as needed, and agree with information above.  Signed,   [name], MD [facility] at [place].  07/18/17 12:44 PM

## 2017-07-17 LAB — BASIC METABOLIC PANEL
BUN/Creatinine Ratio: 10 — ABNORMAL LOW (ref 12–28)
BUN: 12 mg/dL (ref 8–27)
CALCIUM: 9.7 mg/dL (ref 8.7–10.3)
CO2: 27 mmol/L (ref 20–29)
CREATININE: 1.16 mg/dL — AB (ref 0.57–1.00)
Chloride: 96 mmol/L (ref 96–106)
GFR, EST AFRICAN AMERICAN: 58 mL/min/{1.73_m2} — AB (ref >59)
GFR, EST NON AFRICAN AMERICAN: 50 mL/min/{1.73_m2} — AB (ref >59)
Glucose: 131 mg/dL — ABNORMAL HIGH (ref 65–99)
Potassium: 3.8 mmol/L (ref 3.5–5.2)
Sodium: 141 mmol/L (ref 134–144)

## 2017-07-18 LAB — GI PROFILE, STOOL, PCR
Adenovirus F 40/41: NOT DETECTED
Astrovirus: NOT DETECTED
C DIFFICILE TOXIN A/B: NOT DETECTED
CAMPYLOBACTER: DETECTED — AB
CRYPTOSPORIDIUM: NOT DETECTED
Cyclospora cayetanensis: NOT DETECTED
ENTEROTOXIGENIC E COLI: NOT DETECTED
Entamoeba histolytica: NOT DETECTED
Enteroaggregative E coli: NOT DETECTED
Enteropathogenic E coli: NOT DETECTED
Giardia lamblia: NOT DETECTED
NOROVIRUS GI/GII: NOT DETECTED
PLESIOMONAS SHIGELLOIDES: NOT DETECTED
ROTAVIRUS A: NOT DETECTED
SALMONELLA: NOT DETECTED
SAPOVIRUS: NOT DETECTED
SHIGELLA/ENTEROINVASIVE E COLI: NOT DETECTED
Shiga-toxin-producing E coli: NOT DETECTED
Vibrio cholerae: NOT DETECTED
Vibrio: NOT DETECTED
Yersinia enterocolitica: NOT DETECTED

## 2017-07-19 ENCOUNTER — Other Ambulatory Visit: Payer: Self-pay | Admitting: *Deleted

## 2017-07-19 MED ORDER — VANCOMYCIN HCL 125 MG PO CAPS: 125.0000 mg | ORAL_CAPSULE | Freq: Four times a day (QID) | ORAL | 0 refills | Status: DC

## 2017-09-10 ENCOUNTER — Ambulatory Visit: Payer: Federal, State, Local not specified - PPO | Admitting: Nurse Practitioner

## 2017-09-10 ENCOUNTER — Encounter: Payer: Self-pay | Admitting: Nurse Practitioner

## 2017-09-10 ENCOUNTER — Encounter

## 2017-09-10 VITALS — BP 120/78 | HR 66 | Ht 66.0 in | Wt 166.2 lb

## 2017-09-10 DIAGNOSIS — R140 Abdominal distension (gaseous): Secondary | ICD-10-CM

## 2017-09-10 DIAGNOSIS — K219 Gastro-esophageal reflux disease without esophagitis: Secondary | ICD-10-CM | POA: Diagnosis not present

## 2017-09-10 MED ORDER — VSL#3 PO CAPS: ORAL_CAPSULE | ORAL | 0 refills | Status: DC

## 2017-09-10 NOTE — Patient Instructions (Signed)
If you are age 64 or older, your body mass index should be between 23-30. Your Body mass index is 26.83 kg/m. If this is out of the aforementioned range listed, please consider follow up with your Primary Care Provider.  If you are age 39 or younger, your body mass index should be between 19-25. Your Body mass index is 26.83 kg/m. If this is out of the aformentioned range listed, please consider follow up with your Primary Care Provider.   You have been given a prescription for VSL to take to [facility] Start Pepcid AC as needed (over-the-counter)  You have been given GERD Literature.  Call in three weeks with an update. If heartburn more than 3 times a week, then call sooner.  Thank you for choosing me and [facility].   Tye Savoy, NP

## 2017-09-10 NOTE — Progress Notes (Addendum)
IMPRESSION and PLAN:    #1. 62 yo female with diarrhea, + campylobacter in April. Diarrhea resolved after 12 days but still with lingering upper abdominal discomfort / excessive gas.   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 Also eating more carbs now which can also lead to bloating in some people.  -Trial of VSL #3 x 2 months -Gave her a copy of some foods listed on low FODMAP diet so she can see which fruits are more likely to result in bloating and gas -Call 3 to 4 weeks with a condition update, sooner if needed.      #2.  GERD.  Insurance stopped paying for omeprazole in March.  Patient was not having any GERD symptoms so she did not seek alternative medication.  She also stop Carafate at the time.  So far no burning or GERD symptoms, patient inquires about whether medications should be resumed -I do not think her current symptoms are GERD related.  As long as she is asymptomatic from a GERD standpoint then I think holding off on PPI and Carafate is fine.  She can try Pepcid AC as needed.  If requiring more than 3 doses a week then will need to entertain restarting maintenance GERD medicine  -continue anti-reflux measures      Agree with Michele Lowery assessment and plan. Gatha Mayer, MD, [facility]    HPI:    Chief Complaint: bloating Michele Lowery   Patient is 62 year old female known to Dr. Carlean Purl.  She has a history of GERD and had a screening colonoscopy August 2010 which was normal except for internal hemorrhoids  Michele Lowery was diagnosed with campylobacter in mid April. Diarrhea resolved within 12 days but still having upper abdominal pain / bloating / excessive belching and flatus. The upper abdominal pain is not necessarily related to eating. It is non-radiating. She has had some nausea, no vomiting. She has early satiety. Lost 10 pounds, gained back all but 4 pounds. Eating a lot of carbs. No appetite for salad anymore. . Bowels are  back to normal. Passes a tiny amount of blood one time near end of course of diarrhea.   Michele Lowery has a history of GERD but currently not having any of the typical burning.  No regurgitation.  Sleeps with her head of bed elevated and goes to bed on an empty stomach.  Insurance stopped covering omeprazole in March.  Patient decided not to continue the omeprazole and also stop Carafate at the same time.  Feels that GERD symptoms are in remission for now.   Review of systems:     No chest pain, no SOB, no fevers, no urinary sx   Past Medical History:  Diagnosis Date  . Anxiety   . Depression   . HLD (hyperlipidemia)   . HTN (hypertension)   . Hx: UTI (urinary tract infection)   . Internal hemorrhoids 11/13/08  . Migraine headache 05/07/2011   h/o atypical migraines  . Rosacea   . Vertigo     Patient's surgical history, family medical history, social history, medications and allergies were all reviewed in Epic    Physical Exam:     Ht 5\' 6"  (1.676 m)   Wt 166 lb 3.2 oz (75.4 kg)   BMI 26.83 kg/m   GENERAL:  Pleasant female in NAD PSYCH: : Cooperative, normal affect EENT:  conjunctiva pink, mucous membranes moist, neck supple without masses CARDIAC:  RRR, no murmur heard, no peripheral edema  PULM: Normal respiratory effort, lungs CTA bilaterally, no wheezing ABDOMEN:  Nondistended, soft, nontender. No obvious masses, no hepatomegaly,  normal bowel sounds SKIN:  turgor, no lesions seen Musculoskeletal:  Normal muscle tone, normal strength NEURO: Alert and oriented x 3, no focal neurologic deficits   Tye Savoy , NP 09/10/2017, 9:02 AM

## 2017-10-12 ENCOUNTER — Encounter: Payer: Self-pay | Admitting: Family Medicine

## 2017-10-31 ENCOUNTER — Encounter: Payer: Self-pay | Admitting: Family Medicine

## 2017-10-31 NOTE — Telephone Encounter (Signed)
See my chart email.  Letter has been completed.  Please send a copy of the pathology report and letter to the necessary party.  Let me know if there are further questions.

## 2017-12-19 ENCOUNTER — Other Ambulatory Visit: Payer: Self-pay | Admitting: Physician Assistant

## 2017-12-19 DIAGNOSIS — F329 Major depressive disorder, single episode, unspecified: Secondary | ICD-10-CM

## 2017-12-19 DIAGNOSIS — F32A Depression, unspecified: Secondary | ICD-10-CM

## 2017-12-31 ENCOUNTER — Telehealth: Payer: Self-pay | Admitting: Family Medicine

## 2017-12-31 NOTE — Telephone Encounter (Signed)
The patient brought by her EOB in regards to some lab work she had done on 07/16/2017. There was suppose to of been a letter sent to [employer] along with her pathology report so that the insurance company would pay for the lab work if it was proved to be medically necessary. The telephone number on file is correct. And the EOB is placed in the providers box.

## 2018-01-01 ENCOUNTER — Encounter: Payer: Self-pay | Admitting: Family Medicine

## 2018-01-02 NOTE — Telephone Encounter (Signed)
Patient is going to call back with fax

## 2018-01-02 NOTE — Telephone Encounter (Signed)
Pt is calling back the fax number [phone]. Please included in EOB , and copy of letter and pathology report

## 2018-01-03 ENCOUNTER — Encounter: Payer: Self-pay | Admitting: Gastroenterology

## 2018-01-03 ENCOUNTER — Ambulatory Visit: Payer: Federal, State, Local not specified - PPO | Admitting: Gastroenterology

## 2018-01-03 VITALS — BP 120/70 | HR 87 | Ht 66.0 in | Wt 170.0 lb

## 2018-01-03 DIAGNOSIS — R1013 Epigastric pain: Secondary | ICD-10-CM | POA: Diagnosis not present

## 2018-01-03 DIAGNOSIS — R142 Eructation: Secondary | ICD-10-CM

## 2018-01-03 MED ORDER — PANTOPRAZOLE SODIUM 40 MG PO TBEC: 40.0000 mg | DELAYED_RELEASE_TABLET | Freq: Every day | ORAL | 1 refills | Status: DC

## 2018-01-03 NOTE — Telephone Encounter (Signed)
Faxed successfully

## 2018-01-03 NOTE — Patient Instructions (Addendum)
If you are age 64 or older, your body mass index should be between 23-30. Your Body mass index is 27.44 kg/m. If this is out of the aforementioned range listed, please consider follow up with your Primary Care Provider.  If you are age 21 or younger, your body mass index should be between 19-25. Your Body mass index is 27.44 kg/m. If this is out of the aformentioned range listed, please consider follow up with your Primary Care Provider.   We have sent the following medications to your pharmacy for you to pick up at your convenience: Pantoprazole 40 mg  Call in 2-3 weeks with an update.  Ask for Chong Sicilian, RN.  Thank you for choosing me and [facility].   Alonza Bogus, PA-C

## 2018-01-03 NOTE — Progress Notes (Signed)
01/03/2018 Michele Lowery [id number] 01/29/1956   HISTORY OF PRESENT ILLNESS:  This is a 62 year old female who is a patient of Dr. Celesta Aver.  She presents here today with complaints of epigastric abdominal discomfort and constant belching.  Says that she was up until 1:30 am the other night due to discomfort.  She does have a history of anxiety.  She was diagnosed with Campylobacter in April and feels that her gut just has not been the same since.  Was seen here by our NP in June and she thought possible post-infectious IBS.  She recommended VSL #3 probiotic and pepcid prn.  No much improvement in symptoms.  Has been using pepcid quite often and wondering if she should go back on a daily PPI.  Never had EGD in the past.   Past Medical History:  Diagnosis Date  . Anxiety   . Depression   . HLD (hyperlipidemia)   . HTN (hypertension)   . Hx: UTI (urinary tract infection)   . Internal hemorrhoids 11/13/08  . Migraine headache 05/07/2011   h/o atypical migraines  . Rosacea   . Vertigo    Past Surgical History:  Procedure Laterality Date  . COLONOSCOPY  11/13/2008   internal hemrrhoids    reports that she quit smoking about 41 years ago. Her smoking use included cigarettes. She has a 2.00 pack-year smoking history. She has never used smokeless tobacco. She reports that she drinks about 1.0 standard drinks of alcohol per week. She reports that she does not use drugs. family history includes COPD in her mother; Heart disease in her father. Allergies  Allergen Reactions  . Amlodipine   . Desipramine   . Doxycycline Nausea And Vomiting    REACTION: nausea and vomiting  . Lisinopril Cough  . Sulfonamide Derivatives     REACTION: "i can't remember the reaction"      Outpatient Encounter Medications as of 01/03/2018  Medication Sig  . aspirin 81 MG tablet Take 81 mg by mouth daily.    Marland Kitchen atorvastatin (LIPITOR) 40 MG tablet Take 1 tablet (40 mg total) by mouth daily.  Marland Kitchen b complex  vitamins tablet Take 1 tablet by mouth daily.  . cholecalciferol (VITAMIN D) 400 UNITS TABS tablet Take 400 Units by mouth.  Marland Kitchen FLUoxetine (PROZAC) 20 MG tablet TAKE 1 TABLET BY MOUTH EVERY DAY  . losartan-hydrochlorothiazide (HYZAAR) 100-12.5 MG tablet Take 1 tablet by mouth daily.  . Omega-3 Fatty Acids (FISH OIL PO) Take 4 tablets by mouth daily.   . [DISCONTINUED] Probiotic Product (VSL#3) CAPS Take 2 two times daily for 14 days, then 2 daily for 2 months.  . [DISCONTINUED] Urea 39 % CREA APPLY TO AFFECTED AREA EVERY DAY   No facility-administered encounter medications on file as of 01/03/2018.      REVIEW OF SYSTEMS  : All other systems reviewed and negative except where noted in the History of Present Illness.   PHYSICAL EXAM: BP 120/70   Pulse 87 Comment: pt used her smart watch to give pulse  Ht 5\' 6"  (1.676 m)   Wt 170 lb (77.1 kg)   BMI 27.44 kg/m  General: Well developed white female in no acute distress Head: Normocephalic and atraumatic Eyes:  Sclerae anicteric, conjunctiva pink. Ears: Normal auditory acuity Lungs: Clear throughout to auscultation; no increased WOB. Heart: Regular rate and rhythm; no M/R/G. Abdomen: Soft, non-distended.  BS present.  Mild epigastric TTP. Musculoskeletal: Symmetrical with no gross deformities  Skin:  No lesions on visible extremities Extremities: No edema  Neurological: Alert oriented x 4, grossly non-focal Psychological:  Alert and cooperative. Normal mood and affect  ASSESSMENT AND PLAN: *Epigastric abdominal pain and belching:  ? GERD related.  Had not been on PPI or other acid reducing medication.  Will start pantoprazole 40 mg daily and she will call back in 3 weeks with an update on her symptoms.  Never had EGD.  If symptoms continue despite PPI then may need to schedule EGD with Dr. Carlean Purl.   CC:  Wendie Agreste, MD

## 2018-01-14 DIAGNOSIS — R197 Diarrhea, unspecified: Secondary | ICD-10-CM

## 2018-01-14 DIAGNOSIS — R142 Eructation: Secondary | ICD-10-CM | POA: Insufficient documentation

## 2018-01-14 DIAGNOSIS — R1013 Epigastric pain: Secondary | ICD-10-CM | POA: Insufficient documentation

## 2018-01-15 MED ORDER — DICYCLOMINE HCL 10 MG PO CAPS: 10.0000 mg | ORAL_CAPSULE | Freq: Two times a day (BID) | ORAL | 0 refills | Status: DC

## 2018-01-15 NOTE — Telephone Encounter (Signed)
Loralie Champagne, PA-C  Sent: Mon January 14, 2018 5:33 PM  To: Marlon Pel, RN      Message   This is something different than what I saw her for. We can do a stool pathogen panel. Let's just do a CBC as well for the bleeding. Also can give Bentyl 10 mg BID for cramping and can use Imodium prn for now as well. Let's do a CBC as well in regards to the bleeding.

## 2018-01-17 ENCOUNTER — Other Ambulatory Visit: Payer: Self-pay | Admitting: Physician Assistant

## 2018-01-17 ENCOUNTER — Telehealth: Payer: Self-pay | Admitting: Family Medicine

## 2018-01-17 DIAGNOSIS — I10 Essential (primary) hypertension: Secondary | ICD-10-CM

## 2018-01-17 NOTE — Telephone Encounter (Signed)
I called pt and let her know she needed a 6 month f/u appt with Dr. Carlota Raspberry to continue getting her Hyzaar for BP.    I told her I will give her a 30 day supply but she informed me,   "I don't need the refill".   "I have plenty of pills left".  "I have been in to see Dr. Carlota Raspberry and they checked my BP and it has been fine".   "I was just there in April".    She wants to call back and schedule a physical when I offered to schedule her while on the phone.  Hyzaar refill not done at this time per pt request.

## 2018-01-18 ENCOUNTER — Other Ambulatory Visit: Payer: Self-pay | Admitting: Physician Assistant

## 2018-01-18 DIAGNOSIS — E785 Hyperlipidemia, unspecified: Secondary | ICD-10-CM

## 2018-01-18 NOTE — Telephone Encounter (Signed)
Requested medication (s) are due for refill today: yes  Requested medication (s) are on the active medication list: yes  Last refill:  10/20/16  Future visit scheduled: no      Requested Prescriptions  Pending Prescriptions Disp Refills   atorvastatin (LIPITOR) 40 MG tablet [Pharmacy Med Name: ATORVASTATIN 40 MG TABLET] 90 tablet 4    Sig: TAKE 1 TABLET BY MOUTH EVERY DAY     Cardiovascular:  Antilipid - Statins Failed - 01/18/2018 11:02 AM      Failed - Total Cholesterol in normal range and within 360 days    Cholesterol, Total  Date Value Ref Range Status  10/20/2016 213 (H) 100 - 199 mg/dL Final         Failed - LDL in normal range and within 360 days    LDL Calculated  Date Value Ref Range Status  10/20/2016 115 (H) 0 - 99 mg/dL Final         Failed - HDL in normal range and within 360 days    HDL  Date Value Ref Range Status  10/20/2016 58 >39 mg/dL Final         Failed - Triglycerides in normal range and within 360 days    Triglycerides  Date Value Ref Range Status  10/20/2016 200 (H) 0 - 149 mg/dL Final         Passed - Patient is not pregnant      Passed - Valid encounter within last 12 months    Recent Outpatient Visits          6 months ago Diarrhea, unspecified type   [facility], Ranell Patrick, MD   11 months ago Acute URI   [facility], Arlie Solomons, MD   1 year ago Encounter for medication management   [facility], Gelene Mink, PA-C   2 years ago Chest tightness or pressure   [facility], Laurey Arrow, MD   2 years ago Gastroesophageal reflux disease without esophagitis   [facility], Ranell Patrick, MD

## 2018-01-29 ENCOUNTER — Other Ambulatory Visit: Payer: Self-pay | Admitting: Gastroenterology

## 2018-02-05 ENCOUNTER — Other Ambulatory Visit: Payer: Self-pay | Admitting: Family Medicine

## 2018-02-05 DIAGNOSIS — Z1231 Encounter for screening mammogram for malignant neoplasm of breast: Secondary | ICD-10-CM

## 2018-02-08 ENCOUNTER — Other Ambulatory Visit: Payer: Self-pay

## 2018-02-08 MED ORDER — DICYCLOMINE HCL 10 MG PO CAPS: 10.0000 mg | ORAL_CAPSULE | Freq: Two times a day (BID) | ORAL | 1 refills | Status: DC

## 2018-02-11 ENCOUNTER — Ambulatory Visit
Admission: RE | Admit: 2018-02-11 | Discharge: 2018-02-11 | Disposition: A | Discharge status: Home / Self Care | Payer: Federal, State, Local not specified - PPO | Source: Ambulatory Visit | Attending: Family Medicine | Admitting: Family Medicine

## 2018-02-11 DIAGNOSIS — Z1231 Encounter for screening mammogram for malignant neoplasm of breast: Secondary | ICD-10-CM | POA: Diagnosis not present

## 2018-02-12 ENCOUNTER — Other Ambulatory Visit: Payer: Self-pay | Admitting: Family Medicine

## 2018-02-12 DIAGNOSIS — R928 Other abnormal and inconclusive findings on diagnostic imaging of breast: Secondary | ICD-10-CM

## 2018-02-14 ENCOUNTER — Ambulatory Visit
Admission: RE | Admit: 2018-02-14 | Discharge: 2018-02-14 | Disposition: A | Discharge status: Home / Self Care | Payer: Federal, State, Local not specified - PPO | Source: Ambulatory Visit | Attending: Family Medicine | Admitting: Family Medicine

## 2018-02-14 DIAGNOSIS — R928 Other abnormal and inconclusive findings on diagnostic imaging of breast: Secondary | ICD-10-CM

## 2018-02-14 DIAGNOSIS — N6324 Unspecified lump in the left breast, lower inner quadrant: Secondary | ICD-10-CM | POA: Diagnosis not present

## 2018-02-14 DIAGNOSIS — N6323 Unspecified lump in the left breast, lower outer quadrant: Secondary | ICD-10-CM | POA: Diagnosis not present

## 2018-02-15 ENCOUNTER — Telehealth: Payer: Self-pay | Admitting: *Deleted

## 2018-02-15 NOTE — Telephone Encounter (Signed)
Pt returned call, I spoke with her in regards to medication Non-adherence faxed note from [facility] and [employer]. She stated that "she is taking Atorvastatin 40 mg and Losartan/HCT 100-12.5." Pt scheduled CPE for 02/21/18 with Dr. Nolon Rod.

## 2018-02-15 NOTE — Telephone Encounter (Signed)
LMOM for pt to call the office. The call was placed to check to see if she is compliant with taking Losartan/HCT 100-12.5 and Atorvastatin 40 mg per not from [employer].

## 2018-02-21 ENCOUNTER — Encounter: Payer: Self-pay | Admitting: Family Medicine

## 2018-02-21 ENCOUNTER — Other Ambulatory Visit: Payer: Self-pay

## 2018-02-21 ENCOUNTER — Ambulatory Visit (INDEPENDENT_AMBULATORY_CARE_PROVIDER_SITE_OTHER): Payer: Federal, State, Local not specified - PPO | Admitting: Family Medicine

## 2018-02-21 VITALS — BP 131/79 | HR 67 | Temp 98.5°F | Resp 17 | Ht 66.0 in | Wt 172.6 lb

## 2018-02-21 DIAGNOSIS — I10 Essential (primary) hypertension: Secondary | ICD-10-CM

## 2018-02-21 DIAGNOSIS — E785 Hyperlipidemia, unspecified: Secondary | ICD-10-CM

## 2018-02-21 DIAGNOSIS — Z124 Encounter for screening for malignant neoplasm of cervix: Secondary | ICD-10-CM

## 2018-02-21 DIAGNOSIS — Z0000 Encounter for general adult medical examination without abnormal findings: Secondary | ICD-10-CM

## 2018-02-21 DIAGNOSIS — E782 Mixed hyperlipidemia: Secondary | ICD-10-CM | POA: Diagnosis not present

## 2018-02-21 MED ORDER — ATORVASTATIN CALCIUM 40 MG PO TABS: 40.0000 mg | ORAL_TABLET | Freq: Every day | ORAL | 3 refills | Status: DC

## 2018-02-21 MED ORDER — LOSARTAN POTASSIUM-HCTZ 100-12.5 MG PO TABS: 1.0000 | ORAL_TABLET | Freq: Every day | ORAL | 3 refills | Status: DC

## 2018-02-21 NOTE — Patient Instructions (Addendum)
If you have lab work done today you will be contacted with your lab results within the next 2 weeks.  If you have not heard from [place] then please contact us. The fastest way to get your results is to register for My Chart.   IF you received an x-ray today, you will receive an invoice from [facility]. Please contact [facility] at [phone] with questions or concerns regarding your invoice.   IF you received labwork today, you will receive an invoice from [facility]. Please contact [facility] at [phone] with questions or concerns regarding your invoice.   Our billing staff will not be able to assist you with questions regarding bills from these companies.  You will be contacted with the lab results as soon as they are available. The fastest way to get your results is to activate your My Chart account. Instructions are located on the last page of this paperwork. If you have not heard from [place] regarding the results in 2 weeks, please contact this office.     Preventive Care 40-64 Years, Female Preventive care refers to lifestyle choices and visits with your health care provider that can promote health and wellness. What does preventive care include?  A yearly physical exam. This is also called an annual well check.  Dental exams once or twice a year.  Routine eye exams. Ask your health care provider how often you should have your eyes checked.  Personal lifestyle choices, including: ? Daily care of your teeth and gums. ? Regular physical activity. ? Eating a healthy diet. ? Avoiding tobacco and drug use. ? Limiting alcohol use. ? Practicing safe sex. ? Taking low-dose aspirin daily starting at age 77. ? Taking vitamin and mineral supplements as recommended by your health care provider. What happens during an annual well check? The services and screenings done by your health care provider during your annual well check will depend on your age, overall health,  lifestyle risk factors, and family history of disease. Counseling Your health care provider may ask you questions about your:  Alcohol use.  Tobacco use.  Drug use.  Emotional well-being.  Home and relationship well-being.  Sexual activity.  Eating habits.  Work and work Statistician.  Method of birth control.  Menstrual cycle.  Pregnancy history.  Screening You may have the following tests or measurements:  Height, weight, and BMI.  Blood pressure.  Lipid and cholesterol levels. These may be checked every 5 years, or more frequently if you are over 8 years old.  Skin check.  Lung cancer screening. You may have this screening every year starting at age 54 if you have a 30-pack-year history of smoking and currently smoke or have quit within the past 15 years.  Fecal occult blood test (FOBT) of the stool. You may have this test every year starting at age 89.  Flexible sigmoidoscopy or colonoscopy. You may have a sigmoidoscopy every 5 years or a colonoscopy every 10 years starting at age 67.  Hepatitis C blood test.  Hepatitis B blood test.  Sexually transmitted disease (STD) testing.  Diabetes screening. This is done by checking your blood sugar (glucose) after you have not eaten for a while (fasting). You may have this done every 1-3 years.  Mammogram. This may be done every 1-2 years. Talk to your health care provider about when you should start having regular mammograms. This may depend on whether you have a family history of breast cancer.  BRCA-related cancer screening.  This may be done if you have a family history of breast, ovarian, tubal, or peritoneal cancers.  Pelvic exam and Pap test. This may be done every 3 years starting at age 4. Starting at age 59, this may be done every 5 years if you have a Pap test in combination with an HPV test.  Bone density scan. This is done to screen for osteoporosis. You may have this scan if you are at high risk for  osteoporosis.  Discuss your test results, treatment options, and if necessary, the need for more tests with your health care provider. Vaccines Your health care provider may recommend certain vaccines, such as:  Influenza vaccine. This is recommended every year.  Tetanus, diphtheria, and acellular pertussis (Tdap, Td) vaccine. You may need a Td booster every 10 years.  Varicella vaccine. You may need this if you have not been vaccinated.  Zoster vaccine. You may need this after age 67.  Measles, mumps, and rubella (MMR) vaccine. You may need at least one dose of MMR if you were born in 1957 or later. You may also need a second dose.  Pneumococcal 13-valent conjugate (PCV13) vaccine. You may need this if you have certain conditions and were not previously vaccinated.  Pneumococcal polysaccharide (PPSV23) vaccine. You may need one or two doses if you smoke cigarettes or if you have certain conditions.  Meningococcal vaccine. You may need this if you have certain conditions.  Hepatitis A vaccine. You may need this if you have certain conditions or if you travel or work in places where you may be exposed to hepatitis A.  Hepatitis B vaccine. You may need this if you have certain conditions or if you travel or work in places where you may be exposed to hepatitis B.  Haemophilus influenzae type b (Hib) vaccine. You may need this if you have certain conditions.  Talk to your health care provider about which screenings and vaccines you need and how often you need them. This information is not intended to replace advice given to you by your health care provider. Make sure you discuss any questions you have with your health care provider. Document Released: 04/23/2015 Document Revised: 12/15/2015 Document Reviewed: 01/26/2015 Elsevier Interactive Patient Education  2018 [place] Years, Female Preventive care refers to lifestyle choices and visits with your health  care provider that can promote health and wellness. What does preventive care include?  A yearly physical exam. This is also called an annual well check.  Dental exams once or twice a year.  Routine eye exams. Ask your health care provider how often you should have your eyes checked.  Personal lifestyle choices, including: ? Daily care of your teeth and gums. ? Regular physical activity. ? Eating a healthy diet. ? Avoiding tobacco and drug use. ? Limiting alcohol use. ? Practicing safe sex. ? Taking low-dose aspirin daily starting at age 12. ? Taking vitamin and mineral supplements as recommended by your health care provider. What happens during an annual well check? The services and screenings done by your health care provider during your annual well check will depend on your age, overall health, lifestyle risk factors, and family history of disease. Counseling Your health care provider may ask you questions about your:  Alcohol use.  Tobacco use.  Drug use.  Emotional well-being.  Home and relationship well-being.  Sexual activity.  Eating habits.  Work and work Statistician.  Method of birth control.  Menstrual cycle.  Pregnancy  history.  Screening You may have the following tests or measurements:  Height, weight, and BMI.  Blood pressure.  Lipid and cholesterol levels. These may be checked every 5 years, or more frequently if you are over 47 years old.  Skin check.  Lung cancer screening. You may have this screening every year starting at age 71 if you have a 30-pack-year history of smoking and currently smoke or have quit within the past 15 years.  Fecal occult blood test (FOBT) of the stool. You may have this test every year starting at age 2.  Flexible sigmoidoscopy or colonoscopy. You may have a sigmoidoscopy every 5 years or a colonoscopy every 10 years starting at age 26.  Hepatitis C blood test.  Hepatitis B blood test.  Sexually  transmitted disease (STD) testing.  Diabetes screening. This is done by checking your blood sugar (glucose) after you have not eaten for a while (fasting). You may have this done every 1-3 years.  Mammogram. This may be done every 1-2 years. Talk to your health care provider about when you should start having regular mammograms. This may depend on whether you have a family history of breast cancer.  BRCA-related cancer screening. This may be done if you have a family history of breast, ovarian, tubal, or peritoneal cancers.  Pelvic exam and Pap test. This may be done every 3 years starting at age 55. Starting at age 60, this may be done every 5 years if you have a Pap test in combination with an HPV test.  Bone density scan. This is done to screen for osteoporosis. You may have this scan if you are at high risk for osteoporosis.  Discuss your test results, treatment options, and if necessary, the need for more tests with your health care provider. Vaccines Your health care provider may recommend certain vaccines, such as:  Influenza vaccine. This is recommended every year.  Tetanus, diphtheria, and acellular pertussis (Tdap, Td) vaccine. You may need a Td booster every 10 years.  Varicella vaccine. You may need this if you have not been vaccinated.  Zoster vaccine. You may need this after age 28.  Measles, mumps, and rubella (MMR) vaccine. You may need at least one dose of MMR if you were born in 1957 or later. You may also need a second dose.  Pneumococcal 13-valent conjugate (PCV13) vaccine. You may need this if you have certain conditions and were not previously vaccinated.  Pneumococcal polysaccharide (PPSV23) vaccine. You may need one or two doses if you smoke cigarettes or if you have certain conditions.  Meningococcal vaccine. You may need this if you have certain conditions.  Hepatitis A vaccine. You may need this if you have certain conditions or if you travel or work in  places where you may be exposed to hepatitis A.  Hepatitis B vaccine. You may need this if you have certain conditions or if you travel or work in places where you may be exposed to hepatitis B.  Haemophilus influenzae type b (Hib) vaccine. You may need this if you have certain conditions.  Talk to your health care provider about which screenings and vaccines you need and how often you need them. This information is not intended to replace advice given to you by your health care provider. Make sure you discuss any questions you have with your health care provider. Document Released: 04/23/2015 Document Revised: 12/15/2015 Document Reviewed: 01/26/2015 Elsevier Interactive Patient Education  [employer].

## 2018-02-21 NOTE — Progress Notes (Signed)
Chief Complaint  Patient presents with  . Annual Exam    cpe with pap    Subjective:  Michele Lowery is a 62 y.o. female here for a health maintenance visit.  Patient is established pt  She reports that she is not exercising that much She belongs to a gym She reports that she plans to resume exercise  Wt Readings from Last 3 Encounters:  02/21/18 172 lb 9.6 oz (78.3 kg)  01/03/18 170 lb (77.1 kg)  09/10/17 166 lb 3.2 oz (75.4 kg)     Patient Active Problem List   Diagnosis Date Noted  . Belching 01/14/2018  . Epigastric pain 01/14/2018  . Right Achilles tendinitis 01/19/2017  . Stress incontinence 12/23/2014  . Peripheral neuropathy 12/17/2013  . Rosacea   . HLD (hyperlipidemia)   . HTN (hypertension)   . Depression   . ? Hernia of abdominal wall - epigastrium 10/17/2010    Class: Question of  . Heartburn 10/17/2010  . Overweight (BMI 25.0-29.9) 10/17/2010    Past Medical History:  Diagnosis Date  . Anxiety   . Depression   . HLD (hyperlipidemia)   . HTN (hypertension)   . Hx: UTI (urinary tract infection)   . Internal hemorrhoids 11/13/08  . Migraine headache 05/07/2011   h/o atypical migraines  . Rosacea   . Vertigo     Past Surgical History:  Procedure Laterality Date  . COLONOSCOPY  11/13/2008   internal hemrrhoids     Outpatient Medications Prior to Visit  Medication Sig Dispense Refill  . aspirin 81 MG tablet Take 81 mg by mouth daily.      Marland Kitchen b complex vitamins tablet Take 1 tablet by mouth daily.    . cholecalciferol (VITAMIN D) 400 UNITS TABS tablet Take 400 Units by mouth.    Marland Kitchen FLUoxetine (PROZAC) 20 MG tablet TAKE 1 TABLET BY MOUTH EVERY DAY 90 tablet 0  . Omega-3 Fatty Acids (FISH OIL PO) Take 4 tablets by mouth daily.     . pantoprazole (PROTONIX) 40 MG tablet Take 1 tablet (40 mg total) by mouth daily. Take before dinner 90 tablet 1  . atorvastatin (LIPITOR) 40 MG tablet Take 1 tablet (40 mg total) by mouth daily. 90 tablet 4  . dicyclomine  (BENTYL) 10 MG capsule Take 1 capsule (10 mg total) by mouth 2 (two) times daily. 180 capsule 1  . losartan-hydrochlorothiazide (HYZAAR) 100-12.5 MG tablet Take 1 tablet by mouth daily. 90 tablet 4   No facility-administered medications prior to visit.     Allergies  Allergen Reactions  . Amlodipine   . Desipramine   . Doxycycline Nausea And Vomiting    REACTION: nausea and vomiting  . Lisinopril Cough  . Sulfonamide Derivatives     REACTION: "i can't remember the reaction"     Family History  Problem Relation Age of Onset  . COPD Mother   . Heart disease Father      Health Habits: Dental Exam: up to date Eye Exam: up to date Exercise: 0 times/week on average Current exercise activities: none Diet: balanced  Social History   Socioeconomic History  . Marital status: Married    Spouse name: Not on file  . Number of children: 2  . Years of education: Not on file  . Highest education level: Not on file  Occupational History  . Occupation: Control and instrumentation engineer  Social Needs  . Financial resource strain: Not on file  . Food insecurity:    Worry:  Not on file    Inability: Not on file  . Transportation needs:    Medical: Not on file    Non-medical: Not on file  Tobacco Use  . Smoking status: Former Smoker    Packs/day: 0.50    Years: 4.00    Pack years: 2.00    Types: Cigarettes    Last attempt to quit: 07/09/1976    Years since quitting: 41.6  . Smokeless tobacco: Never Used  Substance and Sexual Activity  . Alcohol use: Yes    Alcohol/week: 1.0 standard drinks    Types: 1 Standard drinks or equivalent per week    Comment: 1-2 per month   . Drug use: No  . Sexual activity: Never    Birth control/protection: Abstinence  Lifestyle  . Physical activity:    Days per week: Not on file    Minutes per session: Not on file  . Stress: Not on file  Relationships  . Social connections:    Talks on phone: Not on file    Gets together: Not on file    Attends  religious service: Not on file    Active member of club or organization: Not on file    Attends meetings of clubs or organizations: Not on file    Relationship status: Not on file  . Intimate partner violence:    Fear of current or ex partner: Not on file    Emotionally abused: Not on file    Physically abused: Not on file    Forced sexual activity: Not on file  Other Topics Concern  . Not on file  Social History Narrative   1 caffeine drink daily    Social History   Substance and Sexual Activity  Alcohol Use Yes  . Alcohol/week: 1.0 standard drinks  . Types: 1 Standard drinks or equivalent per week   Comment: 1-2 per month    Social History   Tobacco Use  Smoking Status Former Smoker  . Packs/day: 0.50  . Years: 4.00  . Pack years: 2.00  . Types: Cigarettes  . Last attempt to quit: 07/09/1976  . Years since quitting: 41.6  Smokeless Tobacco Never Used   Social History   Substance and Sexual Activity  Drug Use No    GYN: Sexual Health Menstrual status: regular menses LMP: No LMP recorded. Patient is postmenopausal. Last pap smear: see HM section History of abnormal pap smears:  Sexually active: with female partner Current contraception: none  Health Maintenance: See under health Maintenance activity for review of completion dates as well. Immunization History  Administered Date(s) Administered  . Influenza Inj Mdck Quad Pf 02/10/2018  . Influenza Split 03/14/2013  . Influenza,inj,Quad PF,6+ Mos 12/21/2014, 01/27/2017  . Td 02/17/2004  . Tdap 12/11/2012  . Zoster 01/21/2014      Depression Screen-PHQ2/9 Depression screen [facility] 2/9 02/21/2018 07/16/2017 01/27/2017 10/20/2016 08/07/2015  Decreased Interest 0 0 0 0 0  Down, Depressed, Hopeless 0 0 0 0 0  PHQ - 2 Score 0 0 0 0 0       Depression Severity and Treatment Recommendations:  0-4= None  5-9= Mild / Treatment: Support, educate to call if worse; return in one month  10-14= Moderate / Treatment:  Support, watchful waiting; Antidepressant or Psycotherapy  15-19= Moderately severe / Treatment: Antidepressant OR Psychotherapy  >= 20 = Major depression, severe / Antidepressant AND Psychotherapy    Review of Systems   Review of Systems  Constitutional: Negative for chills and fever.  HENT: Negative for congestion and sinus pain.   Respiratory: Negative for cough, shortness of breath, wheezing and stridor.   Cardiovascular: Negative for chest pain, palpitations, orthopnea and claudication.  Gastrointestinal: Negative for abdominal pain, diarrhea, nausea and vomiting.  Genitourinary: Negative for dysuria, frequency and urgency.  Skin: Negative for itching and rash.  Neurological: Negative for dizziness, tingling, tremors and headaches.  Psychiatric/Behavioral: Negative for depression. The patient is not nervous/anxious.     See HPI for ROS as well.    Objective:   Vitals:   02/21/18 0926  BP: 131/79  Pulse: 67  Resp: 17  Temp: 98.5 F (36.9 C)  TempSrc: Oral  SpO2: 95%  Weight: 172 lb 9.6 oz (78.3 kg)  Height: _0  (1.676 m)    Body mass index is 27.86 kg/m.  Physical Exam  Constitutional: She is oriented to person, place, and time. She appears well-developed and well-nourished.  HENT:  Head: Normocephalic and atraumatic.  Eyes: Conjunctivae and EOM are normal.  Neck: Normal range of motion. No thyromegaly present.  Cardiovascular: Normal rate, regular rhythm and normal heart sounds.  No murmur heard. Pulmonary/Chest: Effort normal and breath sounds normal. No stridor. No respiratory distress. She has no wheezes.  Abdominal: Soft. Bowel sounds are normal. She exhibits no distension and no mass. There is no tenderness. There is no guarding.  Neurological: She is alert and oriented to person, place, and time.  Skin: Skin is warm. Capillary refill takes less than 2 seconds. No erythema.  Psychiatric: She has a normal mood and affect. Her behavior is normal. Judgment  and thought content normal.   Vaginal exam- Chaperone Present Labia normal bilaterally without skin lesions Urethral meatus normal appearing but shows mucosal atrophy Vagina without discharge No CMT, ovaries small and not palpable Uterus midline, nontender Pap smear performed    Assessment/Plan:   Patient was seen for a health maintenance exam.  Counseled the patient on health maintenance issues. Reviewed her health mainteance schedule and ordered appropriate tests (see orders.) Counseled on regular exercise and weight management. Recommend regular eye exams and dental cleaning.   The following issues were addressed today for health maintenance:   Agnes was seen today for annual exam.  Diagnoses and all orders for this visit:  Encounter for health maintenance examination-  Women's Health Maintenance Plan Advised monthly breast exam and annual mammogram Advised dental exam every six months Discussed stress management Discussed pap smear screening guidelines    Encounter for screening for cervical cancer - discussed pap smear screening guidelines -     Pap IG, CT/NG NAA, and HPV (high risk) [facility]  Mixed hyperlipidemia- discussed lipid screening -     Lipid panel -     CMP14+EGFR  Essential hypertension- Patient's blood pressure is at goal of 139/89 or less. Condition is stable. Continue current medications and treatment plan. I recommend that you exercise for 30-45 minutes 5 days a week. I also recommend a balanced diet with fruits and vegetables every day, lean meats, and little fried foods. The DASH diet (you can find this online) is a good example of this.  -     Lipid panel -     CMP14+EGFR -     losartan-hydrochlorothiazide (HYZAAR) 100-12.5 MG tablet; Take 1 tablet by mouth daily.  Hyperlipidemia, unspecified hyperlipidemia type -     atorvastatin (LIPITOR) 40 MG tablet; Take 1 tablet (40 mg total) by mouth daily.    Return in about 6 months (around  08/22/2018)  for hypertension and lipid .    Body mass index is 27.86 kg/m.:  Discussed the patient's BMI with patient. The BMI body mass index is 27.86 kg/m.     No future appointments.  Patient Instructions       If you have lab work done today you will be contacted with your lab results within the next 2 weeks.  If you have not heard from [place] then please contact us. The fastest way to get your results is to register for My Chart.   IF you received an x-ray today, you will receive an invoice from [facility]. Please contact [facility] at [phone] with questions or concerns regarding your invoice.   IF you received labwork today, you will receive an invoice from [facility]. Please contact [facility] at [phone] with questions or concerns regarding your invoice.   Our billing staff will not be able to assist you with questions regarding bills from these companies.  You will be contacted with the lab results as soon as they are available. The fastest way to get your results is to activate your My Chart account. Instructions are located on the last page of this paperwork. If you have not heard from [place] regarding the results in 2 weeks, please contact this office.     Preventive Care 40-64 Years, Female Preventive care refers to lifestyle choices and visits with your health care provider that can promote health and wellness. What does preventive care include?  A yearly physical exam. This is also called an annual well check.  Dental exams once or twice a year.  Routine eye exams. Ask your health care provider how often you should have your eyes checked.  Personal lifestyle choices, including: ? Daily care of your teeth and gums. ? Regular physical activity. ? Eating a healthy diet. ? Avoiding tobacco and drug use. ? Limiting alcohol use. ? Practicing safe sex. ? Taking low-dose aspirin daily starting at age 41. ? Taking vitamin and mineral supplements  as recommended by your health care provider. What happens during an annual well check? The services and screenings done by your health care provider during your annual well check will depend on your age, overall health, lifestyle risk factors, and family history of disease. Counseling Your health care provider may ask you questions about your:  Alcohol use.  Tobacco use.  Drug use.  Emotional well-being.  Home and relationship well-being.  Sexual activity.  Eating habits.  Work and work Statistician.  Method of birth control.  Menstrual cycle.  Pregnancy history.  Screening You may have the following tests or measurements:  Height, weight, and BMI.  Blood pressure.  Lipid and cholesterol levels. These may be checked every 5 years, or more frequently if you are over 74 years old.  Skin check.  Lung cancer screening. You may have this screening every year starting at age 66 if you have a 30-pack-year history of smoking and currently smoke or have quit within the past 15 years.  Fecal occult blood test (FOBT) of the stool. You may have this test every year starting at age 63.  Flexible sigmoidoscopy or colonoscopy. You may have a sigmoidoscopy every 5 years or a colonoscopy every 10 years starting at age 22.  Hepatitis C blood test.  Hepatitis B blood test.  Sexually transmitted disease (STD) testing.  Diabetes screening. This is done by checking your blood sugar (glucose) after you have not eaten for a while (fasting). You may have this done every  1-3 years.  Mammogram. This may be done every 1-2 years. Talk to your health care provider about when you should start having regular mammograms. This may depend on whether you have a family history of breast cancer.  BRCA-related cancer screening. This may be done if you have a family history of breast, ovarian, tubal, or peritoneal cancers.  Pelvic exam and Pap test. This may be done every 3 years starting at age 46.  Starting at age 21, this may be done every 5 years if you have a Pap test in combination with an HPV test.  Bone density scan. This is done to screen for osteoporosis. You may have this scan if you are at high risk for osteoporosis.  Discuss your test results, treatment options, and if necessary, the need for more tests with your health care provider. Vaccines Your health care provider may recommend certain vaccines, such as:  Influenza vaccine. This is recommended every year.  Tetanus, diphtheria, and acellular pertussis (Tdap, Td) vaccine. You may need a Td booster every 10 years.  Varicella vaccine. You may need this if you have not been vaccinated.  Zoster vaccine. You may need this after age 70.  Measles, mumps, and rubella (MMR) vaccine. You may need at least one dose of MMR if you were born in 1957 or later. You may also need a second dose.  Pneumococcal 13-valent conjugate (PCV13) vaccine. You may need this if you have certain conditions and were not previously vaccinated.  Pneumococcal polysaccharide (PPSV23) vaccine. You may need one or two doses if you smoke cigarettes or if you have certain conditions.  Meningococcal vaccine. You may need this if you have certain conditions.  Hepatitis A vaccine. You may need this if you have certain conditions or if you travel or work in places where you may be exposed to hepatitis A.  Hepatitis B vaccine. You may need this if you have certain conditions or if you travel or work in places where you may be exposed to hepatitis B.  Haemophilus influenzae type b (Hib) vaccine. You may need this if you have certain conditions.  Talk to your health care provider about which screenings and vaccines you need and how often you need them. This information is not intended to replace advice given to you by your health care provider. Make sure you discuss any questions you have with your health care provider. Document Released: 04/23/2015 Document  Revised: 12/15/2015 Document Reviewed: 01/26/2015 Elsevier Interactive Patient Education  [employer].

## 2018-02-22 LAB — CMP14+EGFR
A/G RATIO: 2.3 — AB (ref 1.2–2.2)
ALK PHOS: 104 IU/L (ref 39–117)
ALT: 41 IU/L — AB (ref 0–32)
AST: 34 IU/L (ref 0–40)
Albumin: 4.9 g/dL — ABNORMAL HIGH (ref 3.6–4.8)
BILIRUBIN TOTAL: 0.5 mg/dL (ref 0.0–1.2)
BUN / CREAT RATIO: 22 (ref 12–28)
BUN: 18 mg/dL (ref 8–27)
CHLORIDE: 103 mmol/L (ref 96–106)
CO2: 22 mmol/L (ref 20–29)
Calcium: 9.8 mg/dL (ref 8.7–10.3)
Creatinine, Ser: 0.81 mg/dL (ref 0.57–1.00)
GFR calc Af Amer: 89 mL/min/{1.73_m2} (ref >59)
GFR calc non Af Amer: 77 mL/min/{1.73_m2} (ref >59)
GLUCOSE: 101 mg/dL — AB (ref 65–99)
Globulin, Total: 2.1 g/dL (ref 1.5–4.5)
POTASSIUM: 4 mmol/L (ref 3.5–5.2)
Sodium: 145 mmol/L — ABNORMAL HIGH (ref 134–144)
Total Protein: 7 g/dL (ref 6.0–8.5)

## 2018-02-22 LAB — LIPID PANEL
CHOL/HDL RATIO: 3.6 ratio (ref 0.0–4.4)
Cholesterol, Total: 206 mg/dL — ABNORMAL HIGH (ref 100–199)
HDL: 57 mg/dL (ref >39)
LDL Calculated: 108 mg/dL — ABNORMAL HIGH (ref 0–99)
Triglycerides: 207 mg/dL — ABNORMAL HIGH (ref 0–149)
VLDL CHOLESTEROL CAL: 41 mg/dL — AB (ref 5–40)

## 2018-02-26 LAB — PAP IG, CT-NG NAA, HPV HIGH-RISK
Chlamydia, Nuc. Acid Amp: NEGATIVE
Gonococcus by Nucleic Acid Amp: NEGATIVE
HPV, high-risk: NEGATIVE

## 2018-03-16 ENCOUNTER — Other Ambulatory Visit: Payer: Self-pay | Admitting: Family Medicine

## 2018-03-16 DIAGNOSIS — F329 Major depressive disorder, single episode, unspecified: Secondary | ICD-10-CM

## 2018-03-16 DIAGNOSIS — F32A Depression, unspecified: Secondary | ICD-10-CM

## 2018-05-17 ENCOUNTER — Other Ambulatory Visit: Payer: Self-pay | Admitting: Family Medicine

## 2018-05-17 DIAGNOSIS — F329 Major depressive disorder, single episode, unspecified: Secondary | ICD-10-CM

## 2018-05-17 DIAGNOSIS — F32A Depression, unspecified: Secondary | ICD-10-CM

## 2018-08-08 ENCOUNTER — Other Ambulatory Visit: Payer: Self-pay | Admitting: Family Medicine

## 2018-08-08 DIAGNOSIS — F329 Major depressive disorder, single episode, unspecified: Secondary | ICD-10-CM

## 2018-08-08 DIAGNOSIS — F32A Depression, unspecified: Secondary | ICD-10-CM

## 2018-08-23 ENCOUNTER — Ambulatory Visit: Payer: Federal, State, Local not specified - PPO | Admitting: Family Medicine

## 2018-08-23 ENCOUNTER — Encounter: Payer: Self-pay | Admitting: Family Medicine

## 2018-08-23 ENCOUNTER — Other Ambulatory Visit: Payer: Self-pay

## 2018-08-23 VITALS — BP 110/66 | HR 93 | Temp 99.1°F | Ht 66.5 in | Wt 166.2 lb

## 2018-08-23 DIAGNOSIS — R197 Diarrhea, unspecified: Secondary | ICD-10-CM

## 2018-08-23 DIAGNOSIS — A045 Campylobacter enteritis: Secondary | ICD-10-CM | POA: Diagnosis not present

## 2018-08-23 DIAGNOSIS — R5383 Other fatigue: Secondary | ICD-10-CM

## 2018-08-23 DIAGNOSIS — R109 Unspecified abdominal pain: Secondary | ICD-10-CM

## 2018-08-23 MED ORDER — AZITHROMYCIN 500 MG PO TABS: 500.0000 mg | ORAL_TABLET | Freq: Every day | ORAL | 0 refills | Status: DC

## 2018-08-23 NOTE — Progress Notes (Signed)
Subjective:    Patient ID: Michele Lowery, female    DOB: 01/25/1956, 63 y.o.   MRN: [id number]  HPI Michele Lowery is a 63 y.o. female Presents today for: Chief Complaint  Patient presents with  . food posining possibly    Started Monday night. Was very weak a fatigue   . Diarrhea  . Emesis  . Blood In Stools   Started 4 nights ago. Ate dinner (steak and potato from takeout [employer]), started with diffuse abdominal pain about 3 hrs later. Initially had some difficulty with having BM, followed by diarrhea, then bright red blood. T97, but chills. Diarrhea ever hour and a half. vomiting x 4 that night. No recurrence of vomiting. Ice chips initially.   3 days ago - temp 99.5, Persistent abd pain, with diarrhea 3-4 times during day - bloody diarrhea each time. Ice chips only fluid intake.   2 days ago- persistent abd pain. 3 episodes of bloody diarrhea.  Ate some yogurt, ice chips. Diarrhea after yogurt. no fever  Yesterday - dark blood with BM's yesterday - does not see clots - just dark blood. No fever. Ate yogurt, water, weak tea.   Today: Tried flour tortilla with cheese, worse diarrhea, but no apparent blood. Cramps later today - blood with wiping only. No BM. No fever.   No known sick contacts. Dtr. in [place] with Covid 19 in February, no direct contact. No known exposure to covid 19.  No recent abx or hospitalizations.   Fatigue, generalized weakness initial 2 days. Better today.  Pain in abdomen moves around - cramping pain.  No diffuse bodyaches.  HA 2 days ago.  No cough.  No dyspnea.  No change in taste/smell.   Treated for campylobacter infection in 4/19, and similar sx's in October in [place] (testing in April 2019 only).  Pets: inside dog. No chickens/birds or other pets.  Has not prepared meals with raw chicken recently.  Did take hyzaar today - held past few days.   Colonoscopy in 2010 - normal, internal hemorrhoids.  - plans repeat this August.   Patient  Active Problem List   Diagnosis Date Noted  . Belching 01/14/2018  . Epigastric pain 01/14/2018  . Right Achilles tendinitis 01/19/2017  . Stress incontinence 12/23/2014  . Peripheral neuropathy 12/17/2013  . Rosacea   . HLD (hyperlipidemia)   . HTN (hypertension)   . Depression   . ? Hernia of abdominal wall - epigastrium 10/17/2010    Class: Question of  . Heartburn 10/17/2010  . Overweight (BMI 25.0-29.9) 10/17/2010   Past Medical History:  Diagnosis Date  . Anxiety   . Depression   . HLD (hyperlipidemia)   . HTN (hypertension)   . Hx: UTI (urinary tract infection)   . Internal hemorrhoids 11/13/08  . Migraine headache 05/07/2011   h/o atypical migraines  . Rosacea   . Vertigo    Past Surgical History:  Procedure Laterality Date  . COLONOSCOPY  11/13/2008   internal hemrrhoids   Allergies  Allergen Reactions  . Amlodipine   . Desipramine   . Doxycycline Nausea And Vomiting    REACTION: nausea and vomiting  . Lisinopril Cough  . Sulfonamide Derivatives     REACTION: "i can't remember the reaction"   Prior to Admission medications   Medication Sig Start Date End Date Taking? Authorizing Provider  aspirin 81 MG tablet Take 81 mg by mouth daily.     Yes [provider]  atorvastatin (LIPITOR)  40 MG tablet Take 1 tablet (40 mg total) by mouth daily. 02/21/18  Yes Delia Chimes A, MD  b complex vitamins tablet Take 1 tablet by mouth daily.   Yes [provider]  cholecalciferol (VITAMIN D) 400 UNITS TABS tablet Take 400 Units by mouth.   Yes [provider]  FLUoxetine (PROZAC) 20 MG tablet TAKE 1 TABLET BY MOUTH EVERY DAY 08/08/18  Yes Wendie Agreste, MD  losartan-hydrochlorothiazide (HYZAAR) 100-12.5 MG tablet Take 1 tablet by mouth daily. 02/21/18  Yes Forrest Moron, MD  Omega-3 Fatty Acids (FISH OIL PO) Take 4 tablets by mouth daily.    Yes [provider]  pantoprazole (PROTONIX) 40 MG tablet Take 1 tablet (40 mg total) by  mouth daily. Take before dinner 01/03/18  Yes Zehr, Laban Emperor, PA-C   Social History   Socioeconomic History  . Marital status: Married    Spouse name: Not on file  . Number of children: 2  . Years of education: Not on file  . Highest education level: Not on file  Occupational History  . Occupation: Control and instrumentation engineer  Social Needs  . Financial resource strain: Not on file  . Food insecurity:    Worry: Not on file    Inability: Not on file  . Transportation needs:    Medical: Not on file    Non-medical: Not on file  Tobacco Use  . Smoking status: Former Smoker    Packs/day: 0.50    Years: 4.00    Pack years: 2.00    Types: Cigarettes    Last attempt to quit: 07/09/1976    Years since quitting: 42.1  . Smokeless tobacco: Never Used  Substance and Sexual Activity  . Alcohol use: Yes    Alcohol/week: 1.0 standard drinks    Types: 1 Standard drinks or equivalent per week    Comment: 1-2 per month   . Drug use: No  . Sexual activity: Never    Birth control/protection: Abstinence  Lifestyle  . Physical activity:    Days per week: Not on file    Minutes per session: Not on file  . Stress: Not on file  Relationships  . Social connections:    Talks on phone: Not on file    Gets together: Not on file    Attends religious service: Not on file    Active member of club or organization: Not on file    Attends meetings of clubs or organizations: Not on file    Relationship status: Not on file  . Intimate partner violence:    Fear of current or ex partner: Not on file    Emotionally abused: Not on file    Physically abused: Not on file    Forced sexual activity: Not on file  Other Topics Concern  . Not on file  Social History Narrative   1 caffeine drink daily     Review of Systems     Objective:   Physical Exam Vitals signs reviewed.  Constitutional:      Appearance: She is well-developed.  HENT:     Head: Normocephalic and atraumatic.  Eyes:      Conjunctiva/sclera: Conjunctivae normal.     Pupils: Pupils are equal, round, and reactive to light.  Neck:     Vascular: No carotid bruit.  Cardiovascular:     Rate and Rhythm: Normal rate and regular rhythm.     Heart sounds: Normal heart sounds. No murmur.  Pulmonary:  Effort: Pulmonary effort is normal.     Breath sounds: Normal breath sounds.  Abdominal:     General: Abdomen is flat.     Palpations: Abdomen is soft. There is no pulsatile mass.     Tenderness: There is abdominal tenderness.  Skin:    General: Skin is warm and dry.  Neurological:     Mental Status: She is alert and oriented to person, place, and time.  Psychiatric:        Behavior: Behavior normal.    Vitals:   08/23/18 1550  BP: 110/66  Pulse: 93  Temp: 99.1 F (37.3 C)  TempSrc: Oral  SpO2: 94%  Weight: 166 lb 3.2 oz (75.4 kg)  Height: 5' 6.5" (1.689 m)  No data found.      Assessment & Plan:    Michele Lowery is a 63 y.o. female Campylobacter intestinal infection - Plan: Stool culture, azithromycin (ZITHROMAX) 500 MG tablet  Bloody diarrhea - Plan: Basic metabolic panel, CBC, Stool culture  Abdominal cramping  Fatigue, unspecified type - Plan: Basic metabolic panel  Bloody diarrhea with likely fatigue from volume depletion.  Now improving.  Foodborne illness possible, with prior Campylobacter infection, possible recurrence.  No true fever, and with symptom improvement, option of azithromycin was discussed, but if continue to improve can placed on hold.  Antibiotic was prescribed if any worsening symptoms.  Stool culture obtained, CBC obtained, continue oral rehydration therapy with ER precautions, recheck 3 days.  Meds ordered this encounter  Medications  . azithromycin (ZITHROMAX) 500 MG tablet    Sig: Take 1 tablet (500 mg total) by mouth daily.    Dispense:  3 tablet    Refill:  0   Patient Instructions  I am suspicious of Campylobacter infection again, but as that is somewhat  improving today, may not need to start azithromycin.  I have sent that to your pharmacy, so if the abdominal pain is not improving worsening diarrhea, or return of fever symptoms, start azithromycin right away.  I do recommend calling gastroenterology for follow-up, but will also follow-up with you on Monday.  If any worsening symptoms over the weekend including lightheadedness, dizziness or worsening bleeding, be seen in the emergency room.   Small sips of fluids frequently for now, and slowly start bland solids once symptoms have continued to improve.   Bloody Diarrhea Bloody diarrhea is frequent loose and watery bowel movements that contain blood. The blood can be hard to see or notice (occult). Bloody diarrhea may be caused by medical conditions such as:  Ulcerative colitis.  Crohn's disease.  Intestinal infection.  Viral gastroenteritis or bacterial gastroenteritis. Finding out why there is blood in your diarrhea is necessary so that your health care provider can prescribe the right treatment for you. Follow the instructions from your health care provider about treating the cause of your bloody diarrhea. Any type of diarrhea can make you feel weak and dehydrated. Dehydration can make you tired and thirsty, cause you to have a dry mouth, and decrease how often you urinate. Follow these instructions at home: Eating and drinking     Follow these recommendations as told by your health care provider:  Take an oral rehydration solution (ORS). This is an over-the-counter medicine that helps return your body to its normal balance of nutrients and water. It is found at pharmacies and retail stores.  Drink enough fluid to keep your urine pale yellow. ? Drink fluids such as water, ice chips, diluted fruit juice,  and low-calorie sports drinks. You can also drink milk products, if desired. ? Avoid drinking fluids that contain a lot of sugar or caffeine, such as energy drinks, regular sports  drinks, and soda. ? Avoid alcohol.  Eat bland, easy-to-digest foods in small amounts as you are able. These foods include bananas, applesauce, rice, lean meats, toast, and crackers.  Avoid spicy or fatty foods.  Medicines  Take over-the-counter and prescription medicines only as told by your health care provider. ? Your health care provider may prescribe medicine to slow down the frequency of diarrhea or to ease stomach discomfort.  If you were prescribed an antibiotic medicine, take it as told by your health care provider. Do not stop using the antibiotic even if you start to feel better. General instructions   Wash your hands often using soap and water. If soap and water are not available, use a hand sanitizer. Others in the household should wash their hands as well. Hands should be washed: ? After using the toilet or changing a diaper. ? Before preparing, cooking, or serving food. ? While caring for a sick person or while visiting someone in a hospital.  Rest at home while you recover.  Take a warm bath to relieve any burning or pain from frequent diarrhea episodes.  Watch your condition for any changes.  Keep all follow-up visits as told by your health care provider. This is important. Contact a health care provider if:  You have a fever.  Your diarrhea gets worse.  You have new symptoms.  You cannot keep fluids down.  You feel light-headed or dizzy.  You have a headache.  You have muscle cramps. Get help right away if:  You have chest pain.  You feel extremely weak or you faint.  The blood in your diarrhea increases or turns a different color.  You vomit and the vomit is bloody or looks black.  You have persistent diarrhea.  You have severe pain, cramping, or bloating in your abdomen.  You have trouble breathing or you are breathing very quickly.  Your heart is beating very quickly.  Your skin feels cold and clammy.  You feel confused.  You have  signs of dehydration, such as: ? Dark urine, very little urine, or no urine. ? Cracked lips. ? Dry mouth. ? Sunken eyes. ? Sleepiness. ? Weakness. Summary  Bloody diarrhea is frequent loose and watery bowel movements that contain blood. The blood can be hard to see or notice (occult).  Follow the instructions from your health care provider about treating the cause of your bloody diarrhea.  Any type of diarrhea can make you feel weak and dehydrated.  Follow your health care provider's recommendations for eating and drinking and for taking medicines.  Contact your health care provider if your symptoms get worse. Get help right away if you have signs of dehydration. This information is not intended to replace advice given to you by your health care provider. Make sure you discuss any questions you have with your health care provider. Document Released: 03/27/2005 Document Revised: 09/06/2017 Document Reviewed: 09/06/2017 Elsevier Interactive Patient Education  2019 [employer],   [name], MD [facility].  08/26/18 10:30 AM

## 2018-08-23 NOTE — Patient Instructions (Addendum)
I am suspicious of Campylobacter infection again, but as that is somewhat improving today, may not need to start azithromycin.  I have sent that to your pharmacy, so if the abdominal pain is not improving worsening diarrhea, or return of fever symptoms, start azithromycin right away.  I do recommend calling gastroenterology for follow-up, but will also follow-up with you on Monday.  If any worsening symptoms over the weekend including lightheadedness, dizziness or worsening bleeding, be seen in the emergency room.   Small sips of fluids frequently for now, and slowly start bland solids once symptoms have continued to improve.   Bloody Diarrhea Bloody diarrhea is frequent loose and watery bowel movements that contain blood. The blood can be hard to see or notice (occult). Bloody diarrhea may be caused by medical conditions such as:  Ulcerative colitis.  Crohn's disease.  Intestinal infection.  Viral gastroenteritis or bacterial gastroenteritis. Finding out why there is blood in your diarrhea is necessary so that your health care provider can prescribe the right treatment for you. Follow the instructions from your health care provider about treating the cause of your bloody diarrhea. Any type of diarrhea can make you feel weak and dehydrated. Dehydration can make you tired and thirsty, cause you to have a dry mouth, and decrease how often you urinate. Follow these instructions at home: Eating and drinking     Follow these recommendations as told by your health care provider:  Take an oral rehydration solution (ORS). This is an over-the-counter medicine that helps return your body to its normal balance of nutrients and water. It is found at pharmacies and retail stores.  Drink enough fluid to keep your urine pale yellow. ? Drink fluids such as water, ice chips, diluted fruit juice, and low-calorie sports drinks. You can also drink milk products, if desired. ? Avoid drinking fluids that  contain a lot of sugar or caffeine, such as energy drinks, regular sports drinks, and soda. ? Avoid alcohol.  Eat bland, easy-to-digest foods in small amounts as you are able. These foods include bananas, applesauce, rice, lean meats, toast, and crackers.  Avoid spicy or fatty foods.  Medicines  Take over-the-counter and prescription medicines only as told by your health care provider. ? Your health care provider may prescribe medicine to slow down the frequency of diarrhea or to ease stomach discomfort.  If you were prescribed an antibiotic medicine, take it as told by your health care provider. Do not stop using the antibiotic even if you start to feel better. General instructions   Wash your hands often using soap and water. If soap and water are not available, use a hand sanitizer. Others in the household should wash their hands as well. Hands should be washed: ? After using the toilet or changing a diaper. ? Before preparing, cooking, or serving food. ? While caring for a sick person or while visiting someone in a hospital.  Rest at home while you recover.  Take a warm bath to relieve any burning or pain from frequent diarrhea episodes.  Watch your condition for any changes.  Keep all follow-up visits as told by your health care provider. This is important. Contact a health care provider if:  You have a fever.  Your diarrhea gets worse.  You have new symptoms.  You cannot keep fluids down.  You feel light-headed or dizzy.  You have a headache.  You have muscle cramps. Get help right away if:  You have chest pain.  You feel  extremely weak or you faint.  The blood in your diarrhea increases or turns a different color.  You vomit and the vomit is bloody or looks black.  You have persistent diarrhea.  You have severe pain, cramping, or bloating in your abdomen.  You have trouble breathing or you are breathing very quickly.  Your heart is beating very  quickly.  Your skin feels cold and clammy.  You feel confused.  You have signs of dehydration, such as: ? Dark urine, very little urine, or no urine. ? Cracked lips. ? Dry mouth. ? Sunken eyes. ? Sleepiness. ? Weakness. Summary  Bloody diarrhea is frequent loose and watery bowel movements that contain blood. The blood can be hard to see or notice (occult).  Follow the instructions from your health care provider about treating the cause of your bloody diarrhea.  Any type of diarrhea can make you feel weak and dehydrated.  Follow your health care provider's recommendations for eating and drinking and for taking medicines.  Contact your health care provider if your symptoms get worse. Get help right away if you have signs of dehydration. This information is not intended to replace advice given to you by your health care provider. Make sure you discuss any questions you have with your health care provider. Document Released: 03/27/2005 Document Revised: 09/06/2017 Document Reviewed: 09/06/2017 Elsevier Interactive Patient Education  2019 [employer].

## 2018-08-24 LAB — BASIC METABOLIC PANEL
BUN/Creatinine Ratio: 18 (ref 12–28)
BUN: 16 mg/dL (ref 8–27)
CO2: 23 mmol/L (ref 20–29)
Calcium: 9.7 mg/dL (ref 8.7–10.3)
Chloride: 101 mmol/L (ref 96–106)
Creatinine, Ser: 0.87 mg/dL (ref 0.57–1.00)
GFR calc Af Amer: 81 mL/min/{1.73_m2} (ref >59)
GFR calc non Af Amer: 71 mL/min/{1.73_m2} (ref >59)
Glucose: 105 mg/dL — ABNORMAL HIGH (ref 65–99)
Potassium: 3.8 mmol/L (ref 3.5–5.2)
Sodium: 140 mmol/L (ref 134–144)

## 2018-08-24 LAB — CBC
Hematocrit: 40.1 % (ref 34.0–46.6)
Hemoglobin: 13.5 g/dL (ref 11.1–15.9)
MCH: 29.3 pg (ref 26.6–33.0)
MCHC: 33.7 g/dL (ref 31.5–35.7)
MCV: 87 fL (ref 79–97)
Platelets: 396 10*3/uL (ref 150–450)
RBC: 4.61 x10E6/uL (ref 3.77–5.28)
RDW: 12.5 % (ref 11.7–15.4)
WBC: 11.4 10*3/uL — ABNORMAL HIGH (ref 3.4–10.8)

## 2018-08-26 ENCOUNTER — Encounter: Payer: Self-pay | Admitting: Family Medicine

## 2018-08-26 ENCOUNTER — Other Ambulatory Visit: Payer: Self-pay

## 2018-08-26 ENCOUNTER — Telehealth (INDEPENDENT_AMBULATORY_CARE_PROVIDER_SITE_OTHER): Payer: Federal, State, Local not specified - PPO | Admitting: Family Medicine

## 2018-08-26 DIAGNOSIS — R197 Diarrhea, unspecified: Secondary | ICD-10-CM | POA: Diagnosis not present

## 2018-08-26 DIAGNOSIS — R109 Unspecified abdominal pain: Secondary | ICD-10-CM

## 2018-08-26 MED ORDER — HYOSCYAMINE SULFATE 0.125 MG SL SUBL: 0.1250 mg | SUBLINGUAL_TABLET | SUBLINGUAL | 0 refills | Status: DC | PRN

## 2018-08-26 NOTE — Progress Notes (Signed)
Virtual Visit via Telephone Note  I connected with Michele Lowery on 08/26/18 at 10:29 AM by telephone and verified that I am speaking with the correct person using two identifiers.   I discussed the limitations, risks, security and privacy concerns of performing an evaluation and management service by telephone and the availability of in person appointments. I also discussed with the patient that there may be a patient responsible charge related to this service. The patient expressed understanding and agreed to proceed, consent obtained  Chief complaint: Follow-up bloody diarrhea  History of Present Illness: Michele Lowery is a 63 y.o. female   Evaluated 3 days ago for recent bloody diarrhea, starting 4 nights prior after eating food from restaurant takeout. possible foodborne illness, no other sick individuals at home.  Previous Campylobacter infection in April 2019, possible recurrence in October.  Had been followed by gastroenterology with probiotics previously.  Symptoms were improving when seen in office, symptomatic care discussed, azithromycin prescribed if not continue to improve.  Stool culture below, no Salmonella, Shigella, and negative E. coli.  Campylobacter still pending.  Has not had BM since 3 days ago. Still drinking fluids,tried crackers, rice, small amount of banana bread. Still some cramping in stomach - same as Friday. Some bloating.  No fever.  No bleeding. Did not fill antibiotic yet.    Results for orders placed or performed in visit on 08/23/18  Stool culture  Result Value Ref Range   Salmonella/Shigella Screen Final report    Stool Culture result 1 (RSASHR) Comment    Campylobacter Culture Preliminary report    Stool Culture result 1 (CMPCXR) Comment    E coli, Shiga toxin Assay Negative Negative  Basic metabolic panel  Result Value Ref Range   Glucose 105 (H) 65 - 99 mg/dL   BUN 16 8 - 27 mg/dL   Creatinine, Ser 0.87 0.57 - 1.00 mg/dL   GFR calc  non Af Amer 71 >59 mL/min/1.73   GFR calc Af Amer 81 >59 mL/min/1.73   BUN/Creatinine Ratio 18 12 - 28   Sodium 140 134 - 144 mmol/L   Potassium 3.8 3.5 - 5.2 mmol/L   Chloride 101 96 - 106 mmol/L   CO2 23 20 - 29 mmol/L   Calcium 9.7 8.7 - 10.3 mg/dL  CBC  Result Value Ref Range   WBC 11.4 (H) 3.4 - 10.8 x10E3/uL   RBC 4.61 3.77 - 5.28 x10E6/uL   Hemoglobin 13.5 11.1 - 15.9 g/dL   Hematocrit 40.1 34.0 - 46.6 %   MCV 87 79 - 97 fL   MCH 29.3 26.6 - 33.0 pg   MCHC 33.7 31.5 - 35.7 g/dL   RDW 12.5 11.7 - 15.4 %   Platelets 396 150 - 450 x10E3/uL        Patient Active Problem List   Diagnosis Date Noted  . Belching 01/14/2018  . Epigastric pain 01/14/2018  . Right Achilles tendinitis 01/19/2017  . Stress incontinence 12/23/2014  . Peripheral neuropathy 12/17/2013  . Rosacea   . HLD (hyperlipidemia)   . HTN (hypertension)   . Depression   . ? Hernia of abdominal wall - epigastrium 10/17/2010    Class: Question of  . Heartburn 10/17/2010  . Overweight (BMI 25.0-29.9) 10/17/2010   Past Medical History:  Diagnosis Date  . Anxiety   . Depression   . HLD (hyperlipidemia)   . HTN (hypertension)   . Hx: UTI (urinary tract infection)   . Internal hemorrhoids 11/13/08  .  Migraine headache 05/07/2011   h/o atypical migraines  . Rosacea   . Vertigo    Past Surgical History:  Procedure Laterality Date  . COLONOSCOPY  11/13/2008   internal hemrrhoids   Allergies  Allergen Reactions  . Amlodipine   . Desipramine   . Doxycycline Nausea And Vomiting    REACTION: nausea and vomiting  . Lisinopril Cough  . Sulfonamide Derivatives     REACTION: "i can't remember the reaction"   Prior to Admission medications   Medication Sig Start Date End Date Taking? Authorizing Provider  aspirin 81 MG tablet Take 81 mg by mouth daily.     Yes [provider]  atorvastatin (LIPITOR) 40 MG tablet Take 1 tablet (40 mg total) by mouth daily. 02/21/18  Yes Delia Chimes A, MD  b  complex vitamins tablet Take 1 tablet by mouth daily.   Yes [provider]  cholecalciferol (VITAMIN D) 400 UNITS TABS tablet Take 400 Units by mouth.   Yes [provider]  FLUoxetine (PROZAC) 20 MG tablet TAKE 1 TABLET BY MOUTH EVERY DAY 08/08/18  Yes Wendie Agreste, MD  losartan-hydrochlorothiazide (HYZAAR) 100-12.5 MG tablet Take 1 tablet by mouth daily. 02/21/18  Yes Forrest Moron, MD  Omega-3 Fatty Acids (FISH OIL PO) Take 4 tablets by mouth daily.    Yes [provider]  pantoprazole (PROTONIX) 40 MG tablet Take 1 tablet (40 mg total) by mouth daily. Take before dinner 01/03/18  Yes Zehr, Janett Billow D, PA-C  azithromycin (ZITHROMAX) 500 MG tablet Take 1 tablet (500 mg total) by mouth daily. Patient not taking: Reported on 08/26/2018 08/23/18   Wendie Agreste, MD   Social History   Socioeconomic History  . Marital status: Married    Spouse name: Not on file  . Number of children: 2  . Years of education: Not on file  . Highest education level: Not on file  Occupational History  . Occupation: Control and instrumentation engineer  Social Needs  . Financial resource strain: Not on file  . Food insecurity:    Worry: Not on file    Inability: Not on file  . Transportation needs:    Medical: Not on file    Non-medical: Not on file  Tobacco Use  . Smoking status: Former Smoker    Packs/day: 0.50    Years: 4.00    Pack years: 2.00    Types: Cigarettes    Last attempt to quit: 07/09/1976    Years since quitting: 42.1  . Smokeless tobacco: Never Used  Substance and Sexual Activity  . Alcohol use: Yes    Alcohol/week: 1.0 standard drinks    Types: 1 Standard drinks or equivalent per week    Comment: 1-2 per month   . Drug use: No  . Sexual activity: Never    Birth control/protection: Abstinence  Lifestyle  . Physical activity:    Days per week: Not on file    Minutes per session: Not on file  . Stress: Not on file  Relationships  . Social connections:     Talks on phone: Not on file    Gets together: Not on file    Attends religious service: Not on file    Active member of club or organization: Not on file    Attends meetings of clubs or organizations: Not on file    Relationship status: Not on file  . Intimate partner violence:    Fear of current or ex partner: Not on file  Emotionally abused: Not on file    Physically abused: Not on file    Forced sexual activity: Not on file  Other Topics Concern  . Not on file  Social History Narrative   1 caffeine drink daily      Observations/Objective: No distress.  Advised to palpate abdomen - denies pain with pressure.   Assessment and Plan: Bloody diarrhea - Plan: Ambulatory referral to Gastroenterology  Abdominal cramping - Plan: Ambulatory referral to Gastroenterology, hyoscyamine (LEVSIN SL) 0.125 MG SL tablet Infectious diarrhea with possible campylobacter, but cx still in progress. improved symptoms. Less likely colitis/diverticulitis or diverticular bleed - bleeding resolved.   - bismuth if needed, episodic levsin if needed.   -still holding on azithro as improved sx's.   - refer to GI.   - if any interval worsening - repeat cbc, consider C diff testing, and possible CT abdomen.   Follow Up Instructions:  2 days.  Patient Instructions      If you have lab work done today you will be contacted with your lab results within the next 2 weeks.  If you have not heard from [place] then please contact us. The fastest way to get your results is to register for My Chart.   IF you received an x-ray today, you will receive an invoice from [facility]. Please contact [facility] at [phone] with questions or concerns regarding your invoice.   IF you received labwork today, you will receive an invoice from [facility]. Please contact [facility] at [phone] with questions or concerns regarding your invoice.   Our billing staff will not be able to assist you with  questions regarding bills from these companies.  You will be contacted with the lab results as soon as they are available. The fastest way to get your results is to activate your My Chart account. Instructions are located on the last page of this paperwork. If you have not heard from [place] regarding the results in 2 weeks, please contact this office.         I discussed the assessment and treatment plan with the patient. The patient was provided an opportunity to ask questions and all were answered. The patient agreed with the plan and demonstrated an understanding of the instructions.   The patient was advised to call back or seek an in-person evaluation if the symptoms worsen or if the condition fails to improve as anticipated.  I provided 17 minutes of non-face-to-face time during this encounter.  Signed,   [name], MD [facility] [place].  08/26/18

## 2018-08-26 NOTE — Progress Notes (Signed)
CC: 3 day recheck on blood stools(diarrhea).  Per pt she is better no bm since Friday.  Pt advises she didn't start the azithromycin.  Per pt waiting for lab results also.  No travel outside the [place] or [place] in the past 3 weeks.  No recent weight or bp taken.

## 2018-08-26 NOTE — Patient Instructions (Addendum)
° ° ° °  If you have lab work done today you will be contacted with your lab results within the next 2 weeks.  If you have not heard from us then please contact us. The fastest way to get your results is to register for My Chart. ° ° °IF you received an x-ray today, you will receive an invoice from [facility]. Please contact [facility] at [phone] with questions or concerns regarding your invoice.  ° °IF you received labwork today, you will receive an invoice from [facility]. Please contact [facility] at [phone] with questions or concerns regarding your invoice.  ° °Our billing staff will not be able to assist you with questions regarding bills from these companies. ° °You will be contacted with the lab results as soon as they are available. The fastest way to get your results is to activate your My Chart account. Instructions are located on the last page of this paperwork. If you have not heard from us regarding the results in 2 weeks, please contact this office. °  ° ° ° °

## 2018-08-27 ENCOUNTER — Encounter: Payer: Self-pay | Admitting: Family Medicine

## 2018-08-27 ENCOUNTER — Telehealth: Payer: Self-pay | Admitting: Family Medicine

## 2018-08-27 LAB — STOOL CULTURE: E coli, Shiga toxin Assay: NEGATIVE

## 2018-08-27 NOTE — Telephone Encounter (Signed)
Copied from [place] [phone]. Topic: General - Other >> Aug 27, 2018  2:15 PM Rainey Pines A wrote: Patient would like a callback from Dr. Carlota Raspberry nurse in regards to her lab results.

## 2018-08-27 NOTE — Telephone Encounter (Signed)
Copied from [place] [phone]. Topic: General - Other >> Aug 27, 2018  2:15 PM Rainey Pines A wrote: Patient would like a callback from Dr. Carlota Raspberry nurse in regards to her lab results.

## 2018-08-28 ENCOUNTER — Ambulatory Visit: Payer: Self-pay | Admitting: Family Medicine

## 2018-09-03 ENCOUNTER — Other Ambulatory Visit: Payer: Self-pay | Admitting: Family Medicine

## 2018-09-03 DIAGNOSIS — F32A Depression, unspecified: Secondary | ICD-10-CM

## 2018-09-03 DIAGNOSIS — F329 Major depressive disorder, single episode, unspecified: Secondary | ICD-10-CM

## 2018-09-03 NOTE — Telephone Encounter (Signed)
Requested medication (s) are due for refill today:   Yes  Requested medication (s) are on the active medication list:   Yes  Future visit scheduled:   No   (sees Dr. Carlota Raspberry)   Last ordered: 08/08/2018  #30  0 refils.    Returned because 30 day courtesy refill has been given.    Pharmacy needing a Dx code.   Requested Prescriptions  Pending Prescriptions Disp Refills   FLUoxetine (PROZAC) 20 MG tablet [Pharmacy Med Name: FLUOXETINE HCL 20 MG TABLET] 90 tablet 1    Sig: TAKE 1 TABLET BY MOUTH EVERY DAY     Psychiatry:  Antidepressants - SSRI Passed - 09/03/2018  9:38 AM      Passed - Completed PHQ-2 or PHQ-9 in the last 360 days.      Passed - Valid encounter within last 6 months    Recent Outpatient Visits          1 week ago Campylobacter intestinal infection   [facility], Ranell Patrick, MD   6 months ago Encounter for health maintenance examination   [facility] Care at [facility], Arlie Solomons, MD   1 year ago Diarrhea, unspecified type   [facility], Ranell Patrick, MD   1 year ago Acute URI   [facility], [place]   1 year ago Encounter for medication management   Primary Care at [facility], Gelene Mink, [place]

## 2018-09-04 NOTE — Telephone Encounter (Signed)
I talked with pt. Pt states that she called her gastroenterology and their office is not seeing any patients at this time.  Pt states that she is eating and feeling much better. Pt states that she will f/u with her gastroenterology when they are seeing patients again.

## 2018-09-12 DIAGNOSIS — L814 Other melanin hyperpigmentation: Secondary | ICD-10-CM | POA: Diagnosis not present

## 2018-09-12 DIAGNOSIS — D225 Melanocytic nevi of trunk: Secondary | ICD-10-CM | POA: Diagnosis not present

## 2018-09-12 DIAGNOSIS — L820 Inflamed seborrheic keratosis: Secondary | ICD-10-CM | POA: Diagnosis not present

## 2018-09-12 DIAGNOSIS — D2372 Other benign neoplasm of skin of left lower limb, including hip: Secondary | ICD-10-CM | POA: Diagnosis not present

## 2018-09-12 DIAGNOSIS — L821 Other seborrheic keratosis: Secondary | ICD-10-CM | POA: Diagnosis not present

## 2018-09-12 DIAGNOSIS — D1801 Hemangioma of skin and subcutaneous tissue: Secondary | ICD-10-CM | POA: Diagnosis not present

## 2018-09-17 ENCOUNTER — Encounter: Payer: Self-pay | Admitting: Family Medicine

## 2018-09-17 DIAGNOSIS — D2372 Other benign neoplasm of skin of left lower limb, including hip: Secondary | ICD-10-CM | POA: Diagnosis not present

## 2018-11-07 ENCOUNTER — Encounter: Payer: Self-pay | Admitting: Internal Medicine

## 2019-02-10 ENCOUNTER — Encounter: Payer: Self-pay | Admitting: Internal Medicine

## 2019-02-17 ENCOUNTER — Other Ambulatory Visit: Payer: Self-pay | Admitting: Family Medicine

## 2019-02-17 DIAGNOSIS — F329 Major depressive disorder, single episode, unspecified: Secondary | ICD-10-CM

## 2019-02-17 DIAGNOSIS — F32A Depression, unspecified: Secondary | ICD-10-CM

## 2019-03-12 ENCOUNTER — Ambulatory Visit: Payer: Federal, State, Local not specified - PPO | Admitting: *Deleted

## 2019-03-12 ENCOUNTER — Other Ambulatory Visit: Payer: Self-pay

## 2019-03-12 VITALS — Temp 96.4°F | Ht 66.0 in | Wt 164.0 lb

## 2019-03-12 DIAGNOSIS — Z1159 Encounter for screening for other viral diseases: Secondary | ICD-10-CM

## 2019-03-12 DIAGNOSIS — Z1211 Encounter for screening for malignant neoplasm of colon: Secondary | ICD-10-CM

## 2019-03-12 NOTE — Progress Notes (Signed)

## 2019-03-19 ENCOUNTER — Other Ambulatory Visit: Payer: Self-pay | Admitting: Internal Medicine

## 2019-03-19 ENCOUNTER — Ambulatory Visit (INDEPENDENT_AMBULATORY_CARE_PROVIDER_SITE_OTHER): Payer: Medicare Other

## 2019-03-19 DIAGNOSIS — Z1159 Encounter for screening for other viral diseases: Secondary | ICD-10-CM

## 2019-03-19 LAB — SARS CORONAVIRUS 2 (TAT 6-24 HRS): SARS Coronavirus 2: NEGATIVE

## 2019-03-24 ENCOUNTER — Encounter: Payer: Self-pay | Admitting: Internal Medicine

## 2019-03-24 ENCOUNTER — Other Ambulatory Visit: Payer: Self-pay

## 2019-03-24 ENCOUNTER — Ambulatory Visit (AMBULATORY_SURGERY_CENTER): Payer: Medicare Other | Admitting: Internal Medicine

## 2019-03-24 VITALS — BP 120/48 | HR 75 | Temp 98.5°F | Resp 15 | Ht 66.5 in | Wt 164.0 lb

## 2019-03-24 DIAGNOSIS — Z1211 Encounter for screening for malignant neoplasm of colon: Secondary | ICD-10-CM | POA: Diagnosis not present

## 2019-03-24 MED ORDER — SODIUM CHLORIDE 0.9 % IV SOLN: 500.0000 mL | Freq: Once | INTRAVENOUS | Status: DC

## 2019-03-24 NOTE — Patient Instructions (Addendum)
The colonoscopy was normal again!  Next routine colonoscopy or other screening test in 10 years - 2030  As we discussed consider Beano to help with gas and belching.  High fiber foods and cruciferous vegetables, while health, may promote belching and gas.   I appreciate the opportunity to care for you. Gatha Mayer, MD, [facility]   Discharge instructions given. Normal exam. Resume previous medications. YOU HAD AN ENDOSCOPIC PROCEDURE TODAY AT [facility]:   Refer to the procedure report that was given to you for any specific questions about what was found during the examination.  If the procedure report does not answer your questions, please call your gastroenterologist to clarify.  If you requested that your care partner not be given the details of your procedure findings, then the procedure report has been included in a sealed envelope for you to review at your convenience later.  YOU SHOULD EXPECT: Some feelings of bloating in the abdomen. Passage of more gas than usual.  Walking can help get rid of the air that was put into your GI tract during the procedure and reduce the bloating. If you had a lower endoscopy (such as a colonoscopy or flexible sigmoidoscopy) you may notice spotting of blood in your stool or on the toilet paper. If you underwent a bowel prep for your procedure, you may not have a normal bowel movement for a few days.  Please Note:  You might notice some irritation and congestion in your nose or some drainage.  This is from the oxygen used during your procedure.  There is no need for concern and it should clear up in a day or so.  SYMPTOMS TO REPORT IMMEDIATELY:   Following lower endoscopy (colonoscopy or flexible sigmoidoscopy):  Excessive amounts of blood in the stool  Significant tenderness or worsening of abdominal pains  Swelling of the abdomen that is new, acute  Fever of 100F or higher   For urgent or emergent issues, a gastroenterologist  can be reached at any hour by calling [phone].   DIET:  We do recommend a small meal at first, but then you may proceed to your regular diet.  Drink plenty of fluids but you should avoid alcoholic beverages for 24 hours.  ACTIVITY:  You should plan to take it easy for the rest of today and you should NOT DRIVE or use heavy machinery until tomorrow (because of the sedation medicines used during the test).    FOLLOW UP: Our staff will call the number listed on your records 48-72 hours following your procedure to check on you and address any questions or concerns that you may have regarding the information given to you following your procedure. If we do not reach you, we will leave a message.  We will attempt to reach you two times.  During this call, we will ask if you have developed any symptoms of COVID 19. If you develop any symptoms (ie: fever, flu-like symptoms, shortness of breath, cough etc.) before then, please call [phone].  If you test positive for Covid 19 in the 2 weeks post procedure, please call and report this information to [place].    If any biopsies were taken you will be contacted by phone or by letter within the next 1-3 weeks.  Please call us at [phone] if you have not heard about the biopsies in 3 weeks.    SIGNATURES/CONFIDENTIALITY: You and/or your care partner have signed paperwork which will be entered into your  electronic medical record.  These signatures attest to the fact that that the information above on your After Visit Summary has been reviewed and is understood.  Full responsibility of the confidentiality of this discharge information lies with you and/or your care-partner.

## 2019-03-24 NOTE — Op Note (Addendum)
[place] Patient Name: Kynslie Calitri Procedure Date: 03/24/2019 8:00 AM MRN: [id number] Endoscopist: Gatha Mayer , MD Age: 63 Referring MD:  Date of Birth: 1955-04-23 Gender: Female Account #: [id number] Procedure:                Colonoscopy Indications:              Screening for colorectal malignant neoplasm, Last                            colonoscopy: 2010 Medicines:                Propofol per Anesthesia, Monitored Anesthesia Care Procedure:                Pre-Anesthesia Assessment:                           - Prior to the procedure, a History and Physical                            was performed, and patient medications and                            allergies were reviewed. The patient's tolerance of                            previous anesthesia was also reviewed. The risks                            and benefits of the procedure and the sedation                            options and risks were discussed with the patient.                            All questions were answered, and informed consent                            was obtained. Prior Anticoagulants: The patient has                            taken no previous anticoagulant or antiplatelet                            agents. ASA Grade Assessment: II - A patient with                            mild systemic disease. After reviewing the risks                            and benefits, the patient was deemed in                            satisfactory condition to undergo the procedure.  After obtaining informed consent, the colonoscope                            was passed under direct vision. Throughout the                            procedure, the patient's blood pressure, pulse, and                            oxygen saturations were monitored continuously. The                            Colonoscope was introduced through the anus and                            advanced to the  the terminal ileum, with                            identification of the appendiceal orifice and IC                            valve. The colonoscopy was somewhat difficult due                            to significant looping. Successful completion of                            the procedure was aided by applying abdominal                            pressure. The patient tolerated the procedure well.                            The quality of the bowel preparation was good. The                            ileocecal valve, appendiceal orifice, and rectum                            were photographed. Scope In: 8:06:11 AM Scope Out: 8:27:22 AM Scope Withdrawal Time: 0 hours 13 minutes 41 seconds  Total Procedure Duration: 0 hours 21 minutes 11 seconds  Findings:                 The perianal and digital rectal examinations were                            normal.                           The entire examined colon appeared normal on direct                            and retroflexion views. Complications:            No  immediate complications. Estimated Blood Loss:     Estimated blood loss: none. Impression:               - The entire examined colon is normal on direct and                            retroflexion views.                           - No specimens collected. Recommendation:           - Patient has a contact number available for                            emergencies. The signs and symptoms of potential                            delayed complications were discussed with the                            patient. Return to normal activities tomorrow.                            Written discharge instructions were provided to the                            patient.                           - Resume previous diet. But use Gas and Flkatulence                            prevention diet for guidance to reduce belching and                            gas, also consider Beano.                            - Continue present medications.                           - Repeat colonoscopy in 10 years for screening                            purposes. Gatha Mayer, MD 03/24/2019 8:42:47 AM This report has been signed electronically.

## 2019-03-24 NOTE — Progress Notes (Signed)
Pt's states no medical or surgical changes since previsit or office visit.  Vs by DT  covid screen & temp by [facility]

## 2019-03-26 ENCOUNTER — Encounter: Payer: Federal, State, Local not specified - PPO | Admitting: Internal Medicine

## 2019-03-26 ENCOUNTER — Telehealth: Payer: Self-pay | Admitting: *Deleted

## 2019-03-26 ENCOUNTER — Telehealth: Payer: Self-pay

## 2019-03-26 ENCOUNTER — Other Ambulatory Visit: Payer: Self-pay | Admitting: Family Medicine

## 2019-03-26 DIAGNOSIS — I10 Essential (primary) hypertension: Secondary | ICD-10-CM

## 2019-03-26 NOTE — Telephone Encounter (Signed)
  Follow up Call-  Call back number 03/24/2019  Post procedure Call Back phone  # [phone]  Permission to leave phone message Yes  Some recent data might be hidden     Patient questions:  Do you have a fever, pain , or abdominal swelling? No. Pain Score  0 *  Have you tolerated food without any problems? Yes.    Have you been able to return to your normal activities? Yes.    Do you have any questions about your discharge instructions: Diet   No. Medications  No. Follow up visit  No.  Do you have questions or concerns about your Care? No.  Actions: * If pain score is 4 or above: No action needed, pain <4.  1. Have you developed a fever since your procedure? no  2.   Have you had an respiratory symptoms (SOB or cough) since your procedure? no  3.   Have you tested positive for COVID 19 since your procedure no  4.   Have you had any family members/close contacts diagnosed with the COVID 19 since your procedure?  no   If yes to any of these questions please route to Joylene John, RN and Alphonsa Gin, Therapist, sports.

## 2019-03-26 NOTE — Telephone Encounter (Signed)
Requested medication (s) are due for refill today: yes  Requested medication (s) are on the active medication list: yes  Last refill:  12/26/2018  Future visit scheduled: no  Notes to clinic:  overdue for follow up Review for refill   Requested Prescriptions  Pending Prescriptions Disp Refills   losartan-hydrochlorothiazide (HYZAAR) 100-12.5 MG tablet [Pharmacy Med Name: LOSARTAN-HCTZ 100-12.5 MG TAB] 90 tablet 3    Sig: TAKE 1 TABLET BY MOUTH EVERY DAY      Cardiovascular: ARB + Diuretic Combos Failed - 03/26/2019  1:17 AM      Failed - K in normal range and within 180 days    Potassium  Date Value Ref Range Status  08/23/2018 3.8 3.5 - 5.2 mmol/L Final          Failed - Na in normal range and within 180 days    Sodium  Date Value Ref Range Status  08/23/2018 140 134 - 144 mmol/L Final          Failed - Cr in normal range and within 180 days    Creat  Date Value Ref Range Status  06/23/2015 0.74 0.50 - 0.99 mg/dL Final   Creatinine, Ser  Date Value Ref Range Status  08/23/2018 0.87 0.57 - 1.00 mg/dL Final          Failed - Ca in normal range and within 180 days    Calcium  Date Value Ref Range Status  08/23/2018 9.7 8.7 - 10.3 mg/dL Final          Failed - Last BP in normal range    BP Readings from Last 1 Encounters:  03/24/19 (!) 120/48          Failed - Valid encounter within last 6 months    Recent Outpatient Visits           7 months ago Bloody diarrhea   [facility] at [facility], Ranell Patrick, MD   7 months ago Campylobacter intestinal infection   [facility], Ranell Patrick, MD   1 year ago Encounter for health maintenance examination   Primary Care at [facility], Arlie Solomons, MD   1 year ago Diarrhea, unspecified type   [facility] at [facility], Ranell Patrick, MD   2 years ago Acute URI   [facility] at [place], [place]              Passed - Patient is not pregnant

## 2019-03-26 NOTE — Telephone Encounter (Signed)
Message sent

## 2019-03-28 ENCOUNTER — Other Ambulatory Visit: Payer: Self-pay | Admitting: Family Medicine

## 2019-03-28 DIAGNOSIS — Z1231 Encounter for screening mammogram for malignant neoplasm of breast: Secondary | ICD-10-CM

## 2019-04-01 ENCOUNTER — Other Ambulatory Visit: Payer: Self-pay | Admitting: Family Medicine

## 2019-04-01 ENCOUNTER — Telehealth: Payer: Self-pay | Admitting: *Deleted

## 2019-04-01 DIAGNOSIS — E785 Hyperlipidemia, unspecified: Secondary | ICD-10-CM

## 2019-04-01 NOTE — Telephone Encounter (Signed)
Requested medication (s) are due for refill today: yes  Requested medication (s) are on the active medication list: yes  Last refill: 12/07/2018  Future visit scheduled: no  Notes to clinic: LOV-08/23/2018 Review for refill   Requested Prescriptions  Pending Prescriptions Disp Refills   atorvastatin (LIPITOR) 40 MG tablet [Pharmacy Med Name: ATORVASTATIN 40 MG TABLET] 90 tablet 3    Sig: TAKE 1 TABLET BY MOUTH EVERY DAY      Cardiovascular:  Antilipid - Statins Failed - 04/01/2019  1:38 AM      Failed - Total Cholesterol in normal range and within 360 days    Cholesterol, Total  Date Value Ref Range Status  02/21/2018 206 (H) 100 - 199 mg/dL Final          Failed - LDL in normal range and within 360 days    LDL Calculated  Date Value Ref Range Status  02/21/2018 108 (H) 0 - 99 mg/dL Final          Failed - HDL in normal range and within 360 days    HDL  Date Value Ref Range Status  02/21/2018 57 >39 mg/dL Final          Failed - Triglycerides in normal range and within 360 days    Triglycerides  Date Value Ref Range Status  02/21/2018 207 (H) 0 - 149 mg/dL Final          Passed - Patient is not pregnant      Passed - Valid encounter within last 12 months    Recent Outpatient Visits           7 months ago Bloody diarrhea   [facility] [place], [place]   7 months ago Campylobacter intestinal infection   [facility], Ranell Patrick, MD   1 year ago Encounter for health maintenance examination   [facility] at [facility], Arlie Solomons, MD   1 year ago Diarrhea, unspecified type   [facility], Ranell Patrick, MD   2 years ago Acute URI   [facility] at [facility], Arlie Solomons, MD

## 2019-04-01 NOTE — Telephone Encounter (Signed)
Prescription sent  Will need an appointment for any additional refills

## 2019-05-07 ENCOUNTER — Ambulatory Visit: Payer: Medicare Other

## 2019-05-15 ENCOUNTER — Other Ambulatory Visit: Payer: Self-pay

## 2019-05-15 ENCOUNTER — Ambulatory Visit
Admission: RE | Admit: 2019-05-15 | Discharge: 2019-05-15 | Disposition: A | Discharge status: Home / Self Care | Payer: Medicare Other | Source: Ambulatory Visit | Attending: Family Medicine | Admitting: Family Medicine

## 2019-05-15 DIAGNOSIS — Z1231 Encounter for screening mammogram for malignant neoplasm of breast: Secondary | ICD-10-CM | POA: Diagnosis not present

## 2019-05-16 ENCOUNTER — Ambulatory Visit: Payer: Medicare Other | Attending: Internal Medicine

## 2019-05-16 DIAGNOSIS — Z23 Encounter for immunization: Secondary | ICD-10-CM | POA: Insufficient documentation

## 2019-05-16 NOTE — Progress Notes (Signed)
   Covid-19 Vaccination Clinic  Name:  Michele Lowery    MRN: [id number] DOB: [birth year]  05/16/2019  [name] was observed post Covid-19 immunization for 15 minutes without incidence. She was provided with Vaccine Information Sheet and instruction to access the V-Safe system.   [name] was instructed to call 911 with any severe reactions post vaccine: Marland Kitchen Difficulty breathing  . Swelling of your face and throat  . A fast heartbeat  . A bad rash all over your body  . Dizziness and weakness    Immunizations Administered    Name Date Dose VIS Date Route   Pfizer COVID-19 Vaccine 05/16/2019 10:56 AM 0.3 mL 03/21/2019 Intramuscular   Manufacturer: [place]   Lot: [id number]   [employer]y: [id number]

## 2019-05-21 ENCOUNTER — Encounter: Payer: Self-pay | Admitting: Family Medicine

## 2019-05-21 ENCOUNTER — Ambulatory Visit (INDEPENDENT_AMBULATORY_CARE_PROVIDER_SITE_OTHER): Payer: Medicare Other | Admitting: Family Medicine

## 2019-05-21 ENCOUNTER — Other Ambulatory Visit: Payer: Self-pay

## 2019-05-21 VITALS — BP 120/78 | HR 75 | Temp 98.7°F | Ht 66.0 in | Wt 175.0 lb

## 2019-05-21 DIAGNOSIS — F329 Major depressive disorder, single episode, unspecified: Secondary | ICD-10-CM

## 2019-05-21 DIAGNOSIS — D72829 Elevated white blood cell count, unspecified: Secondary | ICD-10-CM | POA: Diagnosis not present

## 2019-05-21 DIAGNOSIS — F32A Depression, unspecified: Secondary | ICD-10-CM

## 2019-05-21 DIAGNOSIS — E782 Mixed hyperlipidemia: Secondary | ICD-10-CM

## 2019-05-21 DIAGNOSIS — R0789 Other chest pain: Secondary | ICD-10-CM | POA: Diagnosis not present

## 2019-05-21 DIAGNOSIS — Z1329 Encounter for screening for other suspected endocrine disorder: Secondary | ICD-10-CM

## 2019-05-21 DIAGNOSIS — E785 Hyperlipidemia, unspecified: Secondary | ICD-10-CM

## 2019-05-21 DIAGNOSIS — I10 Essential (primary) hypertension: Secondary | ICD-10-CM

## 2019-05-21 MED ORDER — ATORVASTATIN CALCIUM 40 MG PO TABS: 40.0000 mg | ORAL_TABLET | Freq: Every day | ORAL | 3 refills | Status: DC

## 2019-05-21 MED ORDER — ATORVASTATIN CALCIUM 40 MG PO TABS: 40.0000 mg | ORAL_TABLET | Freq: Every day | ORAL | 2 refills | Status: DC

## 2019-05-21 MED ORDER — LOSARTAN POTASSIUM-HCTZ 100-12.5 MG PO TABS: 1.0000 | ORAL_TABLET | Freq: Every day | ORAL | 3 refills | Status: DC

## 2019-05-21 MED ORDER — FLUOXETINE HCL 20 MG PO TABS: 20.0000 mg | ORAL_TABLET | Freq: Every day | ORAL | 3 refills | Status: DC

## 2019-05-21 MED ORDER — LOSARTAN POTASSIUM-HCTZ 100-12.5 MG PO TABS: 1.0000 | ORAL_TABLET | Freq: Every day | ORAL | 2 refills | Status: DC

## 2019-05-21 MED ORDER — FLUOXETINE HCL 20 MG PO TABS: 20.0000 mg | ORAL_TABLET | Freq: Every day | ORAL | 2 refills | Status: DC

## 2019-05-21 NOTE — Patient Instructions (Addendum)
Blood pressure looks good today.  I will check some lab work but no changes in medications for now. Try taking the omeprazole once per day to see if that lessens chest symptoms.  If not improving this week, I do recommend follow-up visit so we can discuss other possible causes.  If any acute worsening of symptoms be seen here or the emergency room right away.  Bump on left leg appears to be a possible lipoma.  Have dermatology look at that area as well at upcoming visit.  If any increased size, pain, or worsening, return for recheck.  Thank you for coming in today.  Nonspecific Chest Pain, Adult Chest pain can be caused by many different conditions. It can be caused by a condition that is life-threatening and requires treatment right away. It can also be caused by something that is not life-threatening. If you have chest pain, it can be hard to know the difference, so it is important to get help right away to make sure that you do not have a serious condition. Some life-threatening causes of chest pain include:  Heart attack.  A tear in the body's main blood vessel (aortic dissection).  Inflammation around your heart (pericarditis).  A problem in the lungs, such as a blood clot (pulmonary embolism) or a collapsed lung (pneumothorax). Some non life-threatening causes of chest pain include:  Heartburn.  Anxiety or stress.  Damage to the bones, muscles, and cartilage that make up your chest wall.  Pneumonia or bronchitis.  Shingles infection (varicella-zoster virus). Chest pain can feel like:  Pain or discomfort on the surface of your chest or deep in your chest.  Crushing, pressure, aching, or squeezing pain.  Burning or tingling.  Dull or sharp pain that is worse when you move, cough, or take a deep breath.  Pain or discomfort that is also felt in your back, neck, jaw, shoulder, or arm, or pain that spreads to any of these areas. Your chest pain may come and go. It may also be  constant. Your health care provider will do lab tests and other studies to find the cause of your pain. Treatment will depend on the cause of your chest pain. Follow these instructions at home: Medicines  Take over-the-counter and prescription medicines only as told by your health care provider.  If you were prescribed an antibiotic, take it as told by your health care provider. Do not stop taking the antibiotic even if you start to feel better. Lifestyle   Rest as directed by your health care provider.  Do not use any products that contain nicotine or tobacco, such as cigarettes and e-cigarettes. If you need help quitting, ask your health care provider.  Do not drink alcohol.  Make healthy lifestyle choices as recommended. These may include: ? Getting regular exercise. Ask your health care provider to suggest some activities that are safe for you. ? Eating a heart-healthy diet. This includes plenty of fresh fruits and vegetables, whole grains, low-fat (lean) protein, and low-fat dairy products. A dietitian can help you find healthy eating options. ? Maintaining a healthy weight. ? Managing any other health conditions you have, such as high blood pressure (hypertension) or diabetes. ? Reducing stress, such as with yoga or relaxation techniques. General instructions  Pay attention to any changes in your symptoms. Tell your health care provider about them or any new symptoms.  Avoid any activities that cause chest pain.  Keep all follow-up visits as told by your health care provider.  This is important. This includes visits for any further testing if your chest pain does not go away. Contact a health care provider if:  Your chest pain does not go away.  You feel depressed.  You have a fever. Get help right away if:  Your chest pain gets worse.  You have a cough that gets worse, or you cough up blood.  You have severe pain in your abdomen.  You faint.  You have sudden,  unexplained chest discomfort.  You have sudden, unexplained discomfort in your arms, back, neck, or jaw.  You have shortness of breath at any time.  You suddenly start to sweat, or your skin gets clammy.  You feel nausea or you vomit.  You suddenly feel lightheaded or dizzy.  You have severe weakness, or unexplained weakness or fatigue.  Your heart begins to beat quickly, or it feels like it is skipping beats. These symptoms may represent a serious problem that is an emergency. Do not wait to see if the symptoms will go away. Get medical help right away. Call your local emergency services (911 in the [place] Do not drive yourself to the hospital. Summary  Chest pain can be caused by a condition that is serious and requires urgent treatment. It may also be caused by something that is not life-threatening.  If you have chest pain, it is very important to see your health care provider. Your health care provider may do lab tests and other studies to find the cause of your pain.  Follow your health care provider's instructions on taking medicines, making lifestyle changes, and getting emergency treatment if symptoms become worse.  Keep all follow-up visits as told by your health care provider. This includes visits for any further testing if your chest pain does not go away. This information is not intended to replace advice given to you by your health care provider. Make sure you discuss any questions you have with your health care provider. Document Revised: 09/27/2017 Document Reviewed: 09/27/2017 Elsevier Patient Education  [employer].   If you have lab work done today you will be contacted with your lab results within the next 2 weeks.  If you have not heard from [place] then please contact us. The fastest way to get your results is to register for My Chart.   IF you received an x-ray today, you will receive an invoice from [facility]. Please contact [facility]  at [phone] with questions or concerns regarding your invoice.   IF you received labwork today, you will receive an invoice from [facility]. Please contact [facility] at [phone] with questions or concerns regarding your invoice.   Our billing staff will not be able to assist you with questions regarding bills from these companies.  You will be contacted with the lab results as soon as they are available. The fastest way to get your results is to activate your My Chart account. Instructions are located on the last page of this paperwork. If you have not heard from [place] regarding the results in 2 weeks, please contact this office.
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
## 2019-05-22 LAB — CBC
Hematocrit: 43.5 % (ref 34.0–46.6)
Hemoglobin: 14.6 g/dL (ref 11.1–15.9)
MCH: 29.3 pg (ref 26.6–33.0)
MCHC: 33.6 g/dL (ref 31.5–35.7)
MCV: 87 fL (ref 79–97)
Platelets: 379 10*3/uL (ref 150–450)
RBC: 4.98 x10E6/uL (ref 3.77–5.28)
RDW: 12.6 % (ref 11.7–15.4)
WBC: 9.1 10*3/uL (ref 3.4–10.8)

## 2019-05-22 LAB — LIPID PANEL
Chol/HDL Ratio: 3.1 ratio (ref 0.0–4.4)
Cholesterol, Total: 185 mg/dL (ref 100–199)
HDL: 59 mg/dL (ref >39)
LDL Chol Calc (NIH): 91 mg/dL (ref 0–99)
Triglycerides: 211 mg/dL — ABNORMAL HIGH (ref 0–149)
VLDL Cholesterol Cal: 35 mg/dL (ref 5–40)

## 2019-05-22 LAB — COMPREHENSIVE METABOLIC PANEL
ALT: 30 IU/L (ref 0–32)
AST: 25 IU/L (ref 0–40)
Albumin/Globulin Ratio: 2.5 — ABNORMAL HIGH (ref 1.2–2.2)
Albumin: 4.9 g/dL — ABNORMAL HIGH (ref 3.8–4.8)
Alkaline Phosphatase: 112 IU/L (ref 39–117)
BUN/Creatinine Ratio: 15 (ref 12–28)
BUN: 13 mg/dL (ref 8–27)
Bilirubin Total: 0.5 mg/dL (ref 0.0–1.2)
CO2: 26 mmol/L (ref 20–29)
Calcium: 10.3 mg/dL (ref 8.7–10.3)
Chloride: 100 mmol/L (ref 96–106)
Creatinine, Ser: 0.86 mg/dL (ref 0.57–1.00)
GFR calc Af Amer: 82 mL/min/{1.73_m2} (ref >59)
GFR calc non Af Amer: 71 mL/min/{1.73_m2} (ref >59)
Globulin, Total: 2 g/dL (ref 1.5–4.5)
Glucose: 96 mg/dL (ref 65–99)
Potassium: 4.1 mmol/L (ref 3.5–5.2)
Sodium: 140 mmol/L (ref 134–144)
Total Protein: 6.9 g/dL (ref 6.0–8.5)

## 2019-05-22 LAB — TSH: TSH: 2.18 u[IU]/mL (ref 0.450–4.500)

## 2019-05-27 ENCOUNTER — Telehealth: Payer: Self-pay | Admitting: Family Medicine

## 2019-05-27 ENCOUNTER — Other Ambulatory Visit: Payer: Self-pay | Admitting: *Deleted

## 2019-05-27 DIAGNOSIS — F32A Depression, unspecified: Secondary | ICD-10-CM

## 2019-05-27 DIAGNOSIS — F329 Major depressive disorder, single episode, unspecified: Secondary | ICD-10-CM

## 2019-05-27 MED ORDER — FLUOXETINE HCL 20 MG PO TABS: 20.0000 mg | ORAL_TABLET | Freq: Every day | ORAL | 3 refills | Status: DC

## 2019-05-27 NOTE — Telephone Encounter (Signed)
Prescription resent to [facility]

## 2019-05-27 NOTE — Telephone Encounter (Signed)
Pt is not planning on picking up her FLUoxetine (PROZAC) 20 MG tablet [id number] script from [facility], it costs 200$. Pt went on line and ordered through [employer] and with no money she can get a 90 day script for FLUoxetine HCL.  20MG . [facility] mail order will send PCP a request. This is what pt is wanting if you have any questions please call [place] [phone].

## 2019-05-28 ENCOUNTER — Ambulatory Visit: Payer: Medicare Other

## 2019-06-11 ENCOUNTER — Ambulatory Visit: Payer: Medicare Other | Attending: Internal Medicine

## 2019-06-11 DIAGNOSIS — Z23 Encounter for immunization: Secondary | ICD-10-CM | POA: Insufficient documentation

## 2019-06-11 NOTE — Progress Notes (Signed)
   Covid-19 Vaccination Clinic  Name:  Lateia Brumbley    MRN: [id number] DOB: 07/14/1955  06/11/2019  Ms. Harrigan was observed post Covid-19 immunization for 15 minutes without incident. She was provided with Vaccine Information Sheet and instruction to access the V-Safe system.   Ms. Brammer was instructed to call 911 with any severe reactions post vaccine: Marland Kitchen Difficulty breathing  . Swelling of face and throat  . A fast heartbeat  . A bad rash all over body  . Dizziness and weakness   Immunizations Administered    Name Date Dose VIS Date Route   Pfizer COVID-19 Vaccine 06/11/2019  1:27 PM 0.3 mL 03/21/2019 Intramuscular   Manufacturer: [place]   Lot: [id number]   [place]: [id number]

## 2019-06-13 ENCOUNTER — Telehealth: Payer: Self-pay

## 2019-06-13 ENCOUNTER — Other Ambulatory Visit: Payer: Self-pay | Admitting: Family Medicine

## 2019-06-13 ENCOUNTER — Encounter: Payer: Self-pay | Admitting: Family Medicine

## 2019-06-13 MED ORDER — FLUOXETINE HCL 20 MG PO CAPS: 20.0000 mg | ORAL_CAPSULE | Freq: Every day | ORAL | 3 refills | Status: DC

## 2019-06-13 NOTE — Telephone Encounter (Signed)
Pt insurance will not pay for the tablet prozac, pt is needing new rx for capsule.

## 2019-06-26 DIAGNOSIS — R208 Other disturbances of skin sensation: Secondary | ICD-10-CM | POA: Diagnosis not present

## 2019-06-26 DIAGNOSIS — L820 Inflamed seborrheic keratosis: Secondary | ICD-10-CM | POA: Diagnosis not present

## 2019-07-21 ENCOUNTER — Telehealth: Payer: Self-pay | Admitting: *Deleted

## 2019-07-21 NOTE — Telephone Encounter (Signed)
Patient will call back when she gets back from vacation.

## 2019-11-22 IMAGING — MG DIGITAL SCREENING BILATERAL MAMMOGRAM WITH TOMO AND CAD
8 series · 8 of 24 positions shown · non-contrast
Comparison: Previous exam(s).

CLINICAL DATA: Screening.

EXAM:
DIGITAL SCREENING BILATERAL MAMMOGRAM WITH TOMO AND CAD

[L CC synth-2D]
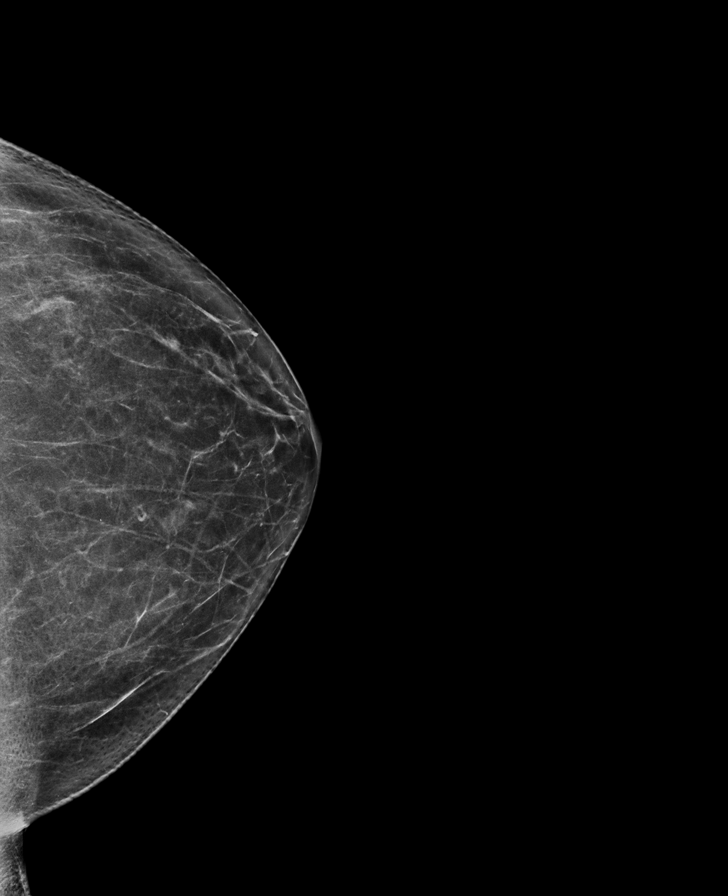

[R MLO synth-2D]
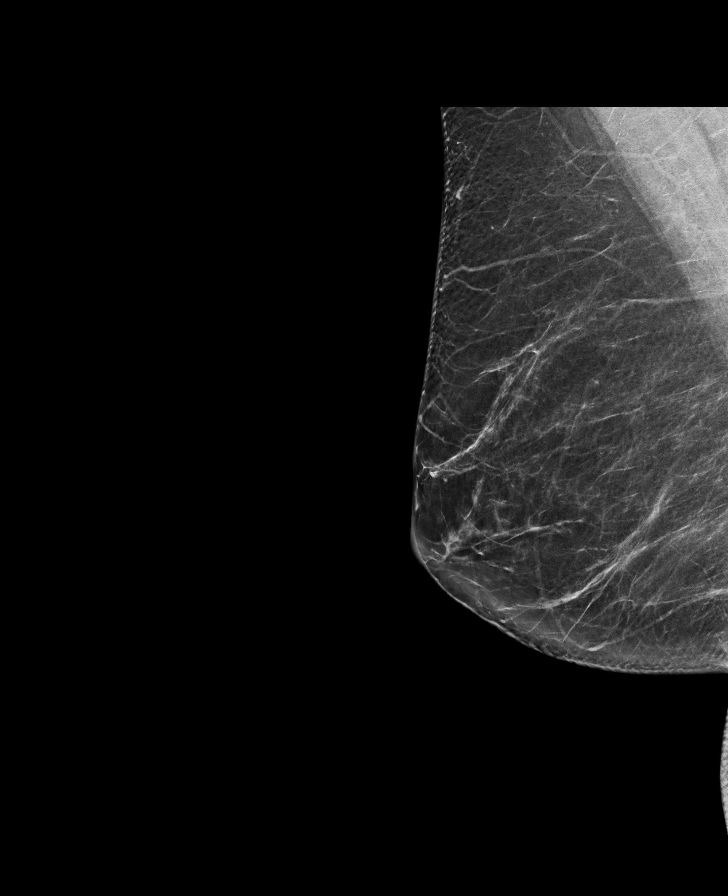

[R CC synth-2D]
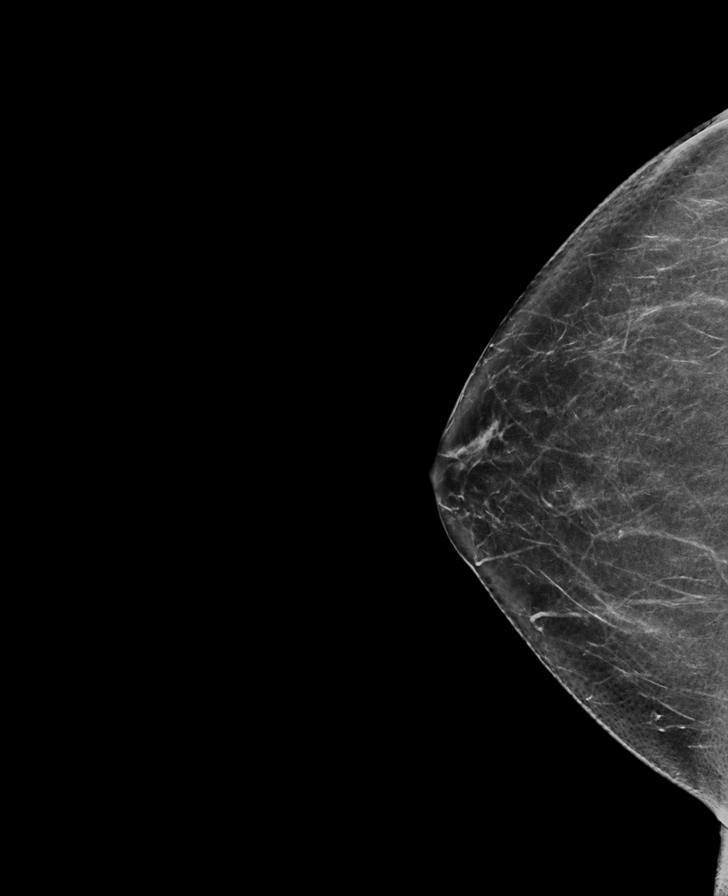

[L MLO synth-2D]
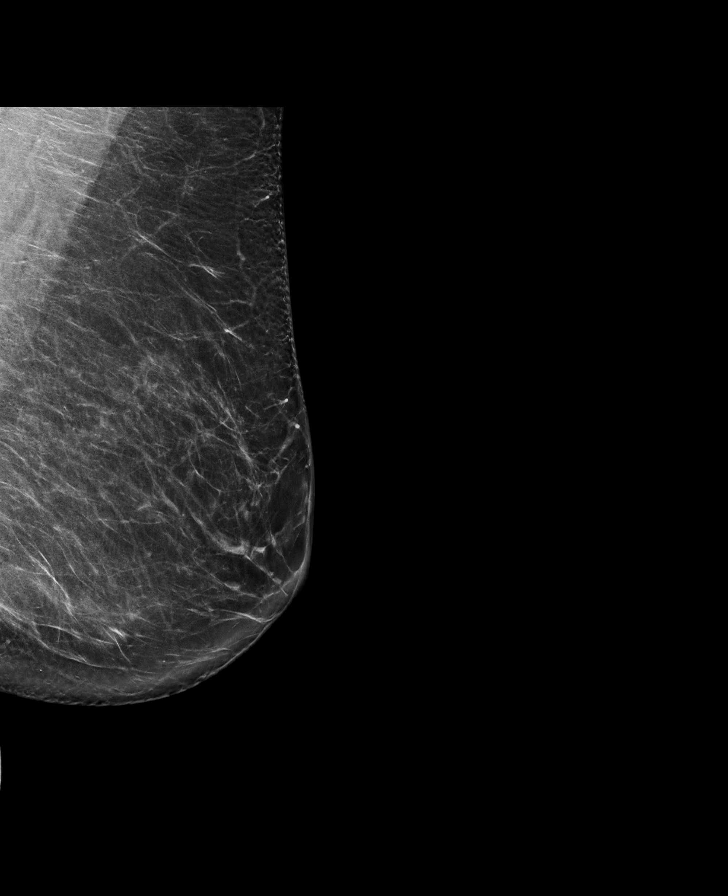

[L MLO tomo · tomo slice 41/80.0]
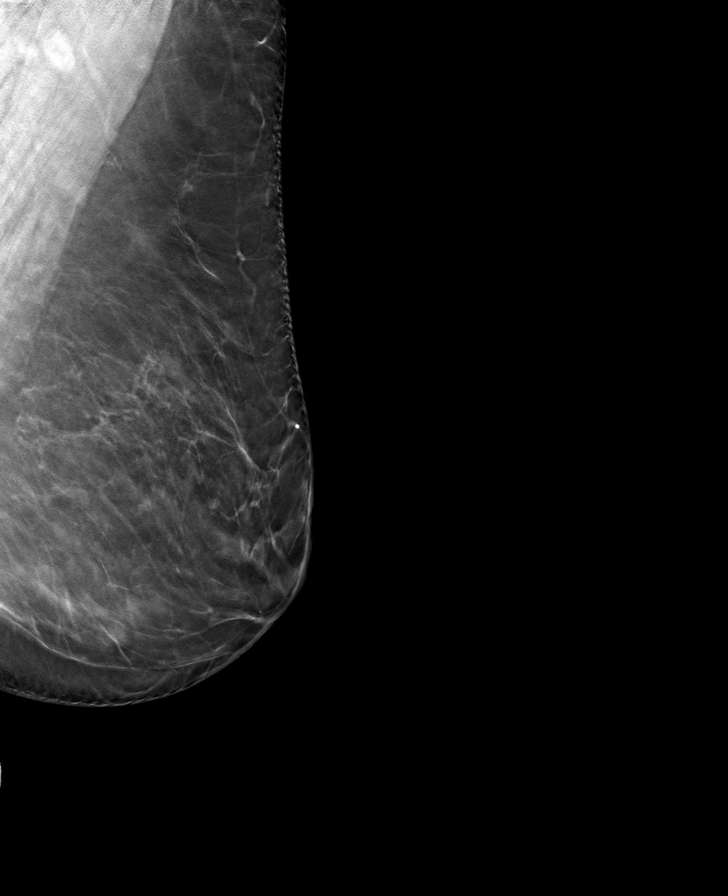

[R MLO tomo · tomo slice 39/77.0]
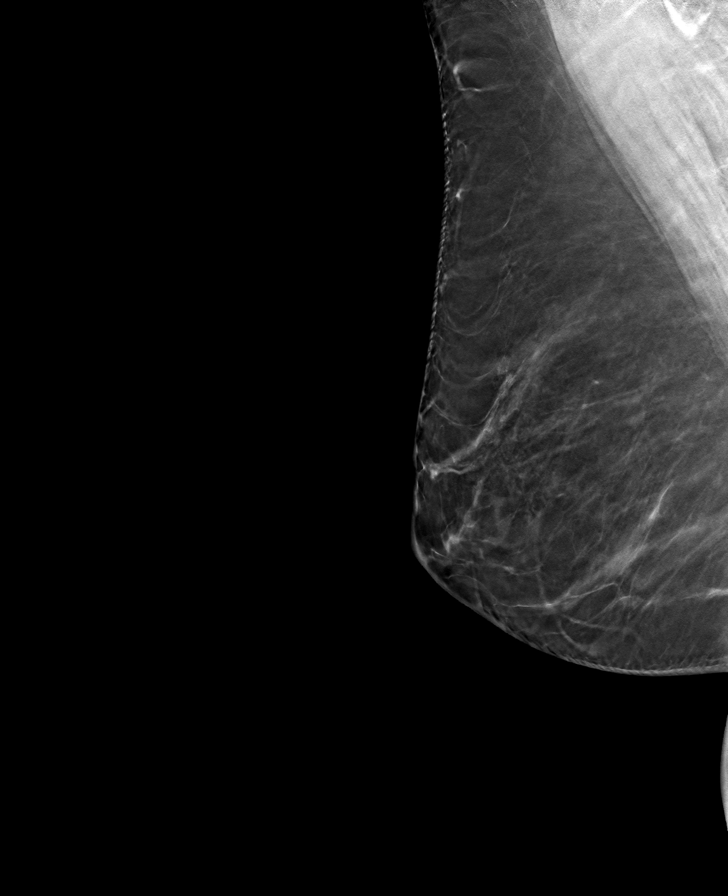

[L CC tomo · tomo slice 37/73.0]
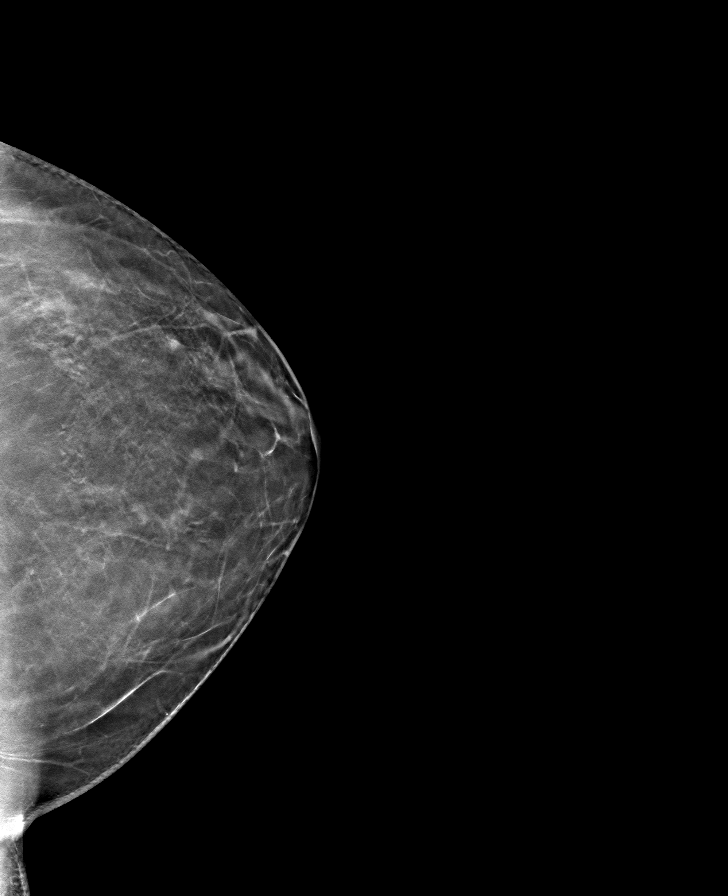

[R CC tomo · tomo slice 37/72.0]
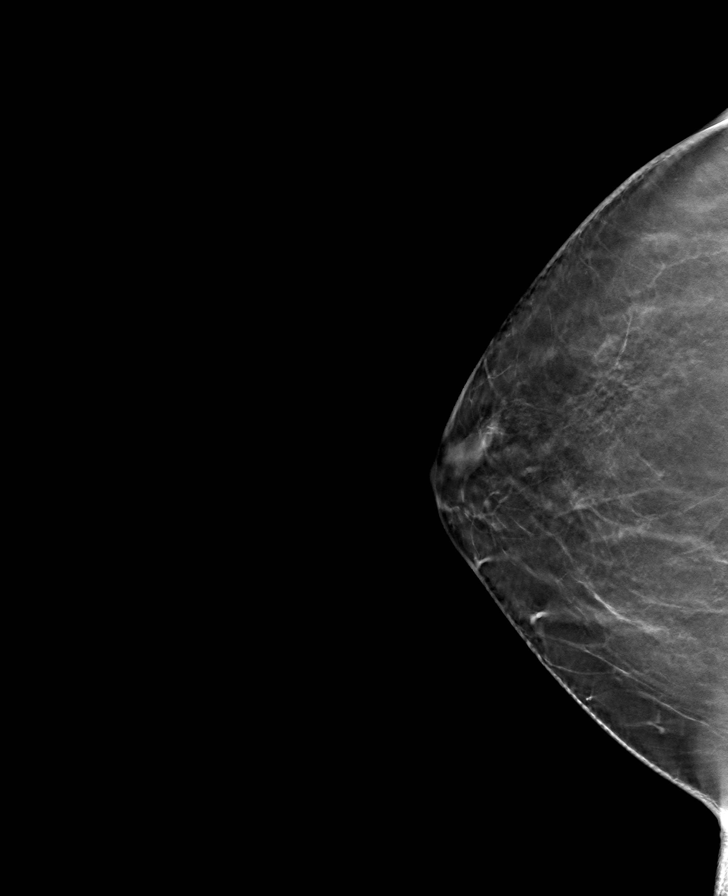

[8 of 24 positions shown; findings below may reference images not displayed]

ACR Breast Density Category b: There are scattered areas of
fibroglandular density.
FINDINGS: In the left breast, a possible mass warrants further evaluation. In
the right breast, no findings suspicious for malignancy.

Images were processed with CAD.
IMPRESSION: Further evaluation is suggested for possible mass in the left
breast.

RECOMMENDATION:
Diagnostic mammogram and possibly ultrasound of the left breast.
(Code:JC-2-SSL)

The patient will be contacted regarding the findings, and additional
imaging will be scheduled.

BI-RADS CATEGORY  0: Incomplete. Need additional imaging evaluation
and/or prior mammograms for comparison.

## 2019-11-25 IMAGING — US ULTRASOUND LEFT BREAST LIMITED
1 series · 4 of 4 positions shown · non-contrast
Comparison: Previous exam(s).

CLINICAL DATA: 62-year-old female recalled from screening mammogram
dated 02/11/2018 for a possible left breast mass.

EXAM:
DIGITAL DIAGNOSTIC LEFT MAMMOGRAM WITH CAD AND TOMO
ULTRASOUND LEFT BREAST

[Series 1: ultrasound left breast limited · 0.06mm/px · 4 of 4 slices shown]
[im 1/4]
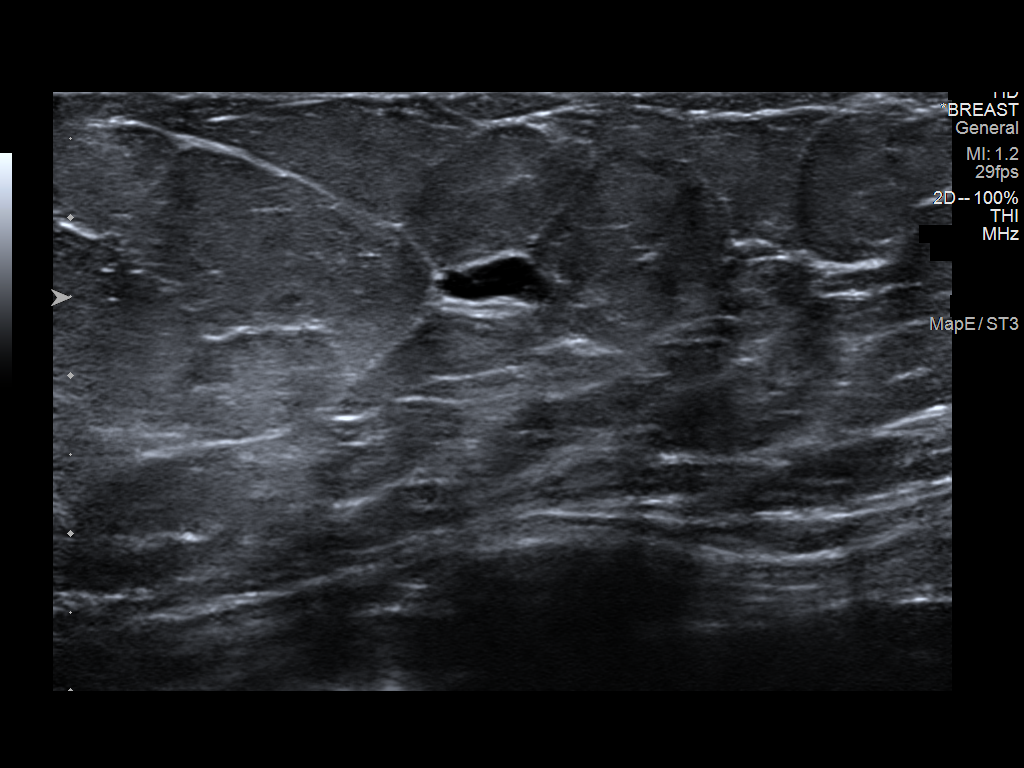
[im 2/4]
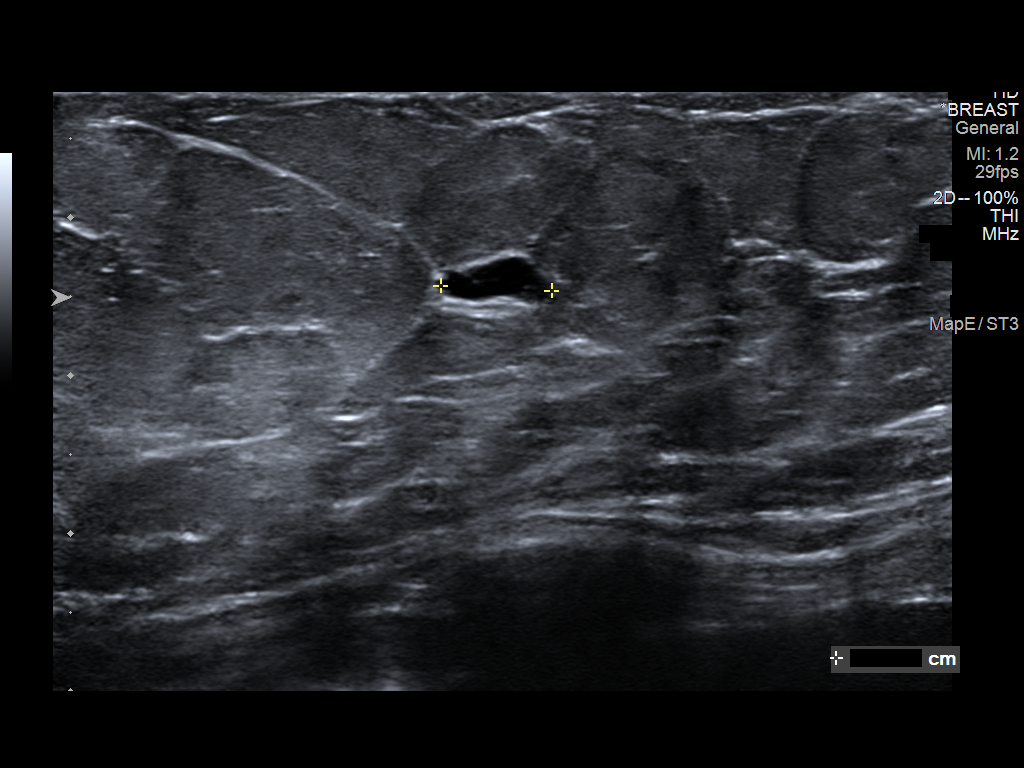
[im 3/4]
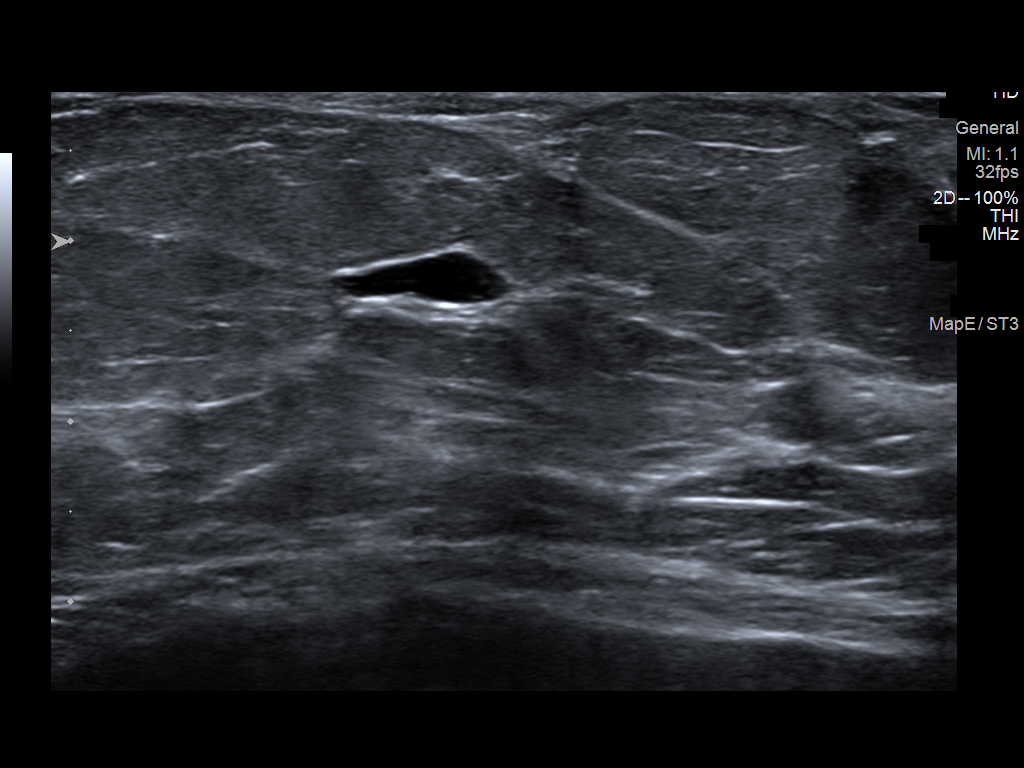
[im 4/4]
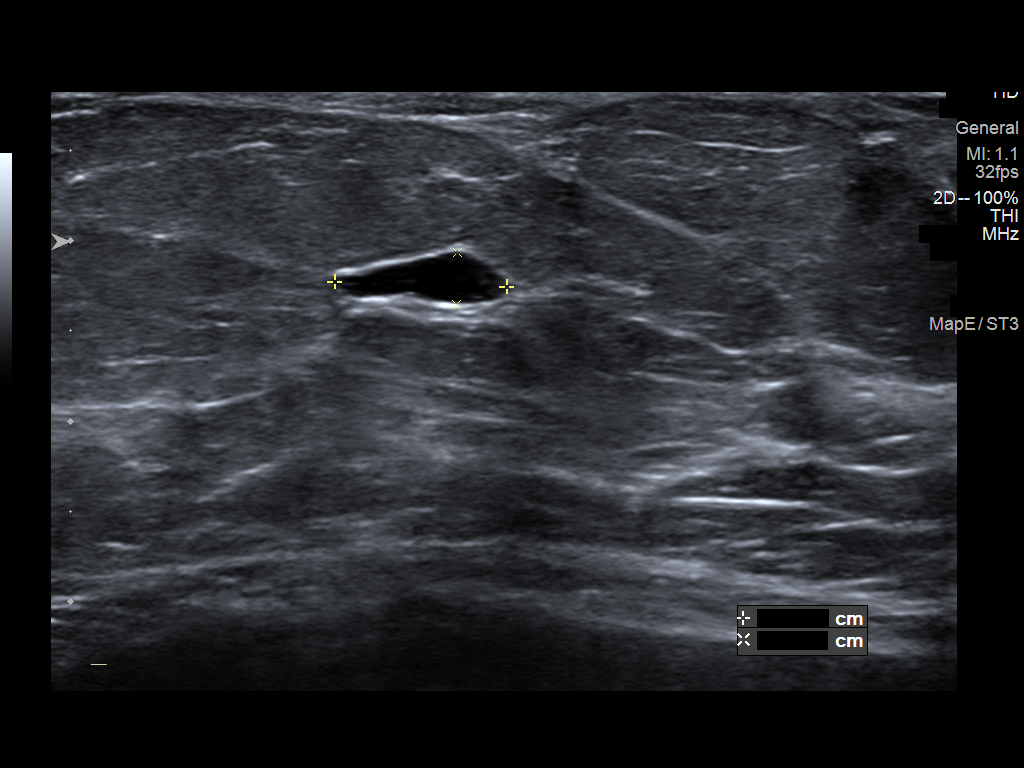

[4 of 4 positions shown; findings below may reference images not displayed]

ACR Breast Density Category b: There are scattered areas of
fibroglandular density.
FINDINGS: There is a persistent, oval partially circumscribed and partially
obscured mass in the lower inner left breast at middle depth.
Further evaluation with ultrasound was performed.

Mammographic images were processed with CAD.

Targeted ultrasound is performed, showing an oval, circumscribed
anechoic mass at the 6 o'clock position 2 cm from the nipple. Note
is made of mobile internal debris on real-time scanning. There is no
associated vascularity. It measures 10 x 7 x 3 mm. This correlates
well with the mammographic finding and is consistent with a simple
cyst.
IMPRESSION: Benign simple cyst corresponding with the screening mammographic
findings.

RECOMMENDATION:
Screening mammogram in one year.(Code:AB-V-ZKN)

I have discussed the findings and recommendations with the patient.
Results were also provided in writing at the conclusion of the
visit. If applicable, a reminder letter will be sent to the patient
regarding the next appointment.

BI-RADS CATEGORY  2: Benign.

## 2020-02-19 ENCOUNTER — Ambulatory Visit: Payer: Medicare Other | Admitting: Internal Medicine

## 2020-02-19 ENCOUNTER — Other Ambulatory Visit (INDEPENDENT_AMBULATORY_CARE_PROVIDER_SITE_OTHER): Payer: Medicare Other

## 2020-02-19 ENCOUNTER — Encounter: Payer: Self-pay | Admitting: Internal Medicine

## 2020-02-19 ENCOUNTER — Telehealth: Payer: Self-pay | Admitting: Internal Medicine

## 2020-02-19 VITALS — BP 128/74 | HR 68 | Ht 66.0 in | Wt 173.0 lb

## 2020-02-19 DIAGNOSIS — N824 Other female intestinal-genital tract fistulae: Secondary | ICD-10-CM

## 2020-02-19 DIAGNOSIS — N898 Other specified noninflammatory disorders of vagina: Secondary | ICD-10-CM | POA: Diagnosis not present

## 2020-02-19 LAB — CREATININE, SERUM: Creatinine, Ser: 0.88 mg/dL (ref 0.40–1.20)

## 2020-02-19 LAB — BUN: BUN: 18 mg/dL (ref 6–23)

## 2020-02-19 NOTE — Telephone Encounter (Signed)
I could see her this afternoon in a banding slot

## 2020-02-19 NOTE — Progress Notes (Signed)
Michele Lowery 64 y.o. 1956/03/10 [id number]  Assessment & Plan:   Encounter Diagnoses  Name Primary?  . Vaginal discharge Yes  . Fistula, colovaginal?    We will go ahead and get a CT scan of the abdomen and pelvis with oral IV and rectal contrast to see if we can determine if there is some sort of a fistulous connection between the bowel and her vagina.  I explained this can be difficult but we typically get a CT scan.  She may need to see gynecology.  Interesting I did not see diverticulosis before that was not identified on a remote CT scan of 2012 though colonoscopy can miss diverticulosis.  She does not seem to have symptoms of diverticulitis either.  However that can be amazingly asymptomatic at times and present with a fistula as well.  Another possibility could be Crohn's disease.  Admittedly history could be inaccurate as well but that is what we have to go on.  CC: Wendie Agreste, MD     Subjective:   Chief Complaint: Fecal vaginal discharge question fistula  HPI [name] is a 64 year old white woman last seen by me for colonoscopy in December 2020 also with a history of reflux disease who says she had stool come out of her vagina yesterday.  Colonoscopy in December was normal.  I advised Beano and other methods to try to reduce gas.  She still complains of a lot of burping despite trying the Beano.  He says since that colonoscopy she said some intermittent mild bleeding dark or discolored vaginal discharge at times.  No dyspareunia no urinary symptoms.  No abdominal pain.  She is convinced she had a decent amount of feces come out of her vagina she had to wipe 4 times yesterday.  She has not seen that since.  Not been to a gynecologist in some time.  She said she was surprised she was told by primary care she did not need Pap smears anymore.  She has not had gynecologic surgery. Allergies  Allergen Reactions  . Amlodipine   . Desipramine   . Doxycycline Nausea And  Vomiting    REACTION: nausea and vomiting  . Lisinopril Cough  . Sulfonamide Derivatives     REACTION: "i can't remember the reaction"   Current Meds  Medication Sig  . aspirin 81 MG tablet Take 81 mg by mouth daily.    Marland Kitchen atorvastatin (LIPITOR) 40 MG tablet Take 1 tablet (40 mg total) by mouth daily.  Marland Kitchen b complex vitamins tablet Take 1 tablet by mouth daily.  . cholecalciferol (VITAMIN D) 400 UNITS TABS tablet Take 400 Units by mouth.  . famotidine (PEPCID) 10 MG tablet Take 10 mg by mouth 2 (two) times daily.  Marland Kitchen FLUoxetine (PROZAC) 20 MG capsule Take 1 capsule (20 mg total) by mouth daily.  Marland Kitchen losartan-hydrochlorothiazide (HYZAAR) 100-12.5 MG tablet Take 1 tablet by mouth daily.  . Omega-3 Fatty Acids (FISH OIL PO) Take 4 tablets by mouth daily.    Past Medical History:  Diagnosis Date  . Anxiety   . Depression   . HLD (hyperlipidemia)   . HTN (hypertension)   . Hx: UTI (urinary tract infection)   . Internal hemorrhoids 11/13/08  . Migraine headache 05/07/2011   h/o atypical migraines  . Rosacea   . Vertigo    Past Surgical History:  Procedure Laterality Date  . COLONOSCOPY  11/13/2008   internal hemrrhoids   Social History   Social History Narrative  1 caffeine drink daily    family history includes COPD in her mother; Heart disease in her father.   Review of Systems As above  Objective:   Physical Exam BP 128/74   Pulse 68   Ht 5\' 6"  (1.676 m)   Wt 173 lb (78.5 kg)   BMI 27.92 kg/m  Well-developed well-nourished white woman no acute distress Abdomen is soft nontender no organomegaly or mass bowel sounds are present  Reviewed as per HPI I have reviewed previous GI visits going back to 2017

## 2020-02-19 NOTE — Patient Instructions (Signed)
You have been scheduled for a CT scan of the abdomen and pelvis at [facility], 1st floor Radiology. You are scheduled on 02/27/20  at 7:30am. You should arrive 15 minutes prior to your appointment time for registration.  Please pick up 2 bottles of contrast from [place] at least 3 days prior to your scan. The solution may taste better if refrigerated, but do NOT add ice or any other liquid to this solution. Shake well before drinking.   Please follow the written instructions below on the day of your exam:   1) Do not eat anything after 3:30am (4 hours prior to your test)   2) Drink 1 bottle of contrast @ 5:30am (2 hours prior to your exam)  Remember to shake well before drinking and do NOT pour over ice.     Drink 1 bottle of contrast @ 6:30am (1 hour prior to your exam)   You may take any medications as prescribed with a small amount of water, if necessary. If you take any of the following medications: METFORMIN, GLUCOPHAGE, GLUCOVANCE, AVANDAMET, RIOMET, FORTAMET, [place] MET, JANUMET, GLUMETZA or METAGLIP, you MAY be asked to HOLD this medication 48 hours AFTER the exam.   The purpose of you drinking the oral contrast is to aid in the visualization of your intestinal tract. The contrast solution may cause some diarrhea. Depending on your individual set of symptoms, you may also receive an intravenous injection of x-ray contrast/dye. Plan on being at [facility] for 45 minutes or longer, depending on the type of exam you are having performed.   If you have any questions regarding your exam or if you need to reschedule, you may call [facility] at [phone] between the hours of 8:00 am and 5:00 pm, Monday-Friday.   LABS: Your provider has requested that you go to the basement level for lab work before leaving today. Press "B" on the elevator. The lab is located at the first door on the left as you exit the elevator.  HEALTHCARE LAWS AND MY CHART RESULTS: Due to recent  changes in healthcare laws, you may see the results of your imaging and laboratory studies on MyChart before your provider has had a chance to review them.  We understand that in some cases there may be results that are confusing or concerning to you. Not all laboratory results come back in the same time frame and the provider may be waiting for multiple results in order to interpret others.  Please give [place] 48 hours in order for your provider to thoroughly review all the results before contacting the office for clarification of your results.   If you are age 66 or older, your body mass index should be between 23-30. Your Body mass index is 27.92 kg/m. If this is out of the aforementioned range listed, please consider follow up with your Primary Care Provider.

## 2020-02-19 NOTE — Telephone Encounter (Signed)
Patient scheduled for office visit today at 3:10pm with Dr. Carlean Purl for possible rectovaginal fistula. (per Dr. Carlean Purl). CMA - Ammie notified.

## 2020-02-19 NOTE — Telephone Encounter (Signed)
Called patient back and she states she had soft stool discharge from her vagina yesterday. States it was a normal brown color, no blood. She felt like she had to urinate and instead had stool come out from her vagina. Afraid she might have a recto vaginal fistula. Please advise

## 2020-02-27 ENCOUNTER — Encounter (HOSPITAL_COMMUNITY): Payer: Self-pay

## 2020-02-27 ENCOUNTER — Other Ambulatory Visit: Payer: Self-pay

## 2020-02-27 ENCOUNTER — Ambulatory Visit (HOSPITAL_COMMUNITY)
Admission: RE | Admit: 2020-02-27 | Discharge: 2020-02-27 | Disposition: A | Discharge status: Home / Self Care | Payer: Medicare Other | Source: Ambulatory Visit | Attending: Internal Medicine | Admitting: Internal Medicine

## 2020-02-27 DIAGNOSIS — N824 Other female intestinal-genital tract fistulae: Secondary | ICD-10-CM | POA: Insufficient documentation

## 2020-02-27 DIAGNOSIS — M47816 Spondylosis without myelopathy or radiculopathy, lumbar region: Secondary | ICD-10-CM | POA: Diagnosis not present

## 2020-02-27 DIAGNOSIS — N898 Other specified noninflammatory disorders of vagina: Secondary | ICD-10-CM | POA: Insufficient documentation

## 2020-02-27 DIAGNOSIS — I700 Atherosclerosis of aorta: Secondary | ICD-10-CM | POA: Diagnosis not present

## 2020-02-27 DIAGNOSIS — K449 Diaphragmatic hernia without obstruction or gangrene: Secondary | ICD-10-CM | POA: Diagnosis not present

## 2020-02-27 MED ORDER — IOHEXOL 300 MG/ML  SOLN
30.0000 mL | Freq: Once | INTRAMUSCULAR | Status: AC | PRN
  Administered 2020-02-27: 30 mL

## 2020-02-27 MED ORDER — SODIUM CHLORIDE 0.9 % IV SOLN
INTRAVENOUS | Status: AC
  Filled 2020-02-27: qty 1000

## 2020-02-27 MED ORDER — IOHEXOL 300 MG/ML  SOLN
100.0000 mL | Freq: Once | INTRAMUSCULAR | Status: AC | PRN
  Administered 2020-02-27: 100 mL via INTRAVENOUS

## 2020-03-08 ENCOUNTER — Other Ambulatory Visit: Payer: Self-pay

## 2020-03-08 DIAGNOSIS — K219 Gastro-esophageal reflux disease without esophagitis: Secondary | ICD-10-CM

## 2020-03-08 DIAGNOSIS — K449 Diaphragmatic hernia without obstruction or gangrene: Secondary | ICD-10-CM

## 2020-03-18 ENCOUNTER — Telehealth (INDEPENDENT_AMBULATORY_CARE_PROVIDER_SITE_OTHER): Payer: Medicare Other | Admitting: Family Medicine

## 2020-03-18 ENCOUNTER — Encounter: Payer: Self-pay | Admitting: Family Medicine

## 2020-03-18 ENCOUNTER — Encounter (HOSPITAL_COMMUNITY): Payer: Self-pay

## 2020-03-18 ENCOUNTER — Ambulatory Visit (HOSPITAL_COMMUNITY): Payer: Medicare Other

## 2020-03-18 ENCOUNTER — Emergency Department (HOSPITAL_COMMUNITY)
Admission: EM | Admit: 2020-03-18 | Discharge: 2020-03-18 | Disposition: A | Discharge status: Home / Self Care | Payer: Medicare Other | Attending: Emergency Medicine | Admitting: Emergency Medicine

## 2020-03-18 ENCOUNTER — Other Ambulatory Visit: Payer: Self-pay

## 2020-03-18 DIAGNOSIS — Z7982 Long term (current) use of aspirin: Secondary | ICD-10-CM | POA: Insufficient documentation

## 2020-03-18 DIAGNOSIS — Z87891 Personal history of nicotine dependence: Secondary | ICD-10-CM | POA: Diagnosis not present

## 2020-03-18 DIAGNOSIS — K529 Noninfective gastroenteritis and colitis, unspecified: Secondary | ICD-10-CM | POA: Diagnosis not present

## 2020-03-18 DIAGNOSIS — Z79899 Other long term (current) drug therapy: Secondary | ICD-10-CM | POA: Insufficient documentation

## 2020-03-18 DIAGNOSIS — I10 Essential (primary) hypertension: Secondary | ICD-10-CM | POA: Diagnosis not present

## 2020-03-18 DIAGNOSIS — R109 Unspecified abdominal pain: Secondary | ICD-10-CM | POA: Diagnosis present

## 2020-03-18 DIAGNOSIS — R197 Diarrhea, unspecified: Secondary | ICD-10-CM

## 2020-03-18 LAB — CBC
HCT: 44.8 % (ref 36.0–46.0)
Hemoglobin: 14.7 g/dL (ref 12.0–15.0)
MCH: 29.9 pg (ref 26.0–34.0)
MCHC: 32.8 g/dL (ref 30.0–36.0)
MCV: 91.2 fL (ref 80.0–100.0)
Platelets: 320 10*3/uL (ref 150–400)
RBC: 4.91 MIL/uL (ref 3.87–5.11)
RDW: 12.6 % (ref 11.5–15.5)
WBC: 18.3 10*3/uL — ABNORMAL HIGH (ref 4.0–10.5)
nRBC: 0 % (ref 0.0–0.2)

## 2020-03-18 LAB — COMPREHENSIVE METABOLIC PANEL
ALT: 21 U/L (ref 0–44)
AST: 16 U/L (ref 15–41)
Albumin: 4.2 g/dL (ref 3.5–5.0)
Alkaline Phosphatase: 75 U/L (ref 38–126)
Anion gap: 14 (ref 5–15)
BUN: 16 mg/dL (ref 8–23)
CO2: 26 mmol/L (ref 22–32)
Calcium: 9.2 mg/dL (ref 8.9–10.3)
Chloride: 99 mmol/L (ref 98–111)
Creatinine, Ser: 0.8 mg/dL (ref 0.44–1.00)
GFR, Estimated: >60 mL/min (ref >60)
Glucose, Bld: 103 mg/dL — ABNORMAL HIGH (ref 70–99)
Potassium: 3.5 mmol/L (ref 3.5–5.1)
Sodium: 139 mmol/L (ref 135–145)
Total Bilirubin: 1.2 mg/dL (ref 0.3–1.2)
Total Protein: 7.1 g/dL (ref 6.5–8.1)

## 2020-03-18 LAB — TYPE AND SCREEN
ABO/RH(D): O POS
Antibody Screen: NEGATIVE

## 2020-03-18 LAB — LACTIC ACID, PLASMA: Lactic Acid, Venous: 1 mmol/L (ref 0.5–1.9)

## 2020-03-18 MED ORDER — METRONIDAZOLE 500 MG PO TABS
500.0000 mg | ORAL_TABLET | Freq: Once | ORAL | Status: AC
  Administered 2020-03-18: 500 mg via ORAL
  Filled 2020-03-18: qty 1

## 2020-03-18 MED ORDER — SODIUM CHLORIDE 0.9 % IV BOLUS
1000.0000 mL | Freq: Once | INTRAVENOUS | Status: AC
  Administered 2020-03-18: 1000 mL via INTRAVENOUS

## 2020-03-18 MED ORDER — ACETAMINOPHEN 325 MG PO TABS
650.0000 mg | ORAL_TABLET | Freq: Once | ORAL | Status: AC | PRN
  Administered 2020-03-18: 650 mg via ORAL
  Filled 2020-03-18: qty 2

## 2020-03-18 MED ORDER — CIPROFLOXACIN HCL 500 MG PO TABS: 500.0000 mg | ORAL_TABLET | Freq: Two times a day (BID) | ORAL | 0 refills | Status: DC

## 2020-03-18 MED ORDER — CIPROFLOXACIN HCL 500 MG PO TABS
500.0000 mg | ORAL_TABLET | Freq: Once | ORAL | Status: AC
  Administered 2020-03-18: 500 mg via ORAL
  Filled 2020-03-18: qty 1

## 2020-03-18 MED ORDER — METRONIDAZOLE 500 MG PO TABS: 500.0000 mg | ORAL_TABLET | Freq: Two times a day (BID) | ORAL | 0 refills | Status: AC

## 2020-03-18 NOTE — Patient Instructions (Signed)
Bloody Diarrhea Bloody diarrhea is frequent loose and watery bowel movements that contain blood. The blood can be hard to see or notice (occult). Bloody diarrhea may be caused by medical conditions such as:  Ulcerative colitis.  Crohn's disease.  Intestinal infection.  Viral gastroenteritis or bacterial gastroenteritis. Finding out why there is blood in your diarrhea is necessary so that your health care provider can prescribe the right treatment for you. Follow the instructions from your health care provider about treating the cause of your bloody diarrhea. Any type of diarrhea can make you feel weak and dehydrated. Dehydration can make you tired and thirsty, cause you to have a dry mouth, and decrease how often you urinate. Follow these instructions at home: Eating and drinking     Follow these recommendations as told by your health care provider:  Take an oral rehydration solution (ORS). This is an over-the-counter medicine that helps return your body to its normal balance of nutrients and water. It is found at pharmacies and retail stores.  Drink enough fluid to keep your urine pale yellow. ? Drink fluids such as water, ice chips, diluted fruit juice, and low-calorie sports drinks. You can also drink milk products, if desired. ? Avoid drinking fluids that contain a lot of sugar or caffeine, such as energy drinks, regular sports drinks, and soda. ? Avoid alcohol.  Eat bland, easy-to-digest foods in small amounts as you are able. These foods include bananas, applesauce, rice, lean meats, toast, and crackers.  Avoid spicy or fatty foods.  Medicines  Take over-the-counter and prescription medicines only as told by your health care provider. ? Your health care provider may prescribe medicine to slow down the frequency of diarrhea or to ease stomach discomfort.  If you were prescribed an antibiotic medicine, take it as told by your health care provider. Do not stop using the  antibiotic even if you start to feel better. General instructions   Wash your hands often using soap and water. If soap and water are not available, use a hand sanitizer. Others in the household should wash their hands as well. Hands should be washed: ? After using the toilet or changing a diaper. ? Before preparing, cooking, or serving food. ? While caring for a sick person or while visiting someone in a hospital.  Rest at home while you recover.  Take a warm bath to relieve any burning or pain from frequent diarrhea episodes.  Watch your condition for any changes.  Keep all follow-up visits as told by your health care provider. This is important. Contact a health care provider if:  You have a fever.  Your diarrhea gets worse.  You have new symptoms.  You cannot keep fluids down.  You feel light-headed or dizzy.  You have a headache.  You have muscle cramps. Get help right away if:  You have chest pain.  You feel extremely weak or you faint.  The blood in your diarrhea increases or turns a different color.  You vomit and the vomit is bloody or looks black.  You have persistent diarrhea.  You have severe pain, cramping, or bloating in your abdomen.  You have trouble breathing or you are breathing very quickly.  Your heart is beating very quickly.  Your skin feels cold and clammy.  You feel confused.  You have signs of dehydration, such as: ? Dark urine, very little urine, or no urine. ? Cracked lips. ? Dry mouth. ? Sunken eyes. ? Sleepiness. ? Weakness. Summary  Bloody diarrhea is frequent loose and watery bowel movements that contain blood. The blood can be hard to see or notice (occult).  Follow the instructions from your health care provider about treating the cause of your bloody diarrhea.  Any type of diarrhea can make you feel weak and dehydrated.  Follow your health care provider's recommendations for eating and drinking and for taking  medicines.  Contact your health care provider if your symptoms get worse. Get help right away if you have signs of dehydration. This information is not intended to replace advice given to you by your health care provider. Make sure you discuss any questions you have with your health care provider. Document Revised: 09/06/2017 Document Reviewed: 09/06/2017 Elsevier Patient Education  [employer].

## 2020-03-18 NOTE — ED Notes (Signed)
Pt able to move through orthostatic positions without complaints of dizziness

## 2020-03-18 NOTE — ED Triage Notes (Signed)
Patient reports that she began having vomiting, bright blood in the stool and abdominal pain x 2 days.

## 2020-03-18 NOTE — Discharge Instructions (Signed)
As discussed, today's evaluation has been generally reassuring. There are some suspicion for infection or inflammation contributing to your nausea, vomiting, and bloody stool.  Please take all medication as directed and be sure to follow-up with your gastroenterologist.  Return here for concerning changes in your condition.

## 2020-03-18 NOTE — ED Provider Notes (Signed)
[facility] DEPT Provider Note   CSN: [id number] Arrival date & time: 03/18/20  1433     History Chief Complaint  Patient presents with  . Abdominal Pain  . Emesis  . Blood In [place] is a 64 y.o. female.  HPI Patient presents with concern of bloody stool, crampy abdominal pain, and emesis that has resolved. She notes several prior similar episodes, typically after food ingestion.  She has not had prior physician evaluation after these, however. This illness began 3 days ago.  After eating seafood.  Her husband did not suffer similar illness, but the day following that meal the patient developed abdominal pain, crampy, diffuse, with emesis, bloody diarrhea.  No vomiting in almost 24 hours, though she continues to have some bloody stool. Abdominal pain has mostly resolved, no fever, no confusion, no chest pain, no dyspnea, no medication taken for relief. Today after describing her illness to her physician she was sent here for evaluation.  Notably, the patient had an episode of finding stool in her vaginal vault once about a month ago.  This happened once and there is no abdominal pain, but with her findings as she is our gastroenterologist, had CT scan performed.  This reportedly was unremarkable. She is scheduled for follow-up with upper GI swallow study in about 1 week due to demonstrated hiatal hernia on prior imaging.   Past Medical History:  Diagnosis Date  . Anxiety   . Depression   . HLD (hyperlipidemia)   . HTN (hypertension)   . Hx: UTI (urinary tract infection)   . Internal hemorrhoids 11/13/08  . Migraine headache 05/07/2011   h/o atypical migraines  . Rosacea   . Vertigo     Patient Active Problem List   Diagnosis Date Noted  . Belching 01/14/2018  . Epigastric pain 01/14/2018  . Right Achilles tendinitis 01/19/2017  . Stress incontinence 12/23/2014  . Peripheral neuropathy 12/17/2013  . Rosacea   . HLD  (hyperlipidemia)   . HTN (hypertension)   . Depression   . ? Hernia of abdominal wall - epigastrium 10/17/2010    Class: Question of  . Heartburn 10/17/2010  . Overweight (BMI 25.0-29.9) 10/17/2010    Past Surgical History:  Procedure Laterality Date  . COLONOSCOPY  11/13/2008   internal hemrrhoids     OB History   No obstetric history on file.     Family History  Problem Relation Age of Onset  . COPD Mother   . Heart disease Father   . Colon cancer Neg Hx   . Colon polyps Neg Hx   . Esophageal cancer Neg Hx   . Stomach cancer Neg Hx   . Rectal cancer Neg Hx     Social History   Tobacco Use  . Smoking status: Former Smoker    Packs/day: 0.50    Years: 4.00    Pack years: 2.00    Types: Cigarettes    Quit date: 07/09/1976    Years since quitting: 43.7  . Smokeless tobacco: Never Used  Vaping Use  . Vaping Use: Never used  Substance Use Topics  . Alcohol use: Yes    Alcohol/week: 1.0 standard drink    Types: 1 Standard drinks or equivalent per week    Comment: 1-2 per month   . Drug use: No    Home Medications Prior to Admission medications   Medication Sig Start Date End Date Taking? Authorizing Provider  aspirin 81 MG tablet  Take 81 mg by mouth daily.     Yes [provider]  atorvastatin (LIPITOR) 40 MG tablet Take 1 tablet (40 mg total) by mouth daily. 05/21/19  Yes Wendie Agreste, MD  b complex vitamins tablet Take 1 tablet by mouth daily.   Yes [provider]  cholecalciferol (VITAMIN D) 400 UNITS TABS tablet Take 400 Units by mouth.   Yes [provider]  Cholecalciferol (VITAMIN D3 IMMUNE HEALTH PO) Take 1 tablet by mouth daily. Immune health gummies   Yes [provider]  famotidine (PEPCID) 10 MG tablet Take 10 mg by mouth as needed for heartburn (acid reflux).   Yes [provider]  FLUoxetine (PROZAC) 20 MG capsule Take 1 capsule (20 mg total) by mouth daily. 06/13/19  Yes Wendie Agreste, MD   losartan-hydrochlorothiazide (HYZAAR) 100-12.5 MG tablet Take 1 tablet by mouth daily. 05/21/19  Yes Wendie Agreste, MD  Omega-3 Fatty Acids (FISH OIL PO) Take 2 capsules by mouth daily.   Yes [provider]    Allergies    Amlodipine, Desipramine, Doxycycline, Lisinopril, and Sulfonamide derivatives  Review of Systems   Review of Systems  Constitutional:       Per HPI, otherwise negative  HENT:       Per HPI, otherwise negative  Respiratory:       Per HPI, otherwise negative  Cardiovascular:       Per HPI, otherwise negative  Gastrointestinal: Positive for abdominal pain, blood in stool, diarrhea, nausea and vomiting.  Endocrine:       Negative aside from HPI  Genitourinary:       Neg aside from HPI   Musculoskeletal:       Per HPI, otherwise negative  Skin: Negative.   Neurological: Negative for syncope.    Physical Exam Updated Vital Signs BP (!) 111/54   Pulse 87   Temp 98.5 F (36.9 C) (Oral)   Resp 17   Ht 5' 6.5" (1.689 m)   Wt 74.8 kg   SpO2 93%   BMI 26.23 kg/m   Physical Exam Vitals and nursing note reviewed.  Constitutional:      General: She is not in acute distress.    Appearance: She is well-developed and well-nourished.  HENT:     Head: Normocephalic and atraumatic.  Eyes:     Extraocular Movements: EOM normal.     Conjunctiva/sclera: Conjunctivae normal.  Cardiovascular:     Rate and Rhythm: Normal rate and regular rhythm.  Pulmonary:     Effort: Pulmonary effort is normal. No respiratory distress.     Breath sounds: Normal breath sounds. No stridor.  Abdominal:     General: There is no distension.     Comments: Minimally tender, mostly along the midline, nonperitoneal  Musculoskeletal:        General: No edema.  Skin:    General: Skin is warm and dry.  Neurological:     Mental Status: She is alert and oriented to person, place, and time.     Cranial Nerves: No cranial nerve deficit.  Psychiatric:        Mood and Affect:  Mood and affect normal.      ED Results / Procedures / Treatments   Labs (all labs ordered are listed, but only abnormal results are displayed) Labs Reviewed  COMPREHENSIVE METABOLIC PANEL - Abnormal; Notable for the following components:      Result Value   Glucose, Bld 103 (*)    All  other components within normal limits  CBC - Abnormal; Notable for the following components:   WBC 18.3 (*)    All other components within normal limits  LACTIC ACID, PLASMA  TYPE AND SCREEN  ABO/RH    EKG None  Radiology I reviewed the patient's CT scan from end of last month, reassuring, no evidence for fistula or other acute pathology.  Procedures Procedures (including critical care time)  Medications Ordered in ED Medications  sodium chloride 0.9 % bolus 1,000 mL (has no administration in time range)  acetaminophen (TYLENOL) tablet 650 mg (650 mg Oral Given 03/18/20 1502)    ED Course  I have reviewed the triage vital signs and the nursing notes.  Pertinent labs & imaging results that were available during my care of the patient were reviewed by me and considered in my medical decision making (see chart for details).     On repeat exam the patient is in no distress has had no additional vomiting, seemingly no bowel movements, has no new complaints. She, her husband and I discussed all findings including reassuring labs, CT scan was performed a few weeks ago that was reassuring, and reassuring vital signs. We discussed possibilities for her illness including foodborne pathogen, versus occult diverticulitis, lower suspicion of fistula or abscess. With resolution of vomiting, patient has a preference for trial of antibiotics, follow-up with GI rather than additional CT imaging currently. This is reasonable given reassuring findings as above, patient discharged in stable condition.  MDM Rules/Calculators/A&P MDM Number of Diagnoses or Management Options Gastroenteritis: new, needed  workup   Amount and/or Complexity of Data Reviewed Clinical lab tests: reviewed Tests in the radiology section of CPT: reviewed Tests in the medicine section of CPT: reviewed Decide to obtain previous medical records or to obtain history from someone other than the patient: yes Obtain history from someone other than the patient: yes Review and summarize past medical records: yes Independent visualization of images, tracings, or specimens: yes  Risk of Complications, Morbidity, and/or Mortality Presenting problems: high Diagnostic procedures: high Management options: high  Critical Care Total time providing critical care: < 30 minutes  Patient Progress Patient progress: stable  Final Clinical Impression(s) / ED Diagnoses Final diagnoses:  Gastroenteritis    Rx / DC Orders ED Discharge Orders         Ordered    ciprofloxacin (CIPRO) 500 MG tablet  Every 12 hours        03/18/20 1725    metroNIDAZOLE (FLAGYL) 500 MG tablet  2 times daily        03/18/20 1725           Carmin Muskrat, MD 03/18/20 2312

## 2020-03-18 NOTE — Progress Notes (Signed)
Virtual Visit Note  I connected with patient on 03/18/20 at 1355 by telephone due to unable to work Epic video visit and verified that I am speaking with the correct person using two identifiers. Michele Lowery is currently located at home and no family members are currently with them during visit. The provider, Michele Quint Deshea Pooley, FNP is located in their office at time of visit.  I discussed the limitations, risks, security and privacy concerns of performing an evaluation and management service by telephone and the availability of in person appointments. I also discussed with the patient that there may be a patient responsible charge related to this service. The patient expressed understanding and agreed to proceed.   I provided 20 minutes of non-face-to-face time during this encounter.  Chief Complaint  Patient presents with  . Blood In Stools    Vomiting started Tuesday, bloody stool next morning , reoccurring 3 to 4 times over last couple of yrs ate shrimp, oysters . A fever of 100 today, drinking little , hx of mod hietial hernia    HPI ? Tuesday night Concerned she had food poisoning, due to this happening in the past Started throwing up and had diarrhea Bloody stool noted Blood is clotting but bright red Stopped throwing up wed morning Stomach cramping and blood comes up Colonoscopy last year 03/2019 no abnormalities seen CT scan last week due to potential recto-vaginal fistula: no fistula found. moderate hiatal hernia. Recently had Stool coming out of her vagina on 11/11 for which she saw Dr. Carlean Purl   Allergies  Allergen Reactions  . Amlodipine   . Desipramine   . Doxycycline Nausea And Vomiting    REACTION: nausea and vomiting  . Lisinopril Cough  . Sulfonamide Derivatives     REACTION: "i can't remember the reaction"    Prior to Admission medications   Medication Sig Start Date End Date Taking? Authorizing Provider  aspirin 81 MG tablet Take 81 mg by mouth  daily.     Yes [provider]  atorvastatin (LIPITOR) 40 MG tablet Take 1 tablet (40 mg total) by mouth daily. 05/21/19  Yes Wendie Agreste, MD  b complex vitamins tablet Take 1 tablet by mouth daily.   Yes [provider]  cholecalciferol (VITAMIN D) 400 UNITS TABS tablet Take 400 Units by mouth.   Yes [provider]  famotidine (PEPCID) 10 MG tablet Take 10 mg by mouth 2 (two) times daily.   Yes [provider]  FLUoxetine (PROZAC) 20 MG capsule Take 1 capsule (20 mg total) by mouth daily. 06/13/19  Yes Wendie Agreste, MD  losartan-hydrochlorothiazide (HYZAAR) 100-12.5 MG tablet Take 1 tablet by mouth daily. 05/21/19  Yes Wendie Agreste, MD  Omega-3 Fatty Acids (FISH OIL PO) Take 4 tablets by mouth daily.   Yes [provider]    Past Medical History:  Diagnosis Date  . Anxiety   . Depression   . HLD (hyperlipidemia)   . HTN (hypertension)   . Hx: UTI (urinary tract infection)   . Internal hemorrhoids 11/13/08  . Migraine headache 05/07/2011   h/o atypical migraines  . Rosacea   . Vertigo     Past Surgical History:  Procedure Laterality Date  . COLONOSCOPY  11/13/2008   internal hemrrhoids    Social History   Tobacco Use  . Smoking status: Former Smoker    Packs/day: 0.50    Years: 4.00    Pack years: 2.00    Types:  Cigarettes    Quit date: 07/09/1976    Years since quitting: 43.7  . Smokeless tobacco: Never Used  Substance Use Topics  . Alcohol use: Yes    Alcohol/week: 1.0 standard drink    Types: 1 Standard drinks or equivalent per week    Comment: 1-2 per month     Family History  Problem Relation Age of Onset  . COPD Mother   . Heart disease Father   . Colon cancer Neg Hx   . Colon polyps Neg Hx   . Esophageal cancer Neg Hx   . Stomach cancer Neg Hx   . Rectal cancer Neg Hx     Review of Systems  Constitutional: Positive for fever and malaise/fatigue.  Gastrointestinal: Positive for abdominal pain, blood  in stool, diarrhea and vomiting. Negative for nausea.    Objective  Constitutional:      General: She is not in acute distress.    Appearance: Normal appearance. She is not ill-appearing.   Pulmonary:     Effort: Pulmonary effort is normal. No respiratory distress.  Neurological:     Mental Status: She is alert and oriented to person, place, and time.  Psychiatric:        Mood and Affect: Mood normal.        Behavior: Behavior normal.     ASSESSMENT and PLAN  Problem List Items Addressed This Visit   None   Visit Diagnoses    Bloody diarrhea    -  Primary  Encouraged to go to the ED immediately Due to bloody stools, vomiting, febrile and recent stool coming from vagina Recent CT was negative, more thorough workup needs to be pursued.       Return in about 2 weeks (around 04/01/2020) for ED follow up.    The above assessment and management plan was discussed with the patient. The patient verbalized understanding of and has agreed to the management plan. Patient is aware to call the clinic if symptoms persist or worsen. Patient is aware when to return to the clinic for a follow-up visit. Patient educated on when it is appropriate to go to the emergency department.     Huston Foley Manuel Lawhead, FNP-BC [facility] at [place],  [address] Ph.  [phone] Fax [phone]

## 2020-03-22 ENCOUNTER — Telehealth: Payer: Self-pay | Admitting: *Deleted

## 2020-03-26 ENCOUNTER — Ambulatory Visit (HOSPITAL_COMMUNITY)
Admission: RE | Admit: 2020-03-26 | Discharge: 2020-03-26 | Disposition: A | Discharge status: Home / Self Care | Payer: Medicare Other | Source: Ambulatory Visit | Attending: Internal Medicine | Admitting: Internal Medicine

## 2020-03-26 ENCOUNTER — Other Ambulatory Visit: Payer: Self-pay

## 2020-03-26 DIAGNOSIS — K219 Gastro-esophageal reflux disease without esophagitis: Secondary | ICD-10-CM | POA: Diagnosis not present

## 2020-03-26 DIAGNOSIS — K449 Diaphragmatic hernia without obstruction or gangrene: Secondary | ICD-10-CM

## 2020-04-06 NOTE — Telephone Encounter (Signed)
Schedule AWV.  

## 2020-04-10 ENCOUNTER — Other Ambulatory Visit: Payer: Self-pay | Admitting: Family Medicine

## 2020-04-12 ENCOUNTER — Ambulatory Visit: Payer: Medicare Other | Admitting: Family Medicine

## 2020-05-21 ENCOUNTER — Other Ambulatory Visit: Payer: Self-pay | Admitting: Family Medicine

## 2020-05-21 DIAGNOSIS — E785 Hyperlipidemia, unspecified: Secondary | ICD-10-CM

## 2020-06-28 ENCOUNTER — Other Ambulatory Visit: Payer: Self-pay | Admitting: Family Medicine

## 2020-06-28 DIAGNOSIS — Z1231 Encounter for screening mammogram for malignant neoplasm of breast: Secondary | ICD-10-CM

## 2020-07-09 ENCOUNTER — Other Ambulatory Visit: Payer: Self-pay | Admitting: Family Medicine

## 2020-07-09 DIAGNOSIS — I10 Essential (primary) hypertension: Secondary | ICD-10-CM

## 2020-07-09 NOTE — Telephone Encounter (Signed)
   Notes to clinic Not a provider we approve rx for.  

## 2020-08-05 MED ORDER — LOSARTAN POTASSIUM-HCTZ 100-12.5 MG PO TABS: 1.0000 | ORAL_TABLET | Freq: Every day | ORAL | 0 refills | Status: DC

## 2020-08-05 NOTE — Addendum Note (Signed)
Addended by: [name] R on: 08/05/2020 07:07 PM   Modules accepted: Orders

## 2020-08-20 ENCOUNTER — Ambulatory Visit
Admission: RE | Admit: 2020-08-20 | Discharge: 2020-08-20 | Disposition: A | Discharge status: Home / Self Care | Payer: Medicare Other | Source: Ambulatory Visit | Attending: Family Medicine | Admitting: Family Medicine

## 2020-08-20 ENCOUNTER — Other Ambulatory Visit: Payer: Self-pay

## 2020-08-20 DIAGNOSIS — Z1231 Encounter for screening mammogram for malignant neoplasm of breast: Secondary | ICD-10-CM

## 2020-08-23 ENCOUNTER — Other Ambulatory Visit: Payer: Self-pay | Admitting: Family Medicine

## 2020-08-23 DIAGNOSIS — E785 Hyperlipidemia, unspecified: Secondary | ICD-10-CM

## 2020-09-04 ENCOUNTER — Other Ambulatory Visit: Payer: Self-pay | Admitting: Family Medicine

## 2020-09-04 DIAGNOSIS — I10 Essential (primary) hypertension: Secondary | ICD-10-CM

## 2020-09-05 ENCOUNTER — Other Ambulatory Visit: Payer: Self-pay | Admitting: Family Medicine

## 2020-09-05 DIAGNOSIS — E785 Hyperlipidemia, unspecified: Secondary | ICD-10-CM

## 2020-09-23 ENCOUNTER — Other Ambulatory Visit: Payer: Self-pay | Admitting: Family Medicine

## 2020-09-23 DIAGNOSIS — I10 Essential (primary) hypertension: Secondary | ICD-10-CM

## 2020-10-04 ENCOUNTER — Other Ambulatory Visit: Payer: Self-pay | Admitting: Family Medicine

## 2020-10-09 ENCOUNTER — Other Ambulatory Visit: Payer: Self-pay | Admitting: Family Medicine

## 2020-10-09 DIAGNOSIS — I10 Essential (primary) hypertension: Secondary | ICD-10-CM

## 2020-10-22 ENCOUNTER — Other Ambulatory Visit: Payer: Self-pay | Admitting: Family Medicine

## 2020-10-22 DIAGNOSIS — E785 Hyperlipidemia, unspecified: Secondary | ICD-10-CM

## 2020-10-27 ENCOUNTER — Other Ambulatory Visit: Payer: Self-pay

## 2020-10-27 ENCOUNTER — Ambulatory Visit
Admission: EM | Admit: 2020-10-27 | Discharge: 2020-10-27 | Disposition: A | Discharge status: Home / Self Care | Payer: Medicare Other | Attending: Emergency Medicine | Admitting: Emergency Medicine

## 2020-10-27 DIAGNOSIS — E785 Hyperlipidemia, unspecified: Secondary | ICD-10-CM | POA: Diagnosis not present

## 2020-10-27 DIAGNOSIS — E782 Mixed hyperlipidemia: Secondary | ICD-10-CM | POA: Diagnosis not present

## 2020-10-27 DIAGNOSIS — I10 Essential (primary) hypertension: Secondary | ICD-10-CM

## 2020-10-27 DIAGNOSIS — Z760 Encounter for issue of repeat prescription: Secondary | ICD-10-CM | POA: Diagnosis not present

## 2020-10-27 MED ORDER — LOSARTAN POTASSIUM-HCTZ 100-12.5 MG PO TABS: 1.0000 | ORAL_TABLET | Freq: Every day | ORAL | 0 refills | Status: DC

## 2020-10-27 MED ORDER — ATORVASTATIN CALCIUM 40 MG PO TABS: 40.0000 mg | ORAL_TABLET | Freq: Every day | ORAL | 0 refills | Status: DC

## 2020-10-27 MED ORDER — FLUOXETINE HCL 20 MG PO CAPS: 20.0000 mg | ORAL_CAPSULE | Freq: Every day | ORAL | 0 refills | Status: DC

## 2020-10-27 NOTE — Discharge Instructions (Addendum)
I have refilled your Lipitor, Prozac and losartan/HCTZ Please establish care with primary care Blood work pending-I will call if abnormal

## 2020-10-27 NOTE — ED Provider Notes (Signed)
[facility]    CSN: [id number] Arrival date & time: 10/27/20  1506      History   Chief Complaint No chief complaint on file. Medication refill  HPI Michele Lowery is a 65 y.o. female history of hypertension, migraines presenting today for evaluation of medication refill.  Patient is in between primary care's right now and is needing refill of Lipitor, Prozac and losartan/HCTZ.  She has been on these medicines for a while and denies any problems.  Denies any symptoms.  She is run out of Lipitor, but does have a few tablets left of her fluoxetine and blood pressure meds.  Last blood work/labs from December 2021.   HPI  Past Medical History:  Diagnosis Date   Anxiety    Depression    HLD (hyperlipidemia)    HTN (hypertension)    Hx: UTI (urinary tract infection)    Internal hemorrhoids 11/13/08   Migraine headache 05/07/2011   h/o atypical migraines   Rosacea    Vertigo     Patient Active Problem List   Diagnosis Date Noted   Belching 01/14/2018   Epigastric pain 01/14/2018   Right Achilles tendinitis 01/19/2017   Stress incontinence 12/23/2014   Peripheral neuropathy 12/17/2013   Rosacea    HLD (hyperlipidemia)    HTN (hypertension)    Depression    ? Hernia of abdominal wall - epigastrium 10/17/2010    Class: Question of   Heartburn 10/17/2010   Overweight (BMI 25.0-29.9) 10/17/2010    Past Surgical History:  Procedure Laterality Date   COLONOSCOPY  11/13/2008   internal hemrrhoids    OB History   No obstetric history on file.      Home Medications    Prior to Admission medications   Medication Sig Start Date End Date Taking? Authorizing Provider  aspirin 81 MG tablet Take 81 mg by mouth daily.     Yes [provider]  b complex vitamins tablet Take 1 tablet by mouth daily.   Yes [provider]  cholecalciferol (VITAMIN D) 400 UNITS TABS tablet Take 400 Units by mouth.   Yes [provider]  Cholecalciferol  (VITAMIN D3 IMMUNE HEALTH PO) Take 1 tablet by mouth daily. Immune health gummies   Yes [provider]  Omega-3 Fatty Acids (FISH OIL PO) Take 2 capsules by mouth daily.   Yes [provider]  atorvastatin (LIPITOR) 40 MG tablet Take 1 tablet (40 mg total) by mouth daily. 10/27/20   Alexxia Stankiewicz C, PA-C  FLUoxetine (PROZAC) 20 MG capsule Take 1 capsule (20 mg total) by mouth daily. 10/27/20   Valgene Deloatch C, PA-C  losartan-hydrochlorothiazide (HYZAAR) 100-12.5 MG tablet Take 1 tablet by mouth daily. 10/27/20   Baraa Tubbs, Elesa Hacker, PA-C    Family History Family History  Problem Relation Age of Onset   COPD Mother    Heart disease Father    Colon cancer Neg Hx    Colon polyps Neg Hx    Esophageal cancer Neg Hx    Stomach cancer Neg Hx    Rectal cancer Neg Hx     Social History Social History   Tobacco Use   Smoking status: Former    Packs/day: 0.50    Years: 4.00    Pack years: 2.00    Types: Cigarettes    Quit date: 07/09/1976    Years since quitting: 44.3   Smokeless tobacco: Never  Vaping Use   Vaping Use: Never used  Substance Use Topics  Alcohol use: Yes    Alcohol/week: 1.0 standard drink    Types: 1 Standard drinks or equivalent per week    Comment: 1-2 per month    Drug use: No     Allergies   Amlodipine, Desipramine, Doxycycline, Lisinopril, and Sulfonamide derivatives   Review of Systems Review of Systems  Constitutional:  Negative for fatigue and fever.  HENT:  Negative for congestion, sinus pressure and sore throat.   Eyes:  Negative for photophobia, pain and visual disturbance.  Respiratory:  Negative for cough and shortness of breath.   Cardiovascular:  Negative for chest pain.  Gastrointestinal:  Negative for abdominal pain, nausea and vomiting.  Genitourinary:  Negative for decreased urine volume and hematuria.  Musculoskeletal:  Negative for myalgias, neck pain and neck stiffness.  Neurological:  Negative for dizziness,  syncope, facial asymmetry, speech difficulty, weakness, light-headedness, numbness and headaches.    Physical Exam Triage Vital Signs ED Triage Vitals  Enc Vitals Group     BP      Pulse      Resp      Temp      Temp src      SpO2      Weight      Height      Head Circumference      Peak Flow      Pain Score      Pain Loc      Pain Edu?      Excl. in [place]?    No data found.  Updated Vital Signs BP 135/83   Pulse 66   Temp 98 F (36.7 C)   Resp 19   SpO2 95%   Visual Acuity Right Eye Distance:   Left Eye Distance:   Bilateral Distance:    Right Eye Near:   Left Eye Near:    Bilateral Near:     Physical Exam Vitals and nursing note reviewed.  Constitutional:      Appearance: She is well-developed.     Comments: No acute distress  HENT:     Head: Normocephalic and atraumatic.     Nose: Nose normal.  Eyes:     Extraocular Movements: Extraocular movements intact.     Conjunctiva/sclera: Conjunctivae normal.     Pupils: Pupils are equal, round, and reactive to light.  Cardiovascular:     Rate and Rhythm: Normal rate and regular rhythm.  Pulmonary:     Effort: Pulmonary effort is normal. No respiratory distress.     Comments: Breathing comfortably at rest, CTABL, no wheezing, rales or other adventitious sounds auscultated  Abdominal:     General: There is no distension.  Musculoskeletal:        General: Normal range of motion.     Cervical back: Neck supple.  Skin:    General: Skin is warm and dry.  Neurological:     General: No focal deficit present.     Mental Status: She is alert and oriented to person, place, and time.     UC Treatments / Results  Labs (all labs ordered are listed, but only abnormal results are displayed) Labs Reviewed  COMPREHENSIVE METABOLIC PANEL  CBC WITH DIFFERENTIAL/PLATELET    EKG   Radiology No results found.  Procedures Procedures (including critical care time)  Medications Ordered in UC Medications - No  data to display  Initial Impression / Assessment and Plan / UC Course  I have reviewed the triage vital signs and the nursing notes.  Pertinent  labs & imaging results that were available during my care of the patient were reviewed by me and considered in my medical decision making (see chart for details).     Refilled blood pressure medicine, Prozac and Lipitor.  Checking basic labs to check potassium, kidney function and liver function to ensure safety to continue these meds, encourage patient to establish care with PCP for further refills and monitoring/management of hypertension, hyperlipidemia and depression.  Discussed strict return precautions. Patient verbalized understanding and is agreeable with plan.  Final Clinical Impressions(s) / UC Diagnoses   Final diagnoses:  Medication refill  Essential hypertension  Primary hypertension  Mixed hyperlipidemia  Hyperlipidemia, unspecified hyperlipidemia type     Discharge Instructions      I have refilled your Lipitor, Prozac and losartan/HCTZ Please establish care with primary care Blood work pending-I will call if abnormal     ED Prescriptions     Medication Sig Dispense Auth. Provider   losartan-hydrochlorothiazide (HYZAAR) 100-12.5 MG tablet Take 1 tablet by mouth daily. 90 tablet Ronne Stefanski C, PA-C   FLUoxetine (PROZAC) 20 MG capsule Take 1 capsule (20 mg total) by mouth daily. 90 capsule Airel Magadan C, PA-C   atorvastatin (LIPITOR) 40 MG tablet Take 1 tablet (40 mg total) by mouth daily. 90 tablet Jerrelle Michelsen, [place] C, PA-C      PDMP not reviewed this encounter.   Janith Lima, [place] 10/27/20 1537

## 2020-10-27 NOTE — ED Triage Notes (Signed)
Pt presents with need for prescriptions to be refilled. Pt in the process of obtaining a new pcp.

## 2020-10-28 LAB — CBC WITH DIFFERENTIAL/PLATELET
Basophils Absolute: 0 10*3/uL (ref 0.0–0.2)
Basos: 1 %
EOS (ABSOLUTE): 0.1 10*3/uL (ref 0.0–0.4)
Eos: 1 %
Hematocrit: 43.4 % (ref 34.0–46.6)
Hemoglobin: 14.4 g/dL (ref 11.1–15.9)
Immature Grans (Abs): 0 10*3/uL (ref 0.0–0.1)
Immature Granulocytes: 0 %
Lymphocytes Absolute: 2.4 10*3/uL (ref 0.7–3.1)
Lymphs: 28 %
MCH: 28.9 pg (ref 26.6–33.0)
MCHC: 33.2 g/dL (ref 31.5–35.7)
MCV: 87 fL (ref 79–97)
Monocytes Absolute: 0.8 10*3/uL (ref 0.1–0.9)
Monocytes: 9 %
Neutrophils Absolute: 5.2 10*3/uL (ref 1.4–7.0)
Neutrophils: 61 %
Platelets: 326 10*3/uL (ref 150–450)
RBC: 4.99 x10E6/uL (ref 3.77–5.28)
RDW: 12.9 % (ref 11.7–15.4)
WBC: 8.5 10*3/uL (ref 3.4–10.8)

## 2020-10-28 LAB — COMPREHENSIVE METABOLIC PANEL
ALT: 18 IU/L (ref 0–32)
AST: 18 IU/L (ref 0–40)
Albumin/Globulin Ratio: 2.6 — ABNORMAL HIGH (ref 1.2–2.2)
Albumin: 4.9 g/dL — ABNORMAL HIGH (ref 3.8–4.8)
Alkaline Phosphatase: 95 IU/L (ref 44–121)
BUN/Creatinine Ratio: 19 (ref 12–28)
BUN: 16 mg/dL (ref 8–27)
Bilirubin Total: 0.3 mg/dL (ref 0.0–1.2)
CO2: 23 mmol/L (ref 20–29)
Calcium: 10.1 mg/dL (ref 8.7–10.3)
Chloride: 100 mmol/L (ref 96–106)
Creatinine, Ser: 0.84 mg/dL (ref 0.57–1.00)
Globulin, Total: 1.9 g/dL (ref 1.5–4.5)
Glucose: 97 mg/dL (ref 65–99)
Potassium: 4.2 mmol/L (ref 3.5–5.2)
Sodium: 139 mmol/L (ref 134–144)
Total Protein: 6.8 g/dL (ref 6.0–8.5)
eGFR: 77 mL/min/{1.73_m2} (ref >59)

## 2020-11-04 ENCOUNTER — Other Ambulatory Visit: Payer: Self-pay | Admitting: Family Medicine

## 2020-11-04 DIAGNOSIS — I10 Essential (primary) hypertension: Secondary | ICD-10-CM

## 2020-11-07 ENCOUNTER — Other Ambulatory Visit: Payer: Self-pay | Admitting: Family Medicine

## 2020-11-07 DIAGNOSIS — E785 Hyperlipidemia, unspecified: Secondary | ICD-10-CM

## 2021-05-26 ENCOUNTER — Ambulatory Visit
Admission: EM | Admit: 2021-05-26 | Discharge: 2021-05-26 | Disposition: A | Discharge status: Home / Self Care | Payer: Medicare Other | Attending: Emergency Medicine | Admitting: Emergency Medicine

## 2021-05-26 ENCOUNTER — Other Ambulatory Visit: Payer: Self-pay

## 2021-05-26 DIAGNOSIS — Z760 Encounter for issue of repeat prescription: Secondary | ICD-10-CM | POA: Diagnosis not present

## 2021-05-26 DIAGNOSIS — I10 Essential (primary) hypertension: Secondary | ICD-10-CM

## 2021-05-26 DIAGNOSIS — E785 Hyperlipidemia, unspecified: Secondary | ICD-10-CM

## 2021-05-26 MED ORDER — FLUOXETINE HCL 20 MG PO CAPS: 20.0000 mg | ORAL_CAPSULE | Freq: Every day | ORAL | 0 refills | Status: DC

## 2021-05-26 MED ORDER — ATORVASTATIN CALCIUM 40 MG PO TABS: 40.0000 mg | ORAL_TABLET | Freq: Every day | ORAL | 0 refills | Status: DC

## 2021-05-26 MED ORDER — LOSARTAN POTASSIUM-HCTZ 100-12.5 MG PO TABS: 1.0000 | ORAL_TABLET | Freq: Every day | ORAL | 0 refills | Status: DC

## 2021-05-26 NOTE — ED Provider Notes (Signed)
[facility]  ____________________________________________  Time seen: Approximately 1:55 PM  I have reviewed the triage vital signs and the nursing notes.   HISTORY  Chief Complaint Medication Refill   Historian Patient     HPI Michele Lowery is a 66 y.o. female with a history of hypertension, hyperlipidemia and depression presents to the urgent care for a 90-day supply of Lipitor, Prozac and HCTZ.  Patient states that she attempted to establish care with a primary care provider and they canceled her appointment today.   Past Medical History:  Diagnosis Date   Anxiety    Depression    HLD (hyperlipidemia)    HTN (hypertension)    Hx: UTI (urinary tract infection)    Internal hemorrhoids 11/13/08   Migraine headache 05/07/2011   h/o atypical migraines   Rosacea    Vertigo      Immunizations up to date:  Yes.     Past Medical History:  Diagnosis Date   Anxiety    Depression    HLD (hyperlipidemia)    HTN (hypertension)    Hx: UTI (urinary tract infection)    Internal hemorrhoids 11/13/08   Migraine headache 05/07/2011   h/o atypical migraines   Rosacea    Vertigo     Patient Active Problem List   Diagnosis Date Noted   Belching 01/14/2018   Epigastric pain 01/14/2018   Right Achilles tendinitis 01/19/2017   Stress incontinence 12/23/2014   Peripheral neuropathy 12/17/2013   Rosacea    HLD (hyperlipidemia)    HTN (hypertension)    Depression    ? Hernia of abdominal wall - epigastrium 10/17/2010    Class: Question of   Heartburn 10/17/2010   Overweight (BMI 25.0-29.9) 10/17/2010    Past Surgical History:  Procedure Laterality Date   COLONOSCOPY  11/13/2008   internal hemrrhoids    Prior to Admission medications   Medication Sig Start Date End Date Taking? Authorizing Provider  aspirin 81 MG tablet Take 81 mg by mouth daily.      [provider]  atorvastatin (LIPITOR) 40 MG tablet Take 1 tablet (40 mg total) by mouth  daily. 05/26/21   Lannie Fields, PA-C  b complex vitamins tablet Take 1 tablet by mouth daily.    [provider]  cholecalciferol (VITAMIN D) 400 UNITS TABS tablet Take 400 Units by mouth.    [provider]  Cholecalciferol (VITAMIN D3 IMMUNE HEALTH PO) Take 1 tablet by mouth daily. Immune health gummies    [provider]  FLUoxetine (PROZAC) 20 MG capsule Take 1 capsule (20 mg total) by mouth daily. 05/26/21   Lannie Fields, PA-C  losartan-hydrochlorothiazide (HYZAAR) 100-12.5 MG tablet Take 1 tablet by mouth daily. 05/26/21   Lannie Fields, PA-C  Omega-3 Fatty Acids (FISH OIL PO) Take 2 capsules by mouth daily.    [provider]    Allergies Amlodipine, Desipramine, Doxycycline, Lisinopril, and Sulfonamide derivatives  Family History  Problem Relation Age of Onset   COPD Mother    Heart disease Father    Colon cancer Neg Hx    Colon polyps Neg Hx    Esophageal cancer Neg Hx    Stomach cancer Neg Hx    Rectal cancer Neg Hx     Social History Social History   Tobacco Use   Smoking status: Former    Packs/day: 0.50    Years: 4.00    Pack years: 2.00    Types: Cigarettes  Quit date: 07/09/1976    Years since quitting: 44.9   Smokeless tobacco: Never  Vaping Use   Vaping Use: Never used  Substance Use Topics   Alcohol use: Yes    Alcohol/week: 1.0 standard drink    Types: 1 Standard drinks or equivalent per week    Comment: 1-2 per month    Drug use: No     Review of Systems  Constitutional: No fever/chills Eyes:  No discharge ENT: No upper respiratory complaints. Respiratory: no cough. No SOB/ use of accessory muscles to breath Gastrointestinal:   No nausea, no vomiting.  No diarrhea.  No constipation. Musculoskeletal: Negative for musculoskeletal pain. Skin: Negative for rash, abrasions, lacerations, ecchymosis.    ____________________________________________   PHYSICAL EXAM:  VITAL SIGNS: ED Triage Vitals  Enc  Vitals Group     BP 05/26/21 1237 (!) 143/78     Pulse Rate 05/26/21 1237 70     Resp 05/26/21 1237 16     Temp 05/26/21 1237 98.2 F (36.8 C)     Temp Source 05/26/21 1237 Oral     SpO2 05/26/21 1237 95 %     Weight --      Height --      Head Circumference --      Peak Flow --      Pain Score 05/26/21 1243 0     Pain Loc --      Pain Edu? --      Excl. in [place]? --      Constitutional: Alert and oriented. Well appearing and in no acute distress. Eyes: Conjunctivae are normal. PERRL. EOMI. Head: Atraumatic. ENT:      Nose: No congestion/rhinnorhea.      Mouth/Throat: Mucous membranes are moist.  Neck: No stridor.  No cervical spine tenderness to palpation. Cardiovascular: Normal rate, regular rhythm. Normal S1 and S2.  Good peripheral circulation. Respiratory: Normal respiratory effort without tachypnea or retractions. Lungs CTAB. Good air entry to the bases with no decreased or absent breath sounds Gastrointestinal: Bowel sounds x 4 quadrants. Soft and nontender to palpation. No guarding or rigidity. No distention. Musculoskeletal: Full range of motion to all extremities. No obvious deformities noted Neurologic:  Normal for age. No gross focal neurologic deficits are appreciated.  Skin:  Skin is warm, dry and intact. No rash noted. Psychiatric: Mood and affect are normal for age. Speech and behavior are normal.   ____________________________________________   LABS (all labs ordered are listed, but only abnormal results are displayed)  Labs Reviewed - No data to display ____________________________________________  EKG   ____________________________________________  RADIOLOGY   No results found.  ____________________________________________    PROCEDURES  Procedure(s) performed:     Procedures     Medications - No data to display   ____________________________________________   INITIAL IMPRESSION / ASSESSMENT AND PLAN / ED COURSE  Pertinent  labs & imaging results that were available during my care of the patient were reviewed by me and considered in my medical decision making (see chart for details).      Assessment and plan Medication refill 66 year old female presents to the urgent care for a medication refill of her Prozac, Lipitor and hydrochlorothiazide.  Vital signs are reassuring at triage.  On physical exam, patient was alert, active and nontoxic-appearing.  Explained to patient that I would refill her medications as a 30-day supply but we could no longer prescribe her 90-day supplies of her medication.  I made patient an appointment with a primary care  provider to the [facility] health website during today's encounter.     ____________________________________________  FINAL CLINICAL IMPRESSION(S) / ED DIAGNOSES  Final diagnoses:  Medication refill      NEW MEDICATIONS STARTED DURING THIS VISIT:  ED Discharge Orders          Ordered    atorvastatin (LIPITOR) 40 MG tablet  Daily       Note to Pharmacy: [place] refill, must schedule office visit prior to further refills.   05/26/21 1302    FLUoxetine (PROZAC) 20 MG capsule  Daily       Note to Pharmacy: Requesting 1 year supply   05/26/21 1302    losartan-hydrochlorothiazide (HYZAAR) 100-12.5 MG tablet  Daily       Note to Pharmacy: Patient needs an appointment for continued refills.   05/26/21 1302                This chart was dictated using voice recognition software/Dragon. Despite best efforts to proofread, errors can occur which can change the meaning. Any change was purely unintentional.     Lannie Fields, PA-C 05/26/21 1357

## 2021-05-26 NOTE — ED Triage Notes (Signed)
Pt here for medication refill (PCP cancelled appt today).   Medications: Prozac Lipitor Hyzaar

## 2021-06-07 ENCOUNTER — Ambulatory Visit: Payer: Medicare Other | Admitting: Nurse Practitioner

## 2021-07-07 ENCOUNTER — Ambulatory Visit: Payer: Medicare Other | Admitting: Internal Medicine

## 2021-07-07 ENCOUNTER — Encounter: Payer: Self-pay | Admitting: Internal Medicine

## 2021-07-07 VITALS — BP 123/70 | HR 72 | Ht 66.0 in | Wt 173.0 lb

## 2021-07-07 DIAGNOSIS — K219 Gastro-esophageal reflux disease without esophagitis: Secondary | ICD-10-CM | POA: Diagnosis not present

## 2021-07-07 DIAGNOSIS — K449 Diaphragmatic hernia without obstruction or gangrene: Secondary | ICD-10-CM

## 2021-07-07 NOTE — Patient Instructions (Signed)
You have been scheduled for an endoscopy. Please follow written instructions given to you at your visit today. ?If you use inhalers (even only as needed), please bring them with you on the day of your procedure. ? ?Try to wait greater than 3 hours before lying down after eating. ? ?Try 2 over the counter pepcid at bedtime daily. ? ?We have provided you with diet info to read and follow. ? ? ?I appreciate the opportunity to care for you. ?Silvano Rusk, MD, [facility] ?

## 2021-07-07 NOTE — Progress Notes (Signed)
? ?Michele Lowery 66 y.o. [birth year] [id number] ? ?Assessment & Plan:  ? ?Encounter Diagnoses  ?Name Primary?  ? Gastroesophageal reflux disease, unspecified whether esophagitis present Yes  ? Hiatal hernia   ? ? ?Schedule EGD to evaluate GERD symptoms and hiatal hernia.  Further plans pending that in the meantime she can take 20 mg of Pepcid at bedtime and continue to work on lifestyle changes notably try to get more than 3 hours between last meal and lying down. ? ?We had a discussion about healthy eating for metabolic health and she will try to eat lower carbohydrate foods and probiotic foods.  Low-carb eating handout provided. ? ?Discussed how surgery is sometimes used for hiatal hernia we are not going there quickly but it is something to think about depending upon what is seen an EGD and clinical course. ? ?Subjective:  ? ?Chief Complaint: Hiatal hernia and GERD ? ?HPI ?Michele Lowery is a 66 year old white woman with a history of rosacea, depression and anxiety, migraines, and who was found to have a hiatal hernia by CT scan last year when she was having an evaluation for possible colovaginal fistula (not present).  When we call those results she indicated she been having a lot of burping and belching over the years and she has been treated off and on with PPI for heartburn issues per tickly nocturnal at times.  She is continue to have the symptoms particularly at night and sometimes she will regurgitate and almost aspirate or at least have coughing.  She has been taking 10 mg chewable Pepcid at bedtime a lot.  She has a head of bed elevated though she seems to be sliding down in the bed some.  There is minimal caffeine intake minimal sugar intake and she uses just a little bit of artificial sweetener in her coffee in the morning.  No dysphagia.  She says she can feel the hiatal hernia at times when she swallows with a little bit of pressure in the epigastric area, she thinks. ? ?Barium swallow/upper GI was  done after the CT scan and it showed a moderate hiatal hernia.  We reviewed those images.  It does not appear to be sliding.  Question if it might be slightly paraesophageal. ? ?She continues to have an intermittent right upper quadrant painful area that sounds like it could be a hernia though we have not found that through CT scanning. ?Allergies  ?Allergen Reactions  ? Amlodipine Other (See Comments)  ?  Pt does not recall  ? Desipramine Other (See Comments)  ?  Pt does not recall   ? Doxycycline Nausea And Vomiting  ? Lisinopril Cough  ? Sulfonamide Derivatives Other (See Comments)  ?  "i can't remember the reaction"  ? ?Current Meds  ?Medication Sig  ? aspirin 81 MG tablet Take 81 mg by mouth daily.    ? atorvastatin (LIPITOR) 40 MG tablet Take 1 tablet (40 mg total) by mouth daily.  ? b complex vitamins tablet Take 1 tablet by mouth daily.  ? cholecalciferol (VITAMIN D) 400 UNITS TABS tablet Take 400 Units by mouth.  ? famotidine (PEPCID) 10 MG tablet Take 10 mg by mouth at bedtime.  ? FLUoxetine (PROZAC) 20 MG capsule Take 1 capsule (20 mg total) by mouth daily.  ? losartan-hydrochlorothiazide (HYZAAR) 100-12.5 MG tablet Take 1 tablet by mouth daily.  ? Omega-3 Fatty Acids (FISH OIL PO) Take 2 capsules by mouth daily.  ? ?Past Medical History:  ?Diagnosis Date  ?  Anxiety   ? Depression   ? HLD (hyperlipidemia)   ? HTN (hypertension)   ? Hx: UTI (urinary tract infection)   ? Internal hemorrhoids 11/13/08  ? Migraine headache 05/07/2011  ? h/o atypical migraines  ? Rosacea   ? Vertigo   ? ?Past Surgical History:  ?Procedure Laterality Date  ? COLONOSCOPY  11/13/2008  ? internal hemrrhoids  ? ?Social History  ? ?Social History Narrative  ? 1 caffeine drink daily   ? ?family history includes COPD in her mother; Heart disease in her father. ? ? ?Review of Systems ?See HPI ? ?Objective:  ? Physical Exam ?BP 123/70   Pulse 72   Ht '5\' 6"'$  (1.676 m)   Wt 173 lb (78.5 kg)   SpO2 98%   BMI 27.92 kg/m?  ?Lungs cta ?Cor  NL ?Abd soft NT ? ?

## 2021-07-08 ENCOUNTER — Ambulatory Visit (INDEPENDENT_AMBULATORY_CARE_PROVIDER_SITE_OTHER): Payer: Medicare Other | Admitting: Nurse Practitioner

## 2021-07-08 ENCOUNTER — Encounter: Payer: Self-pay | Admitting: Nurse Practitioner

## 2021-07-08 VITALS — BP 124/80 | HR 62 | Temp 97.5°F | Ht 66.0 in | Wt 174.0 lb

## 2021-07-08 DIAGNOSIS — I10 Essential (primary) hypertension: Secondary | ICD-10-CM | POA: Diagnosis not present

## 2021-07-08 DIAGNOSIS — R12 Heartburn: Secondary | ICD-10-CM

## 2021-07-08 DIAGNOSIS — E785 Hyperlipidemia, unspecified: Secondary | ICD-10-CM

## 2021-07-08 DIAGNOSIS — E663 Overweight: Secondary | ICD-10-CM | POA: Diagnosis not present

## 2021-07-08 DIAGNOSIS — E782 Mixed hyperlipidemia: Secondary | ICD-10-CM | POA: Diagnosis not present

## 2021-07-08 DIAGNOSIS — F325 Major depressive disorder, single episode, in full remission: Secondary | ICD-10-CM

## 2021-07-08 DIAGNOSIS — E2839 Other primary ovarian failure: Secondary | ICD-10-CM

## 2021-07-08 MED ORDER — ATORVASTATIN CALCIUM 40 MG PO TABS: 40.0000 mg | ORAL_TABLET | Freq: Every day | ORAL | 1 refills | Status: DC

## 2021-07-08 MED ORDER — LOSARTAN POTASSIUM-HCTZ 100-12.5 MG PO TABS: 1.0000 | ORAL_TABLET | Freq: Every day | ORAL | 1 refills | Status: DC

## 2021-07-08 MED ORDER — FLUOXETINE HCL 20 MG PO CAPS: 20.0000 mg | ORAL_CAPSULE | Freq: Every day | ORAL | 1 refills | Status: DC

## 2021-07-08 NOTE — Patient Instructions (Addendum)
Find out what pneumonia vaccine you had.  ? ?Please schedule AWV in 1 month  ? ?To get shingles vaccine at your local pharmacy ? ?To call [phone] to schedule bone density.  ? ?

## 2021-07-08 NOTE — Progress Notes (Signed)
? ? ?Careteam: ?Patient Care Team: ?Pcp, No as PCP - General ? ?PLACE OF SERVICE:  ?[facility]  ?Advanced Directive information ?Does Patient Have a Medical Advance Directive?: Yes, Type of Advance Directive: [place];Living will, Does patient want to make changes to medical advance directive?: No - Patient declined ? ?Allergies  ?Allergen Reactions  ? Amlodipine Other (See Comments)  ?  Pt does not recall  ? Desipramine Other (See Comments)  ?  Pt does not recall   ? Doxycycline Nausea And Vomiting  ? Lisinopril Cough  ? Sulfonamide Derivatives Other (See Comments)  ?  "i can't remember the reaction"  ? ? ?Chief Complaint  ?Patient presents with  ? Establish Care  ?  New patient establish care. Refill medications for 90 day supply at the mail order pharmacy. Pill bottles verified at initial appointment.   ? ? ? ?HPI: Patient is a 66 y.o. female to establish care.  ? ?Neuropathy- does not effect her expect at night, puts sock on at night which helps.  ?She had PRP and stem cells placed in her feet which only helped for about 6 months.  ? ?Hyperlipidemia- on lipitor 40 mg daily with fish oil.  ? ?She would like to lose 20 lbs.  ? ?On b supplement per recommendations. ? ?GERD- on Pepcid, followed by GI, increasing to 2 per GI.  ?She has a HH and plans to have endoscopy in May.  ? ?Depression- well controlled on fluoxetine. Started on medication after mother died.  ?She has been married for 61 years, husband has been sober 24 years. Wonderful marriage at this time.  ? ?Htn- on losartan-hctz ? ?Sees [facility] for dermatology, has eczema and using kenalog cream- supposed to use on for 2 weeks off for 2 weeks.  ? ?Review of Systems:  ?Review of Systems  ?Constitutional:  Negative for chills, fever and weight loss.  ?HENT:  Negative for tinnitus.   ?Respiratory:  Negative for cough, sputum production and shortness of breath.   ?Cardiovascular:  Negative for chest pain, palpitations and leg swelling.   ?Gastrointestinal:  Positive for heartburn. Negative for abdominal pain, constipation and diarrhea.  ?Genitourinary:  Negative for dysuria, frequency and urgency.  ?Musculoskeletal:  Negative for back pain, falls, joint pain and myalgias.  ?Skin: Negative.   ?Neurological:  Negative for dizziness and headaches.  ?Psychiatric/Behavioral:  Negative for depression and memory loss. The patient does not have insomnia.   ? ?Past Medical History:  ?Diagnosis Date  ? Anxiety   ? Depression   ? HLD (hyperlipidemia)   ? HTN (hypertension)   ? Hx: UTI (urinary tract infection)   ? Internal hemorrhoids 11/13/08  ? Migraine headache 05/07/2011  ? h/o atypical migraines  ? Rosacea   ? Vertigo   ? ?Past Surgical History:  ?Procedure Laterality Date  ? COLONOSCOPY  11/13/2008  ? internal hemrrhoids  ? ?Social History: ?  reports that she quit smoking about 45 years ago. Her smoking use included cigarettes. She has a 2.00 pack-year smoking history. She has never used smokeless tobacco. She reports current alcohol use of about 1.0 standard drink per week. She reports that she does not use drugs. ? ?Family History  ?Problem Relation Age of Onset  ? COPD Mother   ? Heart disease Father   ? Colon cancer Neg Hx   ? Colon polyps Neg Hx   ? Esophageal cancer Neg Hx   ? Stomach cancer Neg Hx   ?  Rectal cancer Neg Hx   ? ? ?Medications: ?Patient's Medications  ?New Prescriptions  ? No medications on file  ?Previous Medications  ? ASPIRIN 81 MG TABLET    Take 81 mg by mouth daily.    ? ATORVASTATIN (LIPITOR) 40 MG TABLET    Take 1 tablet (40 mg total) by mouth daily.  ? B COMPLEX VITAMINS TABLET    Take 1 tablet by mouth daily.  ? CHOLECALCIFEROL 50 MCG (2000 UT) CAPS    Take 1 capsule by mouth daily.  ? FAMOTIDINE (PEPCID) 10 MG TABLET    Take 10 mg by mouth at bedtime.  ? FLUOXETINE (PROZAC) 20 MG CAPSULE    Take 1 capsule (20 mg total) by mouth daily.  ? LOSARTAN-HYDROCHLOROTHIAZIDE (HYZAAR) 100-12.5 MG TABLET    Take 1 tablet by mouth daily.   ? OMEGA-3 FATTY ACIDS (FISH OIL PO)    Take 2 capsules by mouth daily.  ? TRIAMCINOLONE CREAM (KENALOG) 0.1 %    Apply 1 application. topically 2 (two) times daily.  ?Modified Medications  ? No medications on file  ?Discontinued Medications  ? CHOLECALCIFEROL (VITAMIN D) 400 UNITS TABS TABLET    Take 400 Units by mouth.  ? ? ?Physical Exam: ? ?Vitals:  ? 07/08/21 1307  ?BP: 124/80  ?Pulse: 62  ?Temp: (!) 97.5 ?F (36.4 ?C)  ?TempSrc: Temporal  ?SpO2: 95%  ?Weight: 174 lb (78.9 kg)  ?Height: $RemoveB'5\' 6"'wgLuScvf$  (1.676 m)  ? ?Body mass index is 28.08 kg/m?. ?Wt Readings from Last 3 Encounters:  ?07/08/21 174 lb (78.9 kg)  ?07/07/21 173 lb (78.5 kg)  ?03/18/20 165 lb (74.8 kg)  ? ? ?Physical Exam ?Constitutional:   ?   General: She is not in acute distress. ?   Appearance: She is well-developed. She is not diaphoretic.  ?HENT:  ?   Head: Normocephalic and atraumatic.  ?   Mouth/Throat:  ?   Pharynx: No oropharyngeal exudate.  ?Eyes:  ?   Conjunctiva/sclera: Conjunctivae normal.  ?   Pupils: Pupils are equal, round, and reactive to light.  ?Cardiovascular:  ?   Rate and Rhythm: Normal rate and regular rhythm.  ?   Heart sounds: Normal heart sounds.  ?Pulmonary:  ?   Effort: Pulmonary effort is normal.  ?   Breath sounds: Normal breath sounds.  ?Abdominal:  ?   General: Bowel sounds are normal.  ?   Palpations: Abdomen is soft.  ?Musculoskeletal:  ?   Cervical back: Normal range of motion and neck supple.  ?   Right lower leg: No edema.  ?   Left lower leg: No edema.  ?Skin: ?   General: Skin is warm and dry.  ?Neurological:  ?   Mental Status: She is alert and oriented to person, place, and time. Mental status is at baseline.  ?Psychiatric:     ?   Mood and Affect: Mood normal.  ? ? ?Labs reviewed: ?Basic Metabolic Panel: ?Recent Labs  ?  10/27/20 ?1542  ?NA 139  ?K 4.2  ?CL 100  ?CO2 23  ?GLUCOSE 97  ?BUN 16  ?CREATININE 0.84  ?CALCIUM 10.1  ? ?Liver Function Tests: ?Recent Labs  ?  10/27/20 ?1542  ?AST 18  ?ALT 18  ?ALKPHOS 95   ?BILITOT 0.3  ?PROT 6.8  ?ALBUMIN 4.9*  ? ?No results for input(s): LIPASE, AMYLASE in the last 8760 hours. ?No results for input(s): AMMONIA in the last 8760 hours. ?CBC: ?Recent Labs  ?  10/27/20 ?1542  ?  WBC 8.5  ?NEUTROABS 5.2  ?HGB 14.4  ?HCT 43.4  ?MCV 87  ?PLT 326  ? ?Lipid Panel: ?No results for input(s): CHOL, HDL, LDLCALC, TRIG, CHOLHDL, LDLDIRECT in the last 8760 hours. ?TSH: ?No results for input(s): TSH in the last 8760 hours. ?A1C: ?Lab Results  ?Component Value Date  ? HGBA1C 5.4 06/13/2013  ? ? ? ?Assessment/Plan ?1. Mixed hyperlipidemia ?-contnues on lipitor, dietary modifications encouraged ?- Lipid panel; Future ? ?2. Primary hypertension ?-Blood pressure well controlled ?Continue current medications ?Recheck metabolic panel ?- CBC with Differential/Platelet; Future ?- CMP with eGFR([facility]); Future ? ?3. Heartburn ?-followed by GI, continues on pepcid with dietary modifications. Planing to have EGD as well.  ? ?4. Overweight (BMI 25.0-29.9) ?Plans to work on weight loss through diet and exercise.  ? ?5. Depression, major, in remission ([place]) ?-in remission on prozac. Continue current regimen.  ? ?6. Estrogen deficiency ?- DG Bone Density; Future ? ? ?Return in about 6 months (around 01/07/2022) for routine follow up . ?Carlos American. Dewaine Oats, AGNP ? ?[facility] ?[phone]  ?

## 2021-07-11 ENCOUNTER — Ambulatory Visit
Admission: RE | Admit: 2021-07-11 | Discharge: 2021-07-11 | Disposition: A | Discharge status: Home / Self Care | Payer: Medicare Other | Source: Ambulatory Visit | Attending: Nurse Practitioner | Admitting: Nurse Practitioner

## 2021-07-11 ENCOUNTER — Other Ambulatory Visit: Payer: Self-pay | Admitting: Nurse Practitioner

## 2021-07-11 DIAGNOSIS — E2839 Other primary ovarian failure: Secondary | ICD-10-CM

## 2021-07-11 DIAGNOSIS — Z1231 Encounter for screening mammogram for malignant neoplasm of breast: Secondary | ICD-10-CM

## 2021-07-12 ENCOUNTER — Other Ambulatory Visit: Payer: Self-pay | Admitting: Nurse Practitioner

## 2021-07-12 ENCOUNTER — Other Ambulatory Visit: Payer: Medicare Other

## 2021-07-12 ENCOUNTER — Other Ambulatory Visit: Payer: Self-pay

## 2021-07-12 DIAGNOSIS — E2839 Other primary ovarian failure: Secondary | ICD-10-CM

## 2021-07-12 DIAGNOSIS — I10 Essential (primary) hypertension: Secondary | ICD-10-CM

## 2021-07-12 DIAGNOSIS — E782 Mixed hyperlipidemia: Secondary | ICD-10-CM

## 2021-07-12 MED ORDER — CALCIUM CARBONATE 600 MG PO TABS: 600.0000 mg | ORAL_TABLET | Freq: Two times a day (BID) | ORAL | 11 refills | Status: AC

## 2021-07-13 LAB — COMPLETE METABOLIC PANEL WITH GFR
AG Ratio: 2 (calc) (ref 1.0–2.5)
ALT: 27 U/L (ref 6–29)
AST: 23 U/L (ref 10–35)
Albumin: 4.4 g/dL (ref 3.6–5.1)
Alkaline phosphatase (APISO): 81 U/L (ref 37–153)
BUN: 17 mg/dL (ref 7–25)
CO2: 30 mmol/L (ref 20–32)
Calcium: 9.8 mg/dL (ref 8.6–10.4)
Chloride: 103 mmol/L (ref 98–110)
Creat: 0.9 mg/dL (ref 0.50–1.05)
Globulin: 2.2 g/dL (calc) (ref 1.9–3.7)
Glucose, Bld: 93 mg/dL (ref 65–99)
Potassium: 4.5 mmol/L (ref 3.5–5.3)
Sodium: 141 mmol/L (ref 135–146)
Total Bilirubin: 0.6 mg/dL (ref 0.2–1.2)
Total Protein: 6.6 g/dL (ref 6.1–8.1)
eGFR: 70 mL/min/{1.73_m2} (ref >=60)

## 2021-07-13 LAB — CBC WITH DIFFERENTIAL/PLATELET
Absolute Monocytes: 573 cells/uL (ref 200–950)
Basophils Absolute: 41 cells/uL (ref 0–200)
Basophils Relative: 0.6 %
Eosinophils Absolute: 117 cells/uL (ref 15–500)
Eosinophils Relative: 1.7 %
HCT: 45.4 % — ABNORMAL HIGH (ref 35.0–45.0)
Hemoglobin: 15 g/dL (ref 11.7–15.5)
Lymphs Abs: 1891 cells/uL (ref 850–3900)
MCH: 29.4 pg (ref 27.0–33.0)
MCHC: 33 g/dL (ref 32.0–36.0)
MCV: 89 fL (ref 80.0–100.0)
MPV: 11.4 fL (ref 7.5–12.5)
Monocytes Relative: 8.3 %
Neutro Abs: 4278 cells/uL (ref 1500–7800)
Neutrophils Relative %: 62 %
Platelets: 346 10*3/uL (ref 140–400)
RBC: 5.1 10*6/uL (ref 3.80–5.10)
RDW: 12.5 % (ref 11.0–15.0)
Total Lymphocyte: 27.4 %
WBC: 6.9 10*3/uL (ref 3.8–10.8)

## 2021-07-13 LAB — LIPID PANEL
Cholesterol: 221 mg/dL — ABNORMAL HIGH (ref <200)
HDL: 55 mg/dL (ref >=50)
LDL Cholesterol (Calc): 133 mg/dL (calc) — ABNORMAL HIGH
Non-HDL Cholesterol (Calc): 166 mg/dL (calc) — ABNORMAL HIGH (ref <130)
Total CHOL/HDL Ratio: 4 (calc) (ref <5.0)
Triglycerides: 194 mg/dL — ABNORMAL HIGH (ref <150)

## 2021-08-04 ENCOUNTER — Telehealth: Payer: Self-pay

## 2021-08-04 ENCOUNTER — Encounter: Payer: Self-pay | Admitting: Nurse Practitioner

## 2021-08-04 ENCOUNTER — Ambulatory Visit (INDEPENDENT_AMBULATORY_CARE_PROVIDER_SITE_OTHER): Payer: Medicare Other | Admitting: Nurse Practitioner

## 2021-08-04 DIAGNOSIS — Z0000 Encounter for general adult medical examination without abnormal findings: Secondary | ICD-10-CM

## 2021-08-04 NOTE — Telephone Encounter (Signed)
Ms. Michele Lowery, Michele Lowery are scheduled for a virtual visit with your provider today.   ? ?Just as we do with appointments in the office, we must obtain your consent to participate.  Your consent will be active for this visit and any virtual visit you may have with one of our providers in the next 365 days.   ? ?If you have a MyChart account, I can also send a copy of this consent to you electronically.  All virtual visits are billed to your insurance company just like a traditional visit in the office.  As this is a virtual visit, video technology does not allow for your provider to perform a traditional examination.  This may limit your provider's ability to fully assess your condition.  If your provider identifies any concerns that need to be evaluated in person or the need to arrange testing such as labs, EKG, etc, we will make arrangements to do so.   ? ?Although advances in technology are sophisticated, we cannot ensure that it will always work on either your end or our end.  If the connection with a video visit is poor, we may have to switch to a telephone visit.  With either a video or telephone visit, we are not always able to ensure that we have a secure connection.   I need to obtain your verbal consent now.   Are you willing to proceed with your visit today?  ? ?Michele Lowery has provided verbal consent on 08/04/2021 for a virtual visit (video or telephone). ? ? ?Carroll Kinds, CMA ?08/04/2021  2:53 PM ?  ?

## 2021-08-04 NOTE — Progress Notes (Signed)
? ?Subjective:  ? Michele Lowery is a 66 y.o. female who presents for Medicare Annual (Subsequent) preventive examination. ? ?Review of Systems    ? ?Cardiac Risk Factors include: advanced age (>67mn, >>27women);hypertension;dyslipidemia ? ?   ?Objective:  ?  ?Today's Vitals  ? 08/04/21 1506  ?PainSc: 5   ? ?There is no height or weight on file to calculate BMI. ? ? ?  08/04/2021  ?  2:49 PM 07/08/2021  ?  1:12 PM 03/18/2020  ?  2:56 PM  ?Advanced Directives  ?Does Patient Have a Medical Advance Directive? Yes Yes No;Yes  ?Type of [place] will  ?Does patient want to make changes to medical advance directive? No - Patient declined No - Patient declined   ?Copy of HLoveladyin Chart? No - copy requested No - copy requested   ?Would patient like information on creating a medical advance directive?   No - Patient declined  ? ? ?Current Medications (verified) ?Outpatient Encounter Medications as of 08/04/2021  ?Medication Sig  ? aspirin 81 MG tablet Take 81 mg by mouth daily.    ? atorvastatin (LIPITOR) 40 MG tablet Take 1 tablet (40 mg total) by mouth daily.  ? b complex vitamins tablet Take 1 tablet by mouth daily.  ? calcium carbonate (CALCIUM 600) 600 MG TABS tablet Take 1 tablet (600 mg total) by mouth 2 (two) times daily with a meal.  ? Cholecalciferol 50 MCG (2000 UT) CAPS Take 1 capsule by mouth daily.  ? famotidine (PEPCID) 10 MG tablet Take 10 mg by mouth at bedtime.  ? FLUoxetine (PROZAC) 20 MG capsule Take 1 capsule (20 mg total) by mouth daily.  ? losartan-hydrochlorothiazide (HYZAAR) 100-12.5 MG tablet Take 1 tablet by mouth daily.  ? Omega-3 Fatty Acids (FISH OIL PO) Take 2 capsules by mouth daily.  ? triamcinolone cream (KENALOG) 0.1 % Apply 1 application. topically 2 (two) times daily.  ? ?No facility-administered encounter medications on file as of  08/04/2021.  ? ? ?Allergies (verified) ?Amlodipine, Desipramine, Doxycycline, Lisinopril, and Sulfonamide derivatives  ? ?History: ?Past Medical History:  ?Diagnosis Date  ? Anxiety   ? Depression   ? HLD (hyperlipidemia)   ? HTN (hypertension)   ? Hx: UTI (urinary tract infection)   ? Internal hemorrhoids 11/13/08  ? Migraine headache 05/07/2011  ? h/o atypical migraines  ? Rosacea   ? Vertigo   ? ?Past Surgical History:  ?Procedure Laterality Date  ? COLONOSCOPY  11/13/2008  ? internal hemrrhoids  ? ?Family History  ?Problem Relation Age of Onset  ? COPD Mother   ? Heart disease Father   ? Colon cancer Neg Hx   ? Colon polyps Neg Hx   ? Esophageal cancer Neg Hx   ? Stomach cancer Neg Hx   ? Rectal cancer Neg Hx   ? ?Social History  ? ?Socioeconomic History  ? Marital status: Married  ?  Spouse name: Not on file  ? Number of children: 2  ? Years of education: Not on file  ? Highest education level: Not on file  ?Occupational History  ? 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 Smoking status: Former  ?  Packs/day: 0.50  ?  Years: 4.00  ?  Pack years: 2.00  ?  Types: Cigarettes  ?  Quit date: 07/09/1976  ?  Years since quitting: 45.1  ? Smokeless  tobacco: Never  ?Vaping Use  ? Vaping Use: Never used  ?Substance and Sexual Activity  ? Alcohol use: Yes  ?  Alcohol/week: 1.0 standard drink  ?  Types: 1 Standard drinks or equivalent per week  ?  Comment: 1-2 per month   ? Drug use: No  ? Sexual activity: Not on file  ?Other Topics Concern  ? Not on file  ?Social History Narrative  ? 1 caffeine drink daily   ? ?Social Determinants of Health  ? ?Financial Resource Strain: Not on file  ?Food Insecurity: Not on file  ?Transportation Needs: Not on file  ?Physical Activity: Not on file  ?Stress: Not on file  ?Social Connections: Not on file  ? ? ?Tobacco Counseling ?Counseling given: Not Answered ? ? ?Clinical Intake: ? ?Pre-visit preparation completed: Yes ? ?Pain : 0-10 ?Pain Score: 5  ?Pain Type: Acute pain ?Pain Location:  Throat ?Pain Descriptors / Indicators: Aching ? ?  ? ?BMI - recorded: 28 ?Diabetes: No ? ?How often do you need to have someone help you when you read instructions, pamphlets, or other written materials from your doctor or pharmacy?: 1 - Never ? ?Diabetic?no ? ?  ? ?  ? ? ?Activities of Daily Living ? ?  08/04/2021  ?  3:02 PM  ?In your present state of health, do you have any difficulty performing the following activities:  ?Hearing? 0  ?Vision? 0  ?Difficulty concentrating or making decisions? 0  ?Walking or climbing stairs? 0  ?Dressing or bathing? 0  ?Doing errands, shopping? 0  ?Preparing Food and eating ? N  ?Using the Toilet? N  ?In the past six months, have you accidently leaked urine? Y  ?Do you have problems with loss of bowel control? N  ?Managing your Medications? N  ?Managing your Finances? N  ?Housekeeping or managing your Housekeeping? N  ? ? ?Patient Care Team: ?Lauree Chandler, NP as PCP - General (Geriatric Medicine) ? ?Indicate any recent Medical Services you may have received from other than [facility] providers in the past year (date may be approximate). ? ?   ?Assessment:  ? This is a routine wellness examination for Michele Lowery. ? ?Hearing/Vision screen ?Hearing Screening - Comments:: Patient has no hearing problems. ?Vision Screening - Comments:: Patient wears glasses. Patient has not had yearly eye exam. Patient [facility] eye doctor at [facility]. ? ?Dietary issues and exercise activities discussed: ?Current Exercise Habits: Home exercise routine, Type of exercise: walking, Time (Minutes): 30, Frequency (Times/Week): 4, Weekly Exercise (Minutes/Week): 120 ? ? Goals Addressed   ?None ?  ? ?Depression Screen ? ?  08/04/2021  ?  2:46 PM 07/08/2021  ?  1:11 PM 05/21/2019  ?  8:16 AM 08/26/2018  ?  8:42 AM 08/23/2018  ?  3:51 PM 02/21/2018  ?  9:31 AM 07/16/2017  ?  1:38 PM  ?PHQ 2/9 Scores  ?PHQ - 2 Score 0 0 0 0 0 0 0  ?PHQ- 9 Score   2      ?  ?Fall Risk ? ?  08/04/2021  ?  2:48 PM 07/08/2021  ?  1:11 PM 05/21/2019  ?   8:16 AM 08/26/2018  ?  8:42 AM 08/23/2018  ?  3:51 PM  ?Fall Risk   ?Falls in the past year? 0 0 0 0 0  ?Number falls in past yr: 0 0 0 0 0  ?Injury with Fall? 0 0 0 0 0  ?Risk for fall due to : No Fall Risks  No Fall Risks     ?Follow up Falls evaluation completed Falls evaluation completed  Falls evaluation completed Falls evaluation completed  ? ? ?FALL RISK PREVENTION PERTAINING TO THE HOME: ? ?Any stairs in or around the home? Yes  ?If so, are there any without handrails? No  ?Home free of loose throw rugs in walkways, pet beds, electrical cords, etc? Yes  ?Adequate lighting in your home to reduce risk of falls? Yes  ? ?ASSISTIVE DEVICES UTILIZED TO PREVENT FALLS: ? ?Life alert? No  ?Use of a cane, walker or w/c? No  ?Grab bars in the bathroom? Yes  ?Shower chair or bench in shower? No  ?Elevated toilet seat or a handicapped toilet? Yes  ? ?TIMED UP AND GO: ? ?Was the test performed? No .  ? ? ?Cognitive Function: ?  ?  ? ?  08/04/2021  ?  2:49 PM  ?6CIT Screen  ?What Year? 0 points  ?What month? 0 points  ?What time? 0 points  ?Count back from 20 0 points  ?Months in reverse 0 points  ?Repeat phrase 2 points  ?Total Score 2 points  ? ? ?Immunizations ?Immunization History  ?Administered Date(s) Administered  ? Influenza Inj Mdck Quad Pf 02/10/2018  ? Influenza Split 03/14/2013  ? Influenza,inj,Quad PF,6+ Mos 12/21/2014, 01/27/2017  ? Influenza-Unspecified 02/14/2019  ? PFIZER(Purple Top)SARS-COV-2 Vaccination 05/16/2019, 06/11/2019, 01/29/2020  ? Td 02/17/2004  ? Tdap 12/11/2012  ? Zoster, Live 01/21/2014  ? ? ?TDAP status: Up to date ? ?Flu Vaccine status: Up to date ? ?Pneumococcal vaccine status: Due, Education has been provided regarding the importance of this vaccine. Advised may receive this vaccine at local pharmacy or Health Dept. Aware to provide a copy of the vaccination record if obtained from local pharmacy or Health Dept. Verbalized acceptance and understanding. ? ?Covid-19 vaccine status: Information  provided on how to obtain vaccines.  ? ?Qualifies for Shingles Vaccine? Yes   ?Zostavax completed No   ?Shingrix Completed?: No.    Education has been provided regarding the importance of this vaccine. Fraser Din

## 2021-08-04 NOTE — Progress Notes (Signed)
This service is provided via telemedicine ? ?No vital signs collected/recorded due to the encounter was a telemedicine visit.  ? ?Location of patient (ex: home, work):  Home  ? ?Patient consents to a telephone visit:  Yes, see encounter dated 08/04/2021 ? ?Location of the provider (ex: office, home):  [place] ? ?Name of any referring provider:  N/A ? ?Names of all persons participating in the telemedicine service and their role in the encounter:  Sherrie Mustache, Nurse Practitioner, Carroll Kinds, CMA, and patient.  ? ?Time spent on call:  9 minutes with medical assistant ? ?

## 2021-08-04 NOTE — Patient Instructions (Signed)
Ms. Nicholl , ?Thank you for taking time to come for your Medicare Wellness Visit. I appreciate your ongoing commitment to your health goals. Please review the following plan we discussed and let me know if I can assist you in the future.  ? ?Screening recommendations/referrals: ?Colonoscopy up to date ?Mammogram scheduled  ?Bone Density up to date ?Recommended yearly ophthalmology/optometry visit for glaucoma screening and checkup ?Recommended yearly dental visit for hygiene and checkup ? ?Vaccinations: ?Influenza vaccine up to date ?Pneumococcal vaccine to check with pharmacy, to get at pharmacy or office  ?Tdap vaccine up to date ?Shingles vaccine DUE- recommend to get at your local pharmacy      ? ?Advanced directives: please bring copy to office so we can place on file.  ? ?Conditions/risks identified: advance age, hypertension, hyperlipidemia ?Next appointment: yearly for awv ? ? ?Preventive Care 68 Years and Older, Female ?Preventive care refers to lifestyle choices and visits with your health care provider that can promote health and wellness. ?What does preventive care include? ?A yearly physical exam. This is also called an annual well check. ?Dental exams once or twice a year. ?Routine eye exams. Ask your health care provider how often you should have your eyes checked. ?Personal lifestyle choices, including: ?Daily care of your teeth and gums. ?Regular physical activity. ?Eating a healthy diet. ?Avoiding tobacco and drug use. ?Limiting alcohol use. ?Practicing safe sex. ?Taking low-dose aspirin every day. ?Taking vitamin and mineral supplements as recommended by your health care provider. ?What happens during an annual well check? ?The services and screenings done by your health care provider during your annual well check will depend on your age, overall health, lifestyle risk factors, and family history of disease. ?Counseling  ?Your health care provider may ask you questions about your: ?Alcohol  use. ?Tobacco use. ?Drug use. ?Emotional well-being. ?Home and relationship well-being. ?Sexual activity. ?Eating habits. ?History of falls. ?Memory and ability to understand (cognition). ?Work and work Statistician. ?Reproductive health. ?Screening  ?You may have the following tests or measurements: ?Height, weight, and BMI. ?Blood pressure. ?Lipid and cholesterol levels. These may be checked every 5 years, or more frequently if you are over 38 years old. ?Skin check. ?Lung cancer screening. You may have this screening every year starting at age 44 if you have a 30-pack-year history of smoking and currently smoke or have quit within the past 15 years. ?Fecal occult blood test (FOBT) of the stool. You may have this test every year starting at age 32. ?Flexible sigmoidoscopy or colonoscopy. You may have a sigmoidoscopy every 5 years or a colonoscopy every 10 years starting at age 20. ?Hepatitis C blood test. ?Hepatitis B blood test. ?Sexually transmitted disease (STD) testing. ?Diabetes screening. This is done by checking your blood sugar (glucose) after you have not eaten for a while (fasting). You may have this done every 1-3 years. ?Bone density scan. This is done to screen for osteoporosis. You may have this done starting at age 33. ?Mammogram. This may be done every 1-2 years. Talk to your health care provider about how often you should have regular mammograms. ?Talk with your health care provider about your test results, treatment options, and if necessary, the need for more tests. ?Vaccines  ?Your health care provider may recommend certain vaccines, such as: ?Influenza vaccine. This is recommended every year. ?Tetanus, diphtheria, and acellular pertussis (Tdap, Td) vaccine. You may need a Td booster every 10 years. ?Zoster vaccine. You may need this after age 22. ?Pneumococcal 13-valent  conjugate (PCV13) vaccine. One dose is recommended after age 20. ?Pneumococcal polysaccharide (PPSV23) vaccine. One dose is  recommended after age 42. ?Talk to your health care provider about which screenings and vaccines you need and how often you need them. ?This information is not intended to replace advice given to you by your health care provider. Make sure you discuss any questions you have with your health care provider. ?Document Released: 04/23/2015 Document Revised: 12/15/2015 Document Reviewed: 01/26/2015 ?Elsevier Interactive Patient Education ? 2017 [place]. ? ?Fall Prevention in the Home ?Falls can cause injuries. They can happen to people of all ages. There are many things you can do to make your home safe and to help prevent falls. ?What can I do on the outside of my home? ?Regularly fix the edges of walkways and driveways and fix any cracks. ?Remove anything that might make you trip as you walk through a door, such as a raised step or threshold. ?Trim any bushes or trees on the path to your home. ?Use bright outdoor lighting. ?Clear any walking paths of anything that might make someone trip, such as rocks or tools. ?Regularly check to see if handrails are loose or broken. Make sure that both sides of any steps have handrails. ?Any raised decks and porches should have guardrails on the edges. ?Have any leaves, snow, or ice cleared regularly. ?Use sand or salt on walking paths during winter. ?Clean up any spills in your garage right away. This includes oil or grease spills. ?What can I do in the bathroom? ?Use night lights. ?Install grab bars by the toilet and in the tub and shower. Do not use towel bars as grab bars. ?Use non-skid mats or decals in the tub or shower. ?If you need to sit down in the shower, use a plastic, non-slip stool. ?Keep the floor dry. Clean up any water that spills on the floor as soon as it happens. ?Remove soap buildup in the tub or shower regularly. ?Attach bath mats securely with double-sided non-slip rug tape. ?Do not have throw rugs and other things on the floor that can make you  trip. ?What can I do in the bedroom? ?Use night lights. ?Make sure that you have a light by your bed that is easy to reach. ?Do not use any sheets or blankets that are too big for your bed. They should not hang down onto the floor. ?Have a firm chair that has side arms. You can use this for support while you get dressed. ?Do not have throw rugs and other things on the floor that can make you trip. ?What can I do in the kitchen? ?Clean up any spills right away. ?Avoid walking on wet floors. ?Keep items that you use a lot in easy-to-reach places. ?If you need to reach something above you, use a strong step stool that has a grab bar. ?Keep electrical cords out of the way. ?Do not use floor polish or wax that makes floors slippery. If you must use wax, use non-skid floor wax. ?Do not have throw rugs and other things on the floor that can make you trip. ?What can I do with my stairs? ?Do not leave any items on the stairs. ?Make sure that there are handrails on both sides of the stairs and use them. Fix handrails that are broken or loose. Make sure that handrails are as long as the stairways. ?Check any carpeting to make sure that it is firmly attached to the stairs. Fix any carpet that  is loose or worn. ?Avoid having throw rugs at the top or bottom of the stairs. If you do have throw rugs, attach them to the floor with carpet tape. ?Make sure that you have a light switch at the top of the stairs and the bottom of the stairs. If you do not have them, ask someone to add them for you. ?What else can I do to help prevent falls? ?Wear shoes that: ?Do not have high heels. ?Have rubber bottoms. ?Are comfortable and fit you well. ?Are closed at the toe. Do not wear sandals. ?If you use a stepladder: ?Make sure that it is fully opened. Do not climb a closed stepladder. ?Make sure that both sides of the stepladder are locked into place. ?Ask someone to hold it for you, if possible. ?Clearly mark and make sure that you can  see: ?Any grab bars or handrails. ?First and last steps. ?Where the edge of each step is. ?Use tools that help you move around (mobility aids) if they are needed. These include: ?Canes. ?Walkers. ?Scooters. ?Crutches. ?Turn on

## 2021-08-14 ENCOUNTER — Encounter: Payer: Self-pay | Admitting: Internal Medicine

## 2021-08-18 ENCOUNTER — Encounter: Payer: Medicare Other | Admitting: Internal Medicine

## 2021-08-22 ENCOUNTER — Ambulatory Visit: Payer: Medicare Other

## 2021-08-22 ENCOUNTER — Ambulatory Visit
Admission: RE | Admit: 2021-08-22 | Discharge: 2021-08-22 | Disposition: A | Discharge status: Home / Self Care | Payer: Medicare Other | Source: Ambulatory Visit | Attending: Nurse Practitioner | Admitting: Nurse Practitioner

## 2021-08-22 DIAGNOSIS — Z1231 Encounter for screening mammogram for malignant neoplasm of breast: Secondary | ICD-10-CM

## 2021-08-26 ENCOUNTER — Encounter: Payer: Self-pay | Admitting: Family

## 2021-08-26 ENCOUNTER — Ambulatory Visit (INDEPENDENT_AMBULATORY_CARE_PROVIDER_SITE_OTHER): Payer: Medicare Other | Admitting: Family

## 2021-08-26 VITALS — BP 130/90 | HR 59 | Temp 98.4°F | Ht 66.0 in | Wt 174.0 lb

## 2021-08-26 DIAGNOSIS — R051 Acute cough: Secondary | ICD-10-CM | POA: Diagnosis not present

## 2021-08-26 DIAGNOSIS — R0982 Postnasal drip: Secondary | ICD-10-CM | POA: Diagnosis not present

## 2021-08-26 MED ORDER — BENZONATATE 100 MG PO CAPS: 100.0000 mg | ORAL_CAPSULE | Freq: Three times a day (TID) | ORAL | 0 refills | Status: DC | PRN

## 2021-08-26 MED ORDER — LORATADINE 10 MG PO TABS: 10.0000 mg | ORAL_TABLET | Freq: Every day | ORAL | 0 refills | Status: DC

## 2021-08-26 NOTE — Progress Notes (Signed)
Provider: Kimyah Frein FNP-C  Lauree Chandler, NP  Patient Care Team: Lauree Chandler, NP as PCP - General (Geriatric Medicine)  Extended Emergency Contact Information Primary Emergency Contact: Lyons,John Address: [address]          [place], [place] [address] Johnnette Litter of [place] Mobile Phone: [phone] Relation: Spouse  Code Status: Full code Goals of care: Advanced Directive information    08/04/2021    2:49 PM  Advanced Directives  Does Patient Have a Medical Advance Directive? Yes  Type of Paramedic of [place];Living will  Does patient want to make changes to medical advance directive? No - Patient declined  Copy of [place] in Chart? No - copy requested     Chief Complaint  Patient presents with   Acute Visit    Patient complains of coughing for 3 weeks. Cough is dry cough. Patient states that 3 weeks ago she had slight fever, cough and sore throat. Patient states she has not had sore throat since. She has been clearing throat and chest started hurting when she breathes.    HPI:  Pt is a 66 y.o. female seen today for an acute visit for evaluation of cough x3 weeks. Cough is described as dry.  Symptoms started with slight fever 100.0 sore throat 3 weeks ago but has had no sore throat since then. Has been frequently clearing throat. Also states hurts to breath when coughing.No exposure to sick person with COVID-19.She took COVID-19 test and was negative. She denies any fever,chills,fatigue,body aches,runny nose,chest tightness,chest pain,palpitation or shortness of breath.  Past Medical History:  Diagnosis Date   Anxiety    Depression    HLD (hyperlipidemia)    HTN (hypertension)    Hx: UTI (urinary tract infection)    Internal hemorrhoids 11/13/08   Migraine headache 05/07/2011   h/o atypical migraines   Rosacea    Vertigo    Past Surgical History:  Procedure Laterality Date   COLONOSCOPY  11/13/2008    internal hemrrhoids    Allergies  Allergen Reactions   Amlodipine Other (See Comments)    Pt does not recall   Desipramine Other (See Comments)    Pt does not recall    Doxycycline Nausea And Vomiting   Lisinopril Cough   Sulfonamide Derivatives Other (See Comments)    "i can't remember the reaction"    Outpatient Encounter Medications as of 08/26/2021  Medication Sig   aspirin 81 MG tablet Take 81 mg by mouth daily.     atorvastatin (LIPITOR) 40 MG tablet Take 1 tablet (40 mg total) by mouth daily.   b complex vitamins tablet Take 1 tablet by mouth daily.   calcium carbonate (CALCIUM 600) 600 MG TABS tablet Take 1 tablet (600 mg total) by mouth 2 (two) times daily with a meal.   Cholecalciferol 50 MCG (2000 UT) CAPS Take 1 capsule by mouth daily.   famotidine (PEPCID) 10 MG tablet Take 10 mg by mouth at bedtime.   FLUoxetine (PROZAC) 20 MG capsule Take 1 capsule (20 mg total) by mouth daily.   losartan-hydrochlorothiazide (HYZAAR) 100-12.5 MG tablet Take 1 tablet by mouth daily.   Omega-3 Fatty Acids (FISH OIL PO) Take 2 capsules by mouth daily.   triamcinolone cream (KENALOG) 0.1 % Apply 1 application. topically 2 (two) times daily.   No facility-administered encounter medications on file as of 08/26/2021.    Review of Systems  Constitutional:  Negative for appetite change, chills, fatigue  and fever.  HENT:  Positive for sore throat. Negative for congestion, hearing loss, rhinorrhea, sinus pressure, sinus pain and sneezing.        Frequent clearing of throat   Respiratory:  Positive for cough. Negative for chest tightness, shortness of breath and wheezing.   Cardiovascular:  Negative for chest pain, palpitations and leg swelling.  Gastrointestinal:  Negative for abdominal distention, abdominal pain, diarrhea, nausea and vomiting.  Skin:  Negative for color change, pallor and rash.  Neurological:  Negative for dizziness and light-headedness.       H/A    Immunization  History  Administered Date(s) Administered   Influenza Inj Mdck Quad Pf 02/10/2018   Influenza Split 03/14/2013   Influenza,inj,Quad PF,6+ Mos 12/21/2014, 01/27/2017   Influenza-Unspecified 02/14/2019   PFIZER(Purple Top)SARS-COV-2 Vaccination 05/16/2019, 06/11/2019, 01/29/2020   Td 02/17/2004   Tdap 12/11/2012   Zoster, Live 01/21/2014   Pertinent  Health Maintenance Due  Topic Date Due   INFLUENZA VACCINE  11/08/2021   MAMMOGRAM  08/23/2023   COLONOSCOPY (Pts 45-52yr Insurance coverage will need to be confirmed)  03/23/2029   DEXA SCAN  Completed      10/27/2020    3:22 PM 05/26/2021   12:45 PM 07/08/2021    1:11 PM 08/04/2021    2:48 PM 08/26/2021    9:20 AM  Fall Risk  Falls in the past year?   0 0 0  Was there an injury with Fall?   0 0 0  Fall Risk Category Calculator   0 0 0  Fall Risk Category   Low Low Low  Patient Fall Risk Level Low fall risk Low fall risk Low fall risk Low fall risk Low fall risk  Patient at Risk for Falls Due to   No Fall Risks No Fall Risks No Fall Risks  Fall risk Follow up   Falls evaluation completed Falls evaluation completed Falls evaluation completed   Functional Status Survey:    Vitals:   08/26/21 0920  BP: 130/90  Pulse: (!) 59  Temp: 98.4 F (36.9 C)  SpO2: 95%  Weight: 174 lb (78.9 kg)  Height: '5\' 6"'$  (1.676 m)   Body mass index is 28.08 kg/m. Physical Exam Vitals reviewed.  Constitutional:      General: She is not in acute distress.    Appearance: Normal appearance. She is overweight. She is not ill-appearing or diaphoretic.  HENT:     Head: Normocephalic.     Right Ear: Tympanic membrane, ear canal and external ear normal. There is no impacted cerumen.     Left Ear: Tympanic membrane, ear canal and external ear normal. There is no impacted cerumen.     Nose: Nose normal. No congestion or rhinorrhea.     Mouth/Throat:     Mouth: Mucous membranes are moist.     Pharynx: Oropharynx is clear. No oropharyngeal exudate or  posterior oropharyngeal erythema.     Comments: PND Eyes:     General: No scleral icterus.       Right eye: No discharge.        Left eye: No discharge.     Extraocular Movements: Extraocular movements intact.     Conjunctiva/sclera: Conjunctivae normal.     Pupils: Pupils are equal, round, and reactive to light.  Neck:     Vascular: No carotid bruit.  Cardiovascular:     Rate and Rhythm: Normal rate and regular rhythm.     Pulses: Normal pulses.     Heart sounds:  Normal heart sounds. No murmur heard.   No friction rub. No gallop.  Pulmonary:     Effort: Pulmonary effort is normal. No respiratory distress.     Breath sounds: Normal breath sounds. No wheezing, rhonchi or rales.  Chest:     Chest wall: No tenderness.  Abdominal:     General: Bowel sounds are normal. There is no distension.     Palpations: Abdomen is soft. There is no mass.     Tenderness: There is no abdominal tenderness. There is no right CVA tenderness, left CVA tenderness, guarding or rebound.  Musculoskeletal:        General: No swelling or tenderness. Normal range of motion.     Cervical back: Normal range of motion. No rigidity or tenderness.     Right lower leg: No edema.     Left lower leg: No edema.  Lymphadenopathy:     Cervical: No cervical adenopathy.  Skin:    General: Skin is warm and dry.     Coloration: Skin is not pale.     Findings: No erythema or rash.  Neurological:     Mental Status: She is alert and oriented to person, place, and time.     Motor: No weakness.     Gait: Gait normal.  Psychiatric:        Mood and Affect: Mood normal.        Speech: Speech normal.        Behavior: Behavior normal.    Labs reviewed: Recent Labs    10/27/20 1542 07/12/21 0834  NA 139 141  K 4.2 4.5  CL 100 103  CO2 23 30  GLUCOSE 97 93  BUN 16 17  CREATININE 0.84 0.90  CALCIUM 10.1 9.8   Recent Labs    10/27/20 1542 07/12/21 0834  AST 18 23  ALT 18 27  ALKPHOS 95  --   BILITOT 0.3 0.6   PROT 6.8 6.6  ALBUMIN 4.9*  --    Recent Labs    10/27/20 1542 07/12/21 0834  WBC 8.5 6.9  NEUTROABS 5.2 4,278  HGB 14.4 15.0  HCT 43.4 45.4*  MCV 87 89.0  PLT 326 346   Lab Results  Component Value Date   TSH 2.180 05/21/2019   Lab Results  Component Value Date   HGBA1C 5.4 06/13/2013   Lab Results  Component Value Date   CHOL 221 (H) 07/12/2021   HDL 55 07/12/2021   LDLCALC 133 (H) 07/12/2021   TRIG 194 (H) 07/12/2021   CHOLHDL 4.0 07/12/2021    Significant Diagnostic Results in last 30 days:  MM 3D SCREEN BREAST BILATERAL  Result Date: 08/23/2021 CLINICAL DATA:  Screening. EXAM: DIGITAL SCREENING BILATERAL MAMMOGRAM WITH TOMOSYNTHESIS AND CAD TECHNIQUE: Bilateral screening digital craniocaudal and mediolateral oblique mammograms were obtained. Bilateral screening digital breast tomosynthesis was performed. The images were evaluated with computer-aided detection. COMPARISON:  Previous exam(s). ACR Breast Density Category a: The breast tissue is almost entirely fatty. FINDINGS: There are no findings suspicious for malignancy. IMPRESSION: No mammographic evidence of malignancy. A result letter of this screening mammogram will be mailed directly to the patient. RECOMMENDATION: Screening mammogram in one year. (Code:[id number]-B-01Y) BI-RADS CATEGORY  1: Negative. Electronically Signed   By: Ammie Ferrier M.D.   On: 08/23/2021 11:07    Assessment/Plan  1.Acute cough Afebrile Bilateral lungs clear to auscultation - start on benzonatate as below  - benzonatate (TESSALON PERLES) 100 MG capsule; Take 1 capsule (100 mg total) by  mouth 3 (three) times daily as needed for cough.  Dispense: 20 capsule; Refill: 0 -Notify provider if symptoms worsen or running any fever,chills, shortness of breath or wheezing -Start on antibiotics if symptoms worsen  2. PND (post-nasal drip) Advised to take loratadine 10 mg tablet 1 by mouth daily for 14 days Over-the-counter saline nasal spray as  needed  Family/ staff Communication: Reviewed plan of care with patient verbalized understanding   Labs/tests ordered: None   Next Appointment: As needed if symptoms worsen or fail to improve    Sandrea Hughs, NP

## 2021-08-26 NOTE — Patient Instructions (Signed)
-   Notify provide if symptoms worsen or fail to improve

## 2021-09-02 ENCOUNTER — Other Ambulatory Visit: Payer: Self-pay | Admitting: Nurse Practitioner

## 2021-09-02 DIAGNOSIS — E785 Hyperlipidemia, unspecified: Secondary | ICD-10-CM

## 2021-09-02 DIAGNOSIS — I10 Essential (primary) hypertension: Secondary | ICD-10-CM

## 2021-11-14 ENCOUNTER — Other Ambulatory Visit: Payer: Self-pay | Admitting: Nurse Practitioner

## 2021-11-14 DIAGNOSIS — I10 Essential (primary) hypertension: Secondary | ICD-10-CM

## 2021-11-15 ENCOUNTER — Other Ambulatory Visit: Payer: Self-pay | Admitting: Nurse Practitioner

## 2021-11-15 DIAGNOSIS — E785 Hyperlipidemia, unspecified: Secondary | ICD-10-CM

## 2021-11-23 ENCOUNTER — Other Ambulatory Visit: Payer: Self-pay | Admitting: Nurse Practitioner

## 2021-11-23 DIAGNOSIS — F325 Major depressive disorder, single episode, in full remission: Secondary | ICD-10-CM

## 2021-12-07 IMAGING — CT CT ABD-PELV W/ CM
2 of 5 series · 15 of 46 positions shown, 17 images · IV contrast (APPLIED)
Comparison: 11/02/2010

CLINICAL DATA: Passing stool from vagina, evaluate for colovaginal
fistula

EXAM:
CT ABDOMEN AND PELVIS WITH CONTRAST
TECHNIQUE: Multidetector CT imaging of the abdomen and pelvis was performed
using the standard protocol following bolus administration of
intravenous contrast.
CONTRAST:  100mL OMNIPAQUE IOHEXOL 300 MG/ML SOLN, 30mL OMNIPAQUE
IOHEXOL 300 MG/ML SOLN

[Series 2: axial st · axial · 0.70mm/px · z∈[-446,-40]mm · 12 of 97 slices shown, 14 images]
[im 8/97  soft-tissue]
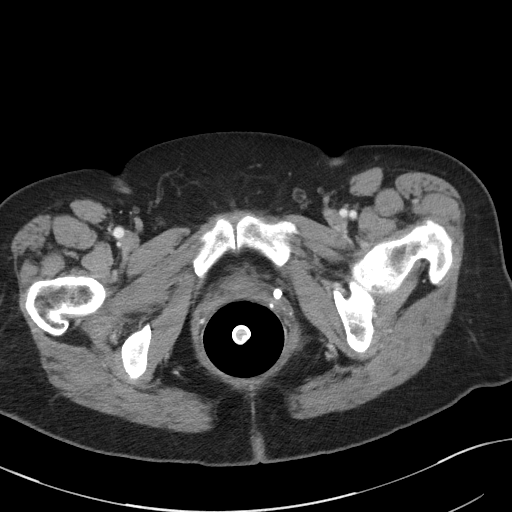
[im 8/97  bone]
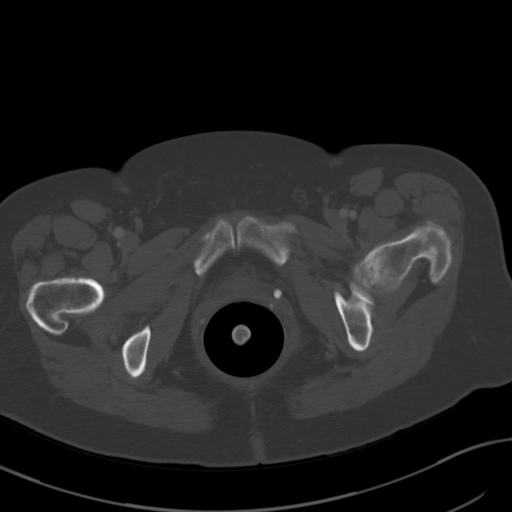
[im 15/97  soft-tissue]
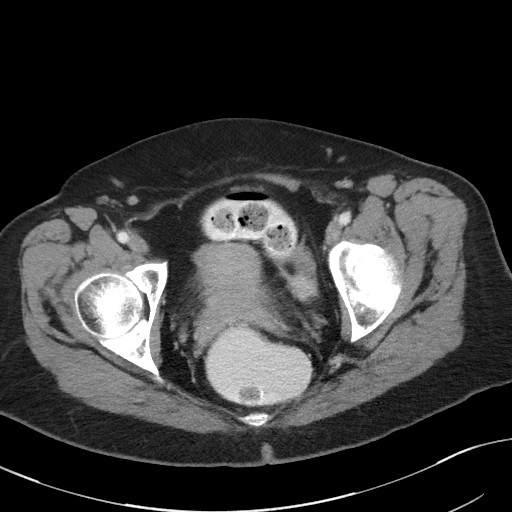
[im 23/97  soft-tissue]
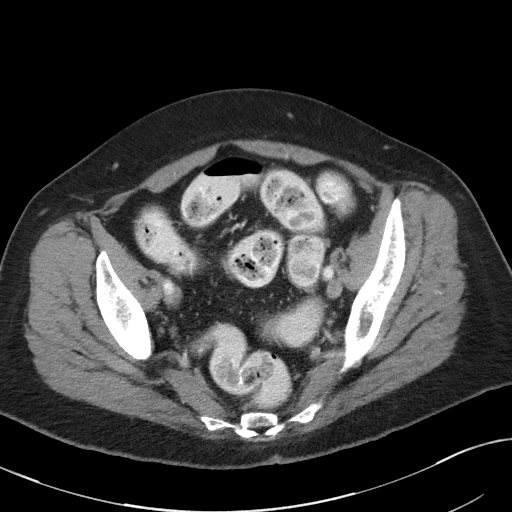
[im 30/97  soft-tissue]
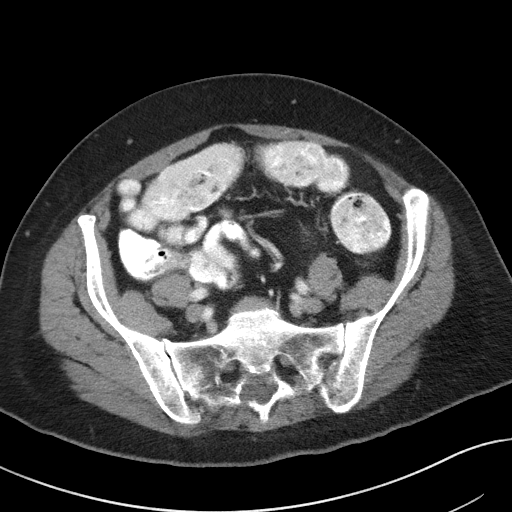
[im 37/97  soft-tissue]
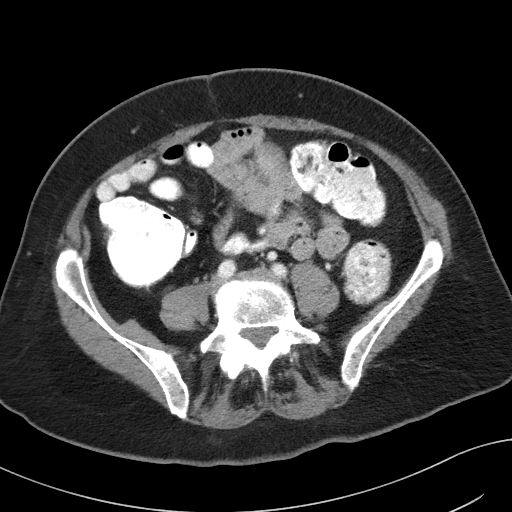
[im 45/97  soft-tissue]
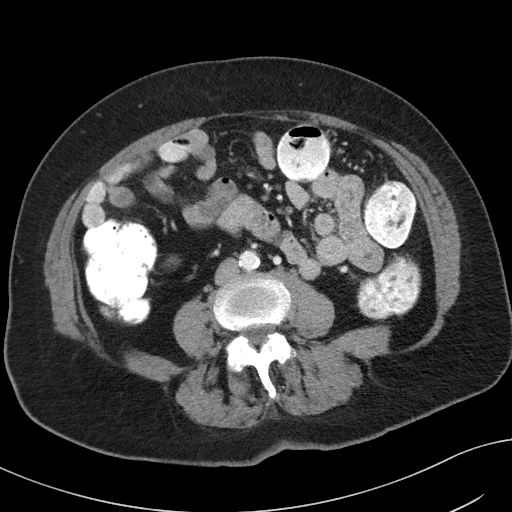
[im 52/97  soft-tissue]
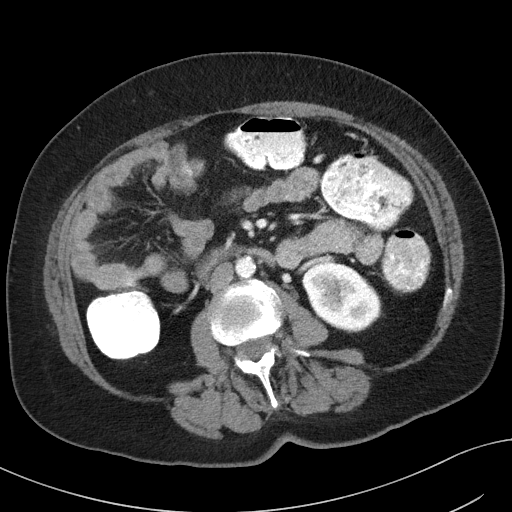
[im 60/97  soft-tissue]
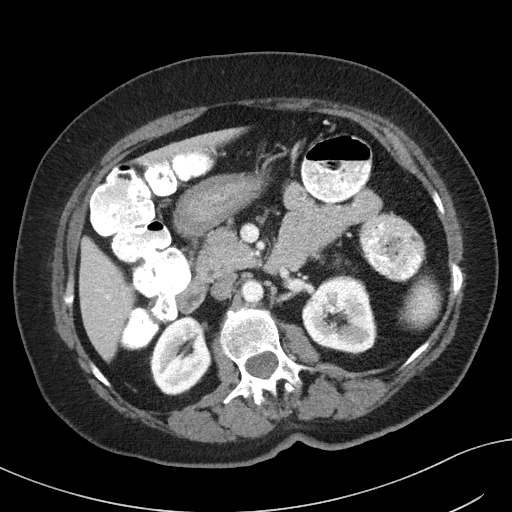
[im 67/97  soft-tissue]
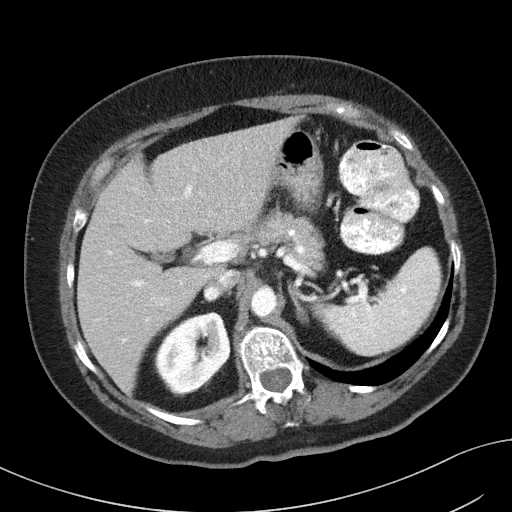
[im 67/97  bone]
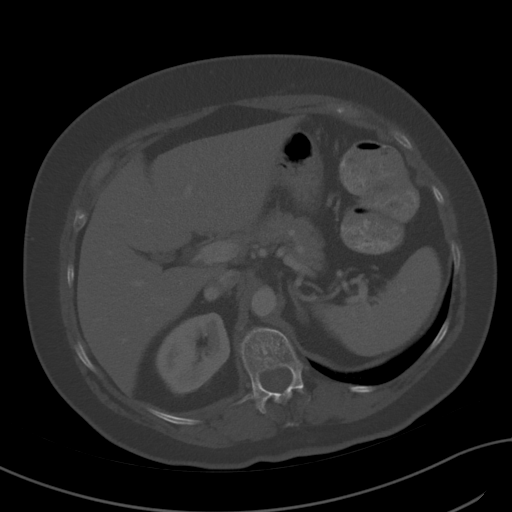
[im 74/97  soft-tissue]
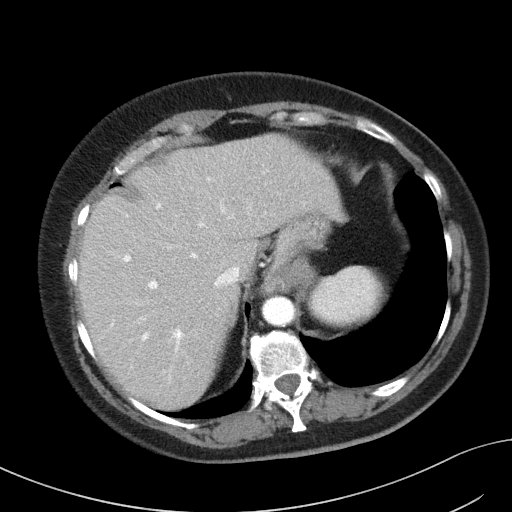
[im 82/97  soft-tissue]
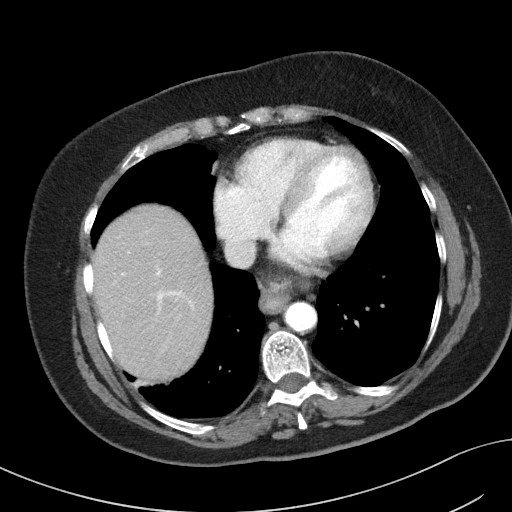
[im 89/97  soft-tissue]
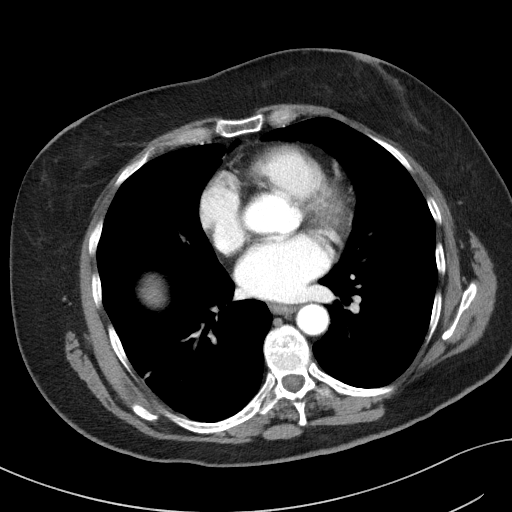

[Series 4: coronal st · coronal · 0.71mm/px · 3 of 100 slices shown]
[im 34/100  soft-tissue]
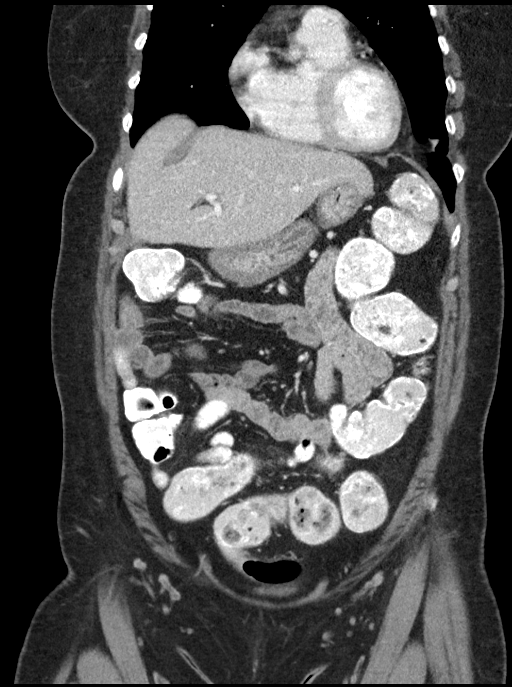
[im 45/100  soft-tissue]
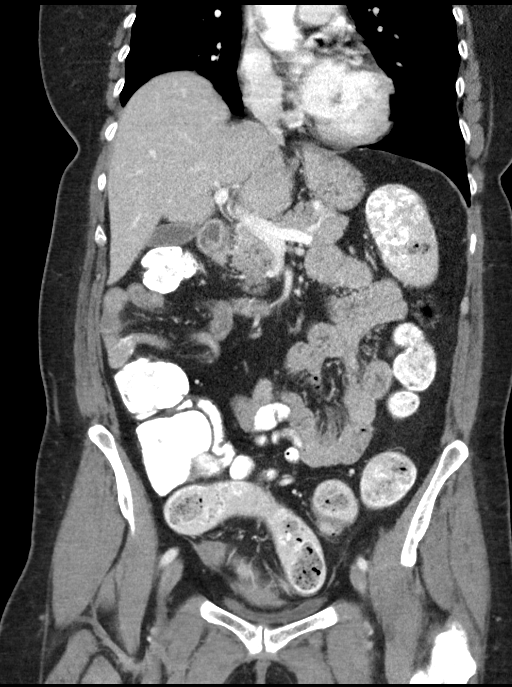
[im 56/100  soft-tissue]
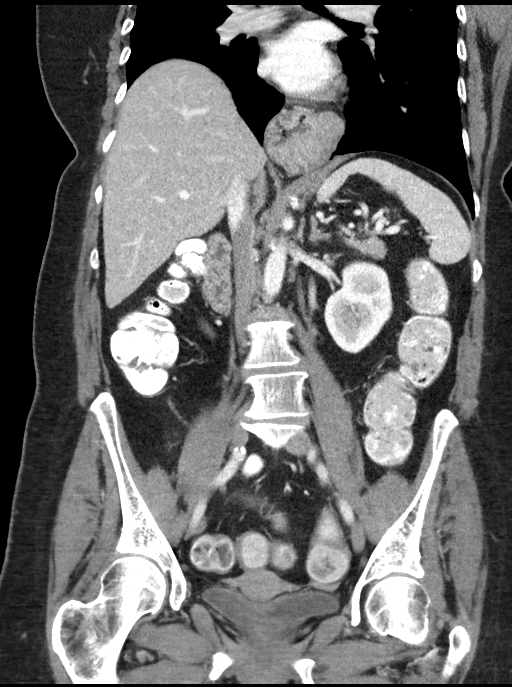

[15 of 46 positions shown; findings below may reference images not displayed]

FINDINGS: Lower chest: Mild linear scarring/atelectasis in the right lower
lobe.

Hepatobiliary: Liver is within normal limits.

Gallbladder is unremarkable. No intrahepatic or extrahepatic ductal
dilatation.

Pancreas: Within normal limits.

Spleen: Within normal limits.

Adrenals/Urinary Tract: Adrenal glands are within normal limits.

Kidneys are within normal limits.  No hydronephrosis.

Bladder is within normal limits.

Stomach/Bowel: Stomach is notable for a moderate hiatal hernia.

No evidence of bowel obstruction.

Normal appendix (series 2/image 65).

Rectal contrast within the left colon. No diverticulosis is evident.
No colovaginal fistula is visualized.

Vascular/Lymphatic: No evidence of abdominal aortic aneurysm.

Atherosclerotic calcifications of the abdominal aorta and branch
vessels.

No suspicious abdominopelvic lymphadenopathy.

Reproductive: Uterus is within normal limits. No air within the
endometrial cavity or vagina.

Bilateral ovaries are within normal limits.

Other: No abdominopelvic ascites.

Musculoskeletal: Mild degenerative changes at L2-3.
IMPRESSION: No evidence of colovaginal fistula on CT.

## 2022-01-09 ENCOUNTER — Ambulatory Visit: Payer: Medicare Other | Admitting: Nurse Practitioner

## 2022-01-16 ENCOUNTER — Encounter: Payer: Self-pay | Admitting: Nurse Practitioner

## 2022-01-16 ENCOUNTER — Ambulatory Visit (INDEPENDENT_AMBULATORY_CARE_PROVIDER_SITE_OTHER): Payer: Medicare Other | Admitting: Nurse Practitioner

## 2022-01-16 VITALS — BP 134/88 | HR 68 | Temp 97.3°F | Ht 66.0 in | Wt 173.0 lb

## 2022-01-16 DIAGNOSIS — R12 Heartburn: Secondary | ICD-10-CM

## 2022-01-16 DIAGNOSIS — F325 Major depressive disorder, single episode, in full remission: Secondary | ICD-10-CM

## 2022-01-16 DIAGNOSIS — I10 Essential (primary) hypertension: Secondary | ICD-10-CM | POA: Diagnosis not present

## 2022-01-16 DIAGNOSIS — Z23 Encounter for immunization: Secondary | ICD-10-CM

## 2022-01-16 DIAGNOSIS — E663 Overweight: Secondary | ICD-10-CM

## 2022-01-16 DIAGNOSIS — E782 Mixed hyperlipidemia: Secondary | ICD-10-CM

## 2022-01-16 LAB — COMPLETE METABOLIC PANEL WITH GFR
AG Ratio: 2.2 (calc) (ref 1.0–2.5)
ALT: 23 U/L (ref 6–29)
AST: 21 U/L (ref 10–35)
Albumin: 4.6 g/dL (ref 3.6–5.1)
Alkaline phosphatase (APISO): 88 U/L (ref 37–153)
BUN: 16 mg/dL (ref 7–25)
CO2: 32 mmol/L (ref 20–32)
Calcium: 9.9 mg/dL (ref 8.6–10.4)
Chloride: 102 mmol/L (ref 98–110)
Creat: 0.83 mg/dL (ref 0.50–1.05)
Globulin: 2.1 g/dL (calc) (ref 1.9–3.7)
Glucose, Bld: 95 mg/dL (ref 65–99)
Potassium: 4.5 mmol/L (ref 3.5–5.3)
Sodium: 141 mmol/L (ref 135–146)
Total Bilirubin: 0.6 mg/dL (ref 0.2–1.2)
Total Protein: 6.7 g/dL (ref 6.1–8.1)
eGFR: 77 mL/min/{1.73_m2} (ref >=60)

## 2022-01-16 LAB — CBC WITH DIFFERENTIAL/PLATELET
Absolute Monocytes: 600 cells/uL (ref 200–950)
Basophils Absolute: 40 cells/uL (ref 0–200)
Basophils Relative: 0.5 %
Eosinophils Absolute: 103 cells/uL (ref 15–500)
Eosinophils Relative: 1.3 %
HCT: 43.1 % (ref 35.0–45.0)
Hemoglobin: 14.6 g/dL (ref 11.7–15.5)
Lymphs Abs: 1785 cells/uL (ref 850–3900)
MCH: 29.9 pg (ref 27.0–33.0)
MCHC: 33.9 g/dL (ref 32.0–36.0)
MCV: 88.3 fL (ref 80.0–100.0)
MPV: 11.7 fL (ref 7.5–12.5)
Monocytes Relative: 7.6 %
Neutro Abs: 5372 cells/uL (ref 1500–7800)
Neutrophils Relative %: 68 %
Platelets: 344 10*3/uL (ref 140–400)
RBC: 4.88 10*6/uL (ref 3.80–5.10)
RDW: 12.2 % (ref 11.0–15.0)
Total Lymphocyte: 22.6 %
WBC: 7.9 10*3/uL (ref 3.8–10.8)

## 2022-01-16 LAB — LIPID PANEL
Cholesterol: 179 mg/dL (ref <200)
HDL: 56 mg/dL (ref >=50)
LDL Cholesterol (Calc): 95 mg/dL (calc)
Non-HDL Cholesterol (Calc): 123 mg/dL (calc) (ref <130)
Total CHOL/HDL Ratio: 3.2 (calc) (ref <5.0)
Triglycerides: 189 mg/dL — ABNORMAL HIGH (ref <150)

## 2022-01-16 NOTE — Patient Instructions (Signed)
Shingles vaccines at local pharmacy

## 2022-01-16 NOTE — Progress Notes (Signed)
  Careteam: Patient Care Team: Eubanks, Jessica K, NP as PCP - General (Geriatric Medicine)  PLACE OF SERVICE:  [facility]  Advanced Directive information    Allergies  Allergen Reactions   Amlodipine Other (See Comments)    Pt does not recall   Desipramine Other (See Comments)    Pt does not recall    Doxycycline Nausea And Vomiting   Lisinopril Cough   Sulfonamide Derivatives Other (See Comments)    "i can't remember the reaction"    Chief Complaint  Patient presents with   Medical Management of Chronic Issues    Patient presents today for a 6 month follow-up   Quality Metric Gaps    Zoster, pneumonia, COVID #4     HPI: Patient is a 66 y.o. female Presents today for routine follow up with no acute complaints.   HLD: does water aerobics 3x a week, also goes to the gym twice a week. States she is trying to eat better and avoids foods high in sugar and fat. Remains on Lipitor.  Neuropathy: moisturizes feet every night and puts socks on and that helps. It is not debilitating    Mood: stable. Remains on prozac.   HTN: stable. Remains on losartan.   GERD: Avoids fatty and spicy foods. It is controlled. Remains on prilosec.    Review of Systems:  Review of Systems  Constitutional:  Negative for weight loss.  HENT:  Negative for ear pain and hearing loss.   Eyes:  Negative for blurred vision and pain.  Respiratory:  Negative for cough, hemoptysis and shortness of breath.   Cardiovascular:  Negative for chest pain, palpitations and leg swelling.  Gastrointestinal:  Positive for heartburn. Negative for abdominal pain, blood in stool, constipation, diarrhea and melena.  Genitourinary:  Negative for dysuria and hematuria.  Musculoskeletal:  Negative for back pain and myalgias.  Skin:  Negative for itching and rash.  Neurological:  Negative for dizziness and headaches.  Psychiatric/Behavioral:  Negative for depression. The patient is not nervous/anxious.     Past  Medical History:  Diagnosis Date   Anxiety    Depression    HLD (hyperlipidemia)    HTN (hypertension)    Hx: UTI (urinary tract infection)    Internal hemorrhoids 11/13/08   Migraine headache 05/07/2011   h/o atypical migraines   Rosacea    Vertigo    Past Surgical History:  Procedure Laterality Date   COLONOSCOPY  11/13/2008   internal hemrrhoids   Social History:   reports that she quit smoking about 45 years ago. Her smoking use included cigarettes. She has a 2.00 pack-year smoking history. She has never used smokeless tobacco. She reports current alcohol use of about 1.0 standard drink of alcohol per week. She reports that she does not use drugs.  Family History  Problem Relation Age of Onset   COPD Mother    Heart disease Father    Colon cancer Neg Hx    Colon polyps Neg Hx    Esophageal cancer Neg Hx    Stomach cancer Neg Hx    Rectal cancer Neg Hx     Medications: Patient's Medications  New Prescriptions   No medications on file  Previous Medications   ASPIRIN 81 MG TABLET    Take 81 mg by mouth daily.     ATORVASTATIN (LIPITOR) 40 MG TABLET    TAKE 1 TABLET BY MOUTH DAILY   B COMPLEX VITAMINS TABLET    Take 1 tablet by   mouth daily.   CALCIUM CARBONATE (CALCIUM 600) 600 MG TABS TABLET    Take 1 tablet (600 mg total) by mouth 2 (two) times daily with a meal.   CHOLECALCIFEROL 50 MCG (2000 UT) CAPS    Take 1 capsule by mouth daily.   FAMOTIDINE (PEPCID) 10 MG TABLET    Take 10 mg by mouth at bedtime.   FLUOXETINE (PROZAC) 20 MG CAPSULE    TAKE 1 CAPSULE BY MOUTH DAILY   LOSARTAN-HYDROCHLOROTHIAZIDE (HYZAAR) 100-12.5 MG TABLET    TAKE 1 TABLET BY MOUTH DAILY   OMEGA-3 FATTY ACIDS (FISH OIL PO)    Take 2 capsules by mouth daily.  Modified Medications   No medications on file  Discontinued Medications   BENZONATATE (TESSALON PERLES) 100 MG CAPSULE    Take 1 capsule (100 mg total) by mouth 3 (three) times daily as needed for cough.   LORATADINE (CLARITIN) 10 MG TABLET     Take 1 tablet (10 mg total) by mouth daily for 14 days.   TRIAMCINOLONE CREAM (KENALOG) 0.1 %    Apply 1 application. topically 2 (two) times daily.    Physical Exam:  Vitals:   01/16/22 0808  BP: 134/88  Pulse: 68  Temp: (!) 97.3 F (36.3 C)  SpO2: 97%  Weight: 78.5 kg  Height: 5' 6" (1.676 m)   Body mass index is 27.92 kg/m. Wt Readings from Last 3 Encounters:  01/16/22 78.5 kg  08/26/21 78.9 kg  07/08/21 78.9 kg    Physical Exam Constitutional:      General: She is not in acute distress.    Appearance: She is not toxic-appearing.  HENT:     Mouth/Throat:     Mouth: Mucous membranes are moist.     Pharynx: Oropharynx is clear.  Eyes:     Pupils: Pupils are equal, round, and reactive to light.  Cardiovascular:     Rate and Rhythm: Normal rate and regular rhythm.     Pulses: Normal pulses.     Heart sounds: Normal heart sounds. No murmur heard. Pulmonary:     Effort: Pulmonary effort is normal. No respiratory distress.     Breath sounds: Normal breath sounds. No wheezing.  Abdominal:     General: Bowel sounds are normal.     Palpations: Abdomen is soft.     Tenderness: There is no abdominal tenderness.  Musculoskeletal:     Right lower leg: No edema.     Left lower leg: No edema.  Neurological:     Mental Status: She is alert and oriented to person, place, and time. Mental status is at baseline.  Psychiatric:        Mood and Affect: Mood normal.        Behavior: Behavior normal.     Labs reviewed: Basic Metabolic Panel: Recent Labs    07/12/21 0834  NA 141  K 4.5  CL 103  CO2 30  GLUCOSE 93  BUN 17  CREATININE 0.90  CALCIUM 9.8   Liver Function Tests: Recent Labs    07/12/21 0834  AST 23  ALT 27  BILITOT 0.6  PROT 6.6   No results for input(s): "LIPASE", "AMYLASE" in the last 8760 hours. No results for input(s): "AMMONIA" in the last 8760 hours. CBC: Recent Labs    07/12/21 0834  WBC 6.9  NEUTROABS 4,278  HGB 15.0  HCT 45.4*   MCV 89.0  PLT 346   Lipid Panel: Recent Labs    07/12/21 0834    CHOL 221*  HDL 55  LDLCALC 133*  TRIG 194*  CHOLHDL 4.0   TSH: No results for input(s): "TSH" in the last 8760 hours. A1C: Lab Results  Component Value Date   HGBA1C 5.4 06/13/2013     Assessment/Plan 1. Mixed hyperlipidemia - continue lipitor with dietary modifications - Lipid panel - CMP with eGFR([facility])  2. Need for influenza vaccination - received in office today - Flu Vaccine QUAD High Dose(Fluad)  3. Primary hypertension - controlled - continue losartan- hydrochlorothiazide with dietary modifications - CBC with Differential/Platelet - CMP with eGFR([facility])  4. Need for pneumococcal 20-valent conjugate vaccination - received in office today  - Pneumococcal conjugate vaccine 20-valent (Prevnar 20)  5. Depression, major, in remission (HCC) - controlled  - continue prozac with lifestyle modifications   6. Heartburn - controlled  - continue pepcid  7. Overweight (BMI 25.0-29.9) - encourage lifestyle modifications: continued exercise, diet changes.    Return in about 6 months (around 07/18/2022) for routine follow up .  Student- Diamond Nowell, RN I personally was present during the history, physical exam and medical decision-making activities of this service and have verified that the service and findings are accurately documented in the student's note  Jessica K. Eubanks, AGNP  [facility] [phone]   

## 2022-01-25 ENCOUNTER — Telehealth: Payer: Self-pay

## 2022-01-25 ENCOUNTER — Encounter: Payer: Self-pay | Admitting: Adult Health

## 2022-01-25 ENCOUNTER — Encounter: Payer: Medicare Other | Admitting: Adult Health

## 2022-01-25 NOTE — Telephone Encounter (Signed)
Patient states that she hurt her foot and would like to been seen by Nurse Practitioner. Patient thinks she might have broken toes but is requesting X-ray. Patient scheduled to see Ok Edwards, NP today 01/25/2022. Ok Edwards, NP called and notified.

## 2022-01-25 NOTE — Progress Notes (Signed)
This encounter was created in error - please disregard.

## 2022-05-06 ENCOUNTER — Other Ambulatory Visit: Payer: Self-pay | Admitting: Nurse Practitioner

## 2022-05-06 DIAGNOSIS — F325 Major depressive disorder, single episode, in full remission: Secondary | ICD-10-CM

## 2022-07-06 ENCOUNTER — Ambulatory Visit (INDEPENDENT_AMBULATORY_CARE_PROVIDER_SITE_OTHER): Payer: Medicare Other | Admitting: Orthopedic Surgery

## 2022-07-06 ENCOUNTER — Encounter: Payer: Self-pay | Admitting: Orthopedic Surgery

## 2022-07-06 VITALS — BP 120/72 | HR 79 | Temp 97.6°F | Resp 16 | Ht 66.0 in | Wt 170.8 lb

## 2022-07-06 DIAGNOSIS — R197 Diarrhea, unspecified: Secondary | ICD-10-CM

## 2022-07-06 NOTE — Patient Instructions (Addendum)
Please take famotidine (pepcid) twice daily x 1 week  Recommend starting probiotic x 1 month  Stay hydrated> gatorade and water   BRAT diet> bananas, rice, applesauce and toast  Please contact PCP if symptoms worsen or do not resolve

## 2022-07-06 NOTE — Progress Notes (Signed)
Careteam: Patient Care Team: Michele Chandler, NP as PCP - General (Geriatric Medicine)  Seen by: Windell Moulding, AGNP-C  PLACE OF SERVICE:  [place] Directive information    Allergies  Allergen Reactions   Amlodipine Other (See Comments)    Pt does not recall   Desipramine Other (See Comments)    Pt does not recall    Doxycycline Nausea And Vomiting   Lisinopril Cough   Sulfonamide Derivatives Other (See Comments)    "i can't remember the reaction"    Chief Complaint  Patient presents with   Acute Visit    Patient complains of blood in stool and stomach cramps.      HPI: Patient is a 67 y.o. female seen today for acute visit due to blood in stool and abdominal cramping.   Last night she ate Chicken Salad Chick for dinner. She began to have diarrhea and abdominal cramping at midnight. This morning she noticed a lot of blood during another episode of diarrhea. She denies fever, nausea, vomiting, black tarry stools. No recent travel. She did have a friend who had norovirus recently. Others who ate dinner with her last night are not sick at this time. Afebrile. Vitals stable.    Review of Systems:  Review of Systems  Constitutional:  Negative for chills and fever.  HENT:  Negative for congestion.   Respiratory:  Negative for cough.   Cardiovascular:  Negative for chest pain.  Gastrointestinal:  Positive for abdominal pain, blood in stool and diarrhea. Negative for constipation, nausea and vomiting.  Genitourinary:  Negative for dysuria.  Musculoskeletal:  Negative for myalgias.  Neurological:  Negative for dizziness, weakness and headaches.  Psychiatric/Behavioral:  Negative for depression. The patient is not nervous/anxious.     Past Medical History:  Diagnosis Date   Anxiety    Depression    HLD (hyperlipidemia)    HTN (hypertension)    Hx: UTI (urinary tract infection)    Internal hemorrhoids 11/13/08   Migraine headache 05/07/2011   h/o atypical  migraines   Rosacea    Vertigo    Past Surgical History:  Procedure Laterality Date   COLONOSCOPY  11/13/2008   internal hemrrhoids   Social History:   reports that she quit smoking about 46 years ago. Her smoking use included cigarettes. She has a 2.00 pack-year smoking history. She has never used smokeless tobacco. She reports current alcohol use of about 1.0 standard drink of alcohol per week. She reports that she does not use drugs.  Family History  Problem Relation Age of Onset   COPD Mother    Heart disease Father    Colon cancer Neg Hx    Colon polyps Neg Hx    Esophageal cancer Neg Hx    Stomach cancer Neg Hx    Rectal cancer Neg Hx     Medications: Patient's Medications  New Prescriptions   No medications on file  Previous Medications   ASPIRIN 81 MG TABLET    Take 81 mg by mouth daily.     ATORVASTATIN (LIPITOR) 40 MG TABLET    TAKE 1 TABLET BY MOUTH DAILY   B COMPLEX VITAMINS TABLET    Take 1 tablet by mouth daily.   CALCIUM CARBONATE (CALCIUM 600) 600 MG TABS TABLET    Take 1 tablet (600 mg total) by mouth 2 (two) times daily with a meal.   CHOLECALCIFEROL 50 MCG (2000 UT) CAPS    Take 1 capsule by mouth  daily.   FAMOTIDINE (PEPCID) 10 MG TABLET    Take 10 mg by mouth at bedtime.   FLUOXETINE (PROZAC) 20 MG CAPSULE    TAKE 1 CAPSULE BY MOUTH DAILY   LOSARTAN-HYDROCHLOROTHIAZIDE (HYZAAR) 100-12.5 MG TABLET    TAKE 1 TABLET BY MOUTH DAILY   OMEGA-3 FATTY ACIDS (FISH OIL PO)    Take 2 capsules by mouth daily.  Modified Medications   No medications on file  Discontinued Medications   No medications on file    Physical Exam:  Vitals:   07/06/22 1337  BP: 120/72  Pulse: 79  Resp: 16  Temp: 97.6 F (36.4 C)  SpO2: 94%  Weight: 170 lb 12.8 oz (77.5 kg)  Height: 5\' 6"  (1.676 m)   Body mass index is 27.57 kg/m. Wt Readings from Last 3 Encounters:  07/06/22 170 lb 12.8 oz (77.5 kg)  01/25/22 172 lb (78 kg)  01/16/22 173 lb (78.5 kg)    Physical  Exam Vitals reviewed.  Constitutional:      General: She is not in acute distress. HENT:     Head: Normocephalic.     Mouth/Throat:     Mouth: Mucous membranes are moist.  Eyes:     General:        Right eye: No discharge.        Left eye: No discharge.  Cardiovascular:     Rate and Rhythm: Normal rate and regular rhythm.     Pulses: Normal pulses.     Heart sounds: Normal heart sounds.  Pulmonary:     Effort: Pulmonary effort is normal.     Breath sounds: Normal breath sounds.  Abdominal:     General: Bowel sounds are normal. There is no distension.     Palpations: Abdomen is soft.     Tenderness: There is no abdominal tenderness. There is no guarding.  Genitourinary:    Comments: Anal opening opening with excoriated skin, no hemorrhoids Musculoskeletal:     Cervical back: Neck supple.     Right lower leg: No edema.     Left lower leg: No edema.  Skin:    General: Skin is warm and dry.     Capillary Refill: Capillary refill takes less than 2 seconds.  Neurological:     General: No focal deficit present.     Mental Status: She is alert and oriented to person, place, and time.  Psychiatric:        Mood and Affect: Mood normal.        Behavior: Behavior normal.     Labs reviewed: Basic Metabolic Panel: Recent Labs    07/12/21 0834 01/16/22 0852  NA 141 141  K 4.5 4.5  CL 103 102  CO2 30 32  GLUCOSE 93 95  BUN 17 16  CREATININE 0.90 0.83  CALCIUM 9.8 9.9   Liver Function Tests: Recent Labs    07/12/21 0834 01/16/22 0852  AST 23 21  ALT 27 23  BILITOT 0.6 0.6  PROT 6.6 6.7   No results for input(s): "LIPASE", "AMYLASE" in the last 8760 hours. No results for input(s): "AMMONIA" in the last 8760 hours. CBC: Recent Labs    07/12/21 0834 01/16/22 0852  WBC 6.9 7.9  NEUTROABS 4,278 5,372  HGB 15.0 14.6  HCT 45.4* 43.1  MCV 89.0 88.3  PLT 346 344   Lipid Panel: Recent Labs    07/12/21 0834 01/16/22 0852  CHOL 221* 179  HDL 55 56  LDLCALC  133* 95  TRIG 194* 189*  CHOLHDL 4.0 3.2   TSH: No results for input(s): "TSH" in the last 8760 hours. A1C: Lab Results  Component Value Date   HGBA1C 5.4 06/13/2013     Assessment/Plan 1. Diarrhea, unspecified type - diarrhea, abdominal cramping x 1 day - ate chicken salad chick with others, she is only one sick - contact with friend who had norovirus recently - afebrile, rectal exam normal, bowel sounds normal - do not suspect Hep A or infectious diarrhea - increase famotidine to BID x 1 week - recommend probiotic x 1 month - may try imodium prn - encourage hydration - BRAT diet - contact PCP if symptoms worsen or do not improve - CBC with Differential/Platelet - Basic Metabolic Panel with eGFR  Total time: 21 minutes. Greater than 50% of total time spent doing patient education regarding diarrhea and abdominal pain including symptom/medication management.    Next appt: none [facility] [place], [facility] [phone]

## 2022-07-07 LAB — BASIC METABOLIC PANEL WITH GFR
BUN: 19 mg/dL (ref 7–25)
CO2: 29 mmol/L (ref 20–32)
Calcium: 10 mg/dL (ref 8.6–10.4)
Chloride: 102 mmol/L (ref 98–110)
Creat: 0.91 mg/dL (ref 0.50–1.05)
Glucose, Bld: 85 mg/dL (ref 65–99)
Potassium: 4.1 mmol/L (ref 3.5–5.3)
Sodium: 142 mmol/L (ref 135–146)
eGFR: 69 mL/min/{1.73_m2} (ref >=60)

## 2022-07-07 LAB — CBC WITH DIFFERENTIAL/PLATELET
Absolute Monocytes: 752 cells/uL (ref 200–950)
Basophils Absolute: 33 cells/uL (ref 0–200)
Basophils Relative: 0.3 %
Eosinophils Absolute: 98 cells/uL (ref 15–500)
Eosinophils Relative: 0.9 %
HCT: 42.5 % (ref 35.0–45.0)
Hemoglobin: 14 g/dL (ref 11.7–15.5)
Lymphs Abs: 2213 cells/uL (ref 850–3900)
MCH: 28.6 pg (ref 27.0–33.0)
MCHC: 32.9 g/dL (ref 32.0–36.0)
MCV: 86.7 fL (ref 80.0–100.0)
MPV: 11.6 fL (ref 7.5–12.5)
Monocytes Relative: 6.9 %
Neutro Abs: 7804 cells/uL — ABNORMAL HIGH (ref 1500–7800)
Neutrophils Relative %: 71.6 %
Platelets: 362 10*3/uL (ref 140–400)
RBC: 4.9 10*6/uL (ref 3.80–5.10)
RDW: 12.2 % (ref 11.0–15.0)
Total Lymphocyte: 20.3 %
WBC: 10.9 10*3/uL — ABNORMAL HIGH (ref 3.8–10.8)

## 2022-08-10 ENCOUNTER — Ambulatory Visit (INDEPENDENT_AMBULATORY_CARE_PROVIDER_SITE_OTHER): Payer: Medicare Other | Admitting: Nurse Practitioner

## 2022-08-10 DIAGNOSIS — Z0000 Encounter for general adult medical examination without abnormal findings: Secondary | ICD-10-CM

## 2022-08-10 NOTE — Progress Notes (Signed)
Subjective:   Michele Lowery is a 67 y.o. female who presents for Medicare Annual (Subsequent) preventive examination.  Review of Systems     Cardiac Risk Factors include: advanced age (>56men, >6 women);dyslipidemia     Objective:    There were no vitals filed for this visit. There is no height or weight on file to calculate BMI.     08/10/2022   10:31 AM 07/06/2022    1:39 PM 08/04/2021    2:49 PM 07/08/2021    1:12 PM 03/18/2020    2:56 PM  Advanced Directives  Does Patient Have a Medical Advance Directive? Yes Yes Yes Yes No;Yes  Type of Estate agent of [place];Out of facility DNR (pink MOST or yellow form);Living will Healthcare Power of [place];Living will;Out of facility DNR (pink MOST or yellow form) Healthcare Power of [place];Living will Healthcare Power of [place];Living will Healthcare Power of [place];Living will  Does patient want to make changes to medical advance directive? No - Patient declined No - Patient declined No - Patient declined No - Patient declined   Copy of Healthcare Power of Attorney in Chart? No - copy requested No - copy requested No - copy requested No - copy requested   Would patient like information on creating a medical advance directive?     No - Patient declined    Current Medications (verified) Outpatient Encounter Medications as of 08/10/2022  Medication Sig   aspirin 81 MG tablet Take 81 mg by mouth daily.     atorvastatin (LIPITOR) 40 MG tablet TAKE 1 TABLET BY MOUTH DAILY   b complex vitamins tablet Take 1 tablet by mouth daily.   calcium carbonate (CALCIUM 600) 600 MG TABS tablet Take 1 tablet (600 mg total) by mouth 2 (two) times daily with a meal.   Cholecalciferol 50 MCG (2000 UT) CAPS Take 1 capsule by mouth daily.   famotidine (PEPCID) 10 MG tablet Take 10 mg by mouth at bedtime.   FLUoxetine (PROZAC) 20 MG capsule TAKE 1 CAPSULE BY MOUTH DAILY   losartan-hydrochlorothiazide (HYZAAR) 100-12.5 MG  tablet TAKE 1 TABLET BY MOUTH DAILY   Omega-3 Fatty Acids (FISH OIL PO) Take 2 capsules by mouth daily.   No facility-administered encounter medications on file as of 08/10/2022.    Allergies (verified) Amlodipine, Desipramine, Doxycycline, Lisinopril, and Sulfonamide derivatives   History: Past Medical History:  Diagnosis Date   Anxiety    Depression    HLD (hyperlipidemia)    HTN (hypertension)    Hx: UTI (urinary tract infection)    Internal hemorrhoids 11/13/08   Migraine headache 05/07/2011   h/o atypical migraines   Rosacea    Vertigo    Past Surgical History:  Procedure Laterality Date   COLONOSCOPY  11/13/2008   internal hemrrhoids   Family History  Problem Relation Age of Onset   COPD Mother    Heart disease Father    Colon cancer Neg Hx    Colon polyps Neg Hx    Esophageal cancer Neg Hx    Stomach cancer Neg Hx    Rectal cancer Neg Hx    Social History   Socioeconomic History   Marital status: Married    Spouse name: Not on file   Number of children: 2   Years of education: Not on file   Highest education level: Not on file  Occupational History   Occupation: Printmaker  Tobacco Use   Smoking status: Former    Packs/day: 0.50  Years: 4.00    Additional pack years: 0.00    Total pack years: 2.00    Types: Cigarettes    Quit date: 07/09/1976    Years since quitting: 46.1   Smokeless tobacco: Never  Vaping Use   Vaping Use: Never used  Substance and Sexual Activity   Alcohol use: Yes    Alcohol/week: 1.0 standard drink of alcohol    Types: 1 Standard drinks or equivalent per week    Comment: 1-2 per month    Drug use: No   Sexual activity: Not on file  Other Topics Concern   Not on file  Social History Narrative   caffeine drink daily   Water exercise   Gym about twice wk. On machines   Works full time.       Social Determinants of Health   Financial Resource Strain: Not on file  Food Insecurity: Not on file  Transportation  Needs: Not on file  Physical Activity: Not on file  Stress: Not on file  Social Connections: Not on file    Tobacco Counseling Counseling given: Not Answered   Clinical Intake:                 Diabetic?no         Activities of Daily Living    08/10/2022   10:40 AM  In your present state of health, do you have any difficulty performing the following activities:  Hearing? 0  Vision? 0  Difficulty concentrating or making decisions? 0  Walking or climbing stairs? 0  Dressing or bathing? 0  Doing errands, shopping? 0  Preparing Food and eating ? N  Using the Toilet? N  In the past six months, have you accidently leaked urine? Y  Do you have problems with loss of bowel control? N  Managing your Medications? N  Managing your Finances? N  Housekeeping or managing your Housekeeping? N    Patient Care Team: Sharon Seller, NP as PCP - General (Geriatric Medicine)  Indicate any recent Medical Services you may have received from other than [facility] providers in the past year (date may be approximate).     Assessment:   This is a routine wellness examination for [place].  Hearing/Vision screen Vision Screening - Comments:: [facility]. Last Visit 3 years ago  Dietary issues and exercise activities discussed: Current Exercise Habits: Structured exercise class;Home exercise routine, Time (Minutes): 60, Frequency (Times/Week): 1, Weekly Exercise (Minutes/Week): 60   Goals Addressed   None    Depression Screen    08/10/2022   10:33 AM 08/04/2021    2:46 PM 07/08/2021    1:11 PM 05/21/2019    8:16 AM 08/26/2018    8:42 AM 08/23/2018    3:51 PM 02/21/2018    9:31 AM  PHQ 2/9 Scores  PHQ - 2 Score 0 0 0 0 0 0 0  PHQ- 9 Score    2       Fall Risk    08/10/2022   10:33 AM 07/06/2022    1:39 PM 01/16/2022    8:19 AM 08/26/2021    9:20 AM 08/04/2021    2:48 PM  Fall Risk   Falls in the past year? 0 0 0 0 0  Number falls in past yr: 0 0 0 0 0  Injury with Fall? 1 0  0 0 0  Risk for fall due to :  No Fall Risks No Fall Risks No Fall Risks No Fall Risks  Follow up  Falls evaluation completed  Falls evaluation completed Falls evaluation completed    FALL RISK PREVENTION PERTAINING TO THE HOME:  Any stairs in or around the home? Yes  If so, are there any without handrails? No  Home free of loose throw rugs in walkways, pet beds, electrical cords, etc? Yes  Adequate lighting in your home to reduce risk of falls? Yes   ASSISTIVE DEVICES UTILIZED TO PREVENT FALLS:  Life alert? No  Use of a cane, walker or w/c? No  Grab bars in the bathroom? No  Shower chair or bench in shower? No  Elevated toilet seat or a handicapped toilet? No   TIMED UP AND GO:  Was the test performed? No .   Cognitive Function:        08/10/2022   10:33 AM 08/04/2021    2:49 PM  6CIT Screen  What Year? 0 points 0 points  What month? 0 points 0 points  What time? 0 points 0 points  Count back from 20 0 points 0 points  Months in reverse 0 points 0 points  Repeat phrase 0 points 2 points  Total Score 0 points 2 points    Immunizations Immunization History  Administered Date(s) Administered   Fluad Quad(high Dose 65+) 01/16/2022   Influenza Inj Mdck Quad Pf 02/10/2018   Influenza Split 03/14/2013   Influenza,inj,Quad PF,6+ Mos 12/21/2014, 01/27/2017   Influenza-Unspecified 02/14/2019   PFIZER(Purple Top)SARS-COV-2 Vaccination 05/16/2019, 06/11/2019, 01/29/2020   PNEUMOCOCCAL CONJUGATE-20 01/16/2022   Td 02/17/2004   Tdap 12/11/2012   Zoster, Live 01/21/2014    TDAP status: Up to date  Flu Vaccine status: Up to date  Pneumococcal vaccine status: Up to date  Covid-19 vaccine status: Information provided on how to obtain vaccines.   Qualifies for Shingles Vaccine? Yes   Zostavax completed No   Shingrix Completed?: No.    Education has been provided regarding the importance of this vaccine. Patient has been advised to call insurance company to determine out  of pocket expense if they have not yet received this vaccine. Advised may also receive vaccine at local pharmacy or Health Dept. Verbalized acceptance and understanding.  Screening Tests Health Maintenance  Topic Date Due   Zoster Vaccines- Shingrix (1 of 2) Never done   COVID-19 Vaccine (4 - 2023-24 season) 09/09/2027 (Originally 12/09/2021)   INFLUENZA VACCINE  11/09/2022   DTaP/Tdap/Td (3 - Td or Tdap) 12/12/2022   Medicare Annual Wellness (AWV)  08/10/2023   MAMMOGRAM  08/23/2023   COLONOSCOPY (Pts 45-66yrs Insurance coverage will need to be confirmed)  03/23/2029   Pneumonia Vaccine 106+ Years old  Completed   DEXA SCAN  Completed   Hepatitis C Screening  Completed   HPV VACCINES  Aged Out    Health Maintenance  Health Maintenance Due  Topic Date Due   Zoster Vaccines- Shingrix (1 of 2) Never done    Colorectal cancer screening: Type of screening: Colonoscopy. Completed 2020. Repeat every 10 years  Mammogram status: Completed 08/2021. Repeat every year  Bone Density status: Completed 07/11/2021. Results reflect: Bone density results: OSTEOPENIA. Repeat every 2 years.  Lung Cancer Screening: (Low Dose CT Chest recommended if Age 25-80 years, 30 pack-year currently smoking OR have quit w/in 15years.) does not qualify.   Lung Cancer Screening Referral: na   Additional Screening:  Hepatitis C Screening: does qualify; Completed   Vision Screening: Recommended annual ophthalmology exams for early detection of glaucoma and other disorders of the eye. Is the patient up to date  with their annual eye exam?  Yes  Who is the provider or what is the name of the office in which the patient attends annual eye exams? [facility]  If pt is not established with a provider, would they like to be referred to a provider to establish care? No .   Dental Screening: Recommended annual dental exams for proper oral hygiene  Community Resource Referral / Chronic Care Management: CRR required this  visit?  No   CCM required this visit?  No      Plan:     I have personally reviewed and noted the following in the patient's chart:   Medical and social history Use of alcohol, tobacco or illicit drugs  Current medications and supplements including opioid prescriptions. Patient is not currently taking opioid prescriptions. Functional ability and status Nutritional status Physical activity Advanced directives List of other physicians Hospitalizations, surgeries, and ER visits in previous 12 months Vitals Screenings to include cognitive, depression, and falls Referrals and appointments  In addition, I have reviewed and discussed with patient certain preventive protocols, quality metrics, and best practice recommendations. A written personalized care plan for preventive services as well as general preventive health recommendations were provided to patient.     Sharon Seller, NP   08/10/2022   Virtual Visit via Video Note  I connected with Michele Lowery on 08/10/22 at 11:00 AM EDT by a video enabled telemedicine application and verified that I am speaking with the correct person using two identifiers.  Location: Patient: work Provider: [facility]    I discussed the limitations of evaluation and management by telemedicine and the availability of in person appointments. The patient expressed understanding and agreed to proceed.    I discussed the assessment and treatment plan with the patient. The patient was provided an opportunity to ask questions and all were answered. The patient agreed with the plan and demonstrated an understanding of the instructions.   The patient was advised to call back or seek an in-person evaluation if the symptoms worsen or if the condition fails to improve as anticipated.  I provided 20 minutes of non-face-to-face time during this encounter.  Michele Lowery, AGNP Avs printed and mailed.

## 2022-08-10 NOTE — Progress Notes (Signed)
  This service is provided via telemedicine  No vital signs collected/recorded due to the encounter was a telemedicine visit.   Location of patient (ex: home, work):  Home  Patient consents to a telephone visit:  Yes  Location of the provider (ex: office, home):  [facility]  Name of any referring provider:  na  Names of all persons participating in the telemedicine service and their role in the encounter:  Lajuana Ripple, Patient, Nelda Severe, CMA, Abbey Chatters, NP  Time spent on call:  7:45

## 2022-08-29 ENCOUNTER — Other Ambulatory Visit: Payer: Self-pay | Admitting: Nurse Practitioner

## 2022-08-29 DIAGNOSIS — E785 Hyperlipidemia, unspecified: Secondary | ICD-10-CM

## 2022-08-29 DIAGNOSIS — I10 Essential (primary) hypertension: Secondary | ICD-10-CM

## 2022-12-27 ENCOUNTER — Other Ambulatory Visit: Payer: Self-pay | Admitting: Nurse Practitioner

## 2022-12-27 DIAGNOSIS — Z1231 Encounter for screening mammogram for malignant neoplasm of breast: Secondary | ICD-10-CM

## 2023-01-05 ENCOUNTER — Ambulatory Visit: Payer: Medicare Other | Admitting: Nurse Practitioner

## 2023-01-11 ENCOUNTER — Encounter: Payer: Self-pay | Admitting: Nurse Practitioner

## 2023-01-12 ENCOUNTER — Encounter: Payer: Self-pay | Admitting: Nurse Practitioner

## 2023-01-12 ENCOUNTER — Ambulatory Visit (INDEPENDENT_AMBULATORY_CARE_PROVIDER_SITE_OTHER): Payer: Medicare Other | Admitting: Nurse Practitioner

## 2023-01-12 VITALS — BP 114/68 | HR 65 | Temp 97.0°F | Ht 65.5 in | Wt 171.0 lb

## 2023-01-12 DIAGNOSIS — Z23 Encounter for immunization: Secondary | ICD-10-CM

## 2023-01-12 DIAGNOSIS — F325 Major depressive disorder, single episode, in full remission: Secondary | ICD-10-CM

## 2023-01-12 DIAGNOSIS — R12 Heartburn: Secondary | ICD-10-CM

## 2023-01-12 DIAGNOSIS — E782 Mixed hyperlipidemia: Secondary | ICD-10-CM

## 2023-01-12 DIAGNOSIS — I10 Essential (primary) hypertension: Secondary | ICD-10-CM | POA: Diagnosis not present

## 2023-01-12 MED ORDER — PANTOPRAZOLE SODIUM 40 MG PO TBEC: 40.0000 mg | DELAYED_RELEASE_TABLET | Freq: Every day | ORAL | 1 refills | Status: DC

## 2023-01-12 NOTE — Patient Instructions (Signed)
Only use pepcid as needed- take if you are having symptom  Start protonix 40 mg daily in the morning- may take up to  4 weeks to work.

## 2023-01-12 NOTE — Progress Notes (Signed)
Careteam: Patient Care Team: Michele Seller, NP as PCP - General (Geriatric Medicine)  PLACE OF SERVICE:  [facility]  Advanced Directive information Does Patient Have a Medical Advance Directive?: Yes, Type of Advance Directive: Healthcare Power of [name];Living will, Does patient want to make changes to medical advance directive?: No - Patient declined  Allergies  Allergen Reactions   Amlodipine Other (See Comments)    Pt does not recall   Desipramine Other (See Comments)    Pt does not recall    Doxycycline Nausea And Vomiting   Lisinopril Cough   Sulfonamide Derivatives Other (See Comments)    "i can't remember the reaction"    Chief Complaint  Patient presents with   Medicare Wellness    Routine follow-up. Discuss need for shingrix, covid booster, and td/tdap. NCIR verified. Patient c/o inside of ears itching.      HPI: Patient is a 67 y.o. female for routine follow up.   Her birthday was yesterday.   She still works. Enjoys her job.   Blood pressure well controlled.   Outside of ear is itchy, has a cream that dermatologist gave her  Ongoing heartburn, takes pepcid at night but continues to have  Mood has been doing well.   Exercising routinely  Has mammogram schedule.   Review of Systems:  Review of Systems  Constitutional:  Negative for chills, fever and weight loss.  HENT:  Negative for tinnitus.   Respiratory:  Negative for cough, sputum production and shortness of breath.   Cardiovascular:  Negative for chest pain, palpitations and leg swelling.  Gastrointestinal:  Negative for abdominal pain, constipation, diarrhea and heartburn.  Genitourinary:  Negative for dysuria, frequency and urgency.  Musculoskeletal:  Negative for back pain, falls, joint pain and myalgias.  Skin: Negative.   Neurological:  Negative for dizziness and headaches.  Psychiatric/Behavioral:  Negative for depression and memory loss. The patient does not have insomnia.      Past Medical History:  Diagnosis Date   Anxiety    Depression    HLD (hyperlipidemia)    HTN (hypertension)    Hx: UTI (urinary tract infection)    Internal hemorrhoids 11/13/08   Migraine headache 05/07/2011   h/o atypical migraines   Rosacea    Vertigo    Past Surgical History:  Procedure Laterality Date   COLONOSCOPY  11/13/2008   internal hemrrhoids   Social History:   reports that she quit smoking about 46 years ago. Her smoking use included cigarettes. She started smoking about 50 years ago. She has a 2 pack-year smoking history. She has never used smokeless tobacco. She reports current alcohol use of about 1.0 standard drink of alcohol per week. She reports that she does not use drugs.  Family History  Problem Relation Age of Onset   COPD Mother    Heart disease Father    Colon cancer Neg Hx    Colon polyps Neg Hx    Esophageal cancer Neg Hx    Stomach cancer Neg Hx    Rectal cancer Neg Hx     Medications: Patient's Medications  New Prescriptions   No medications on file  Previous Medications   ASPIRIN 81 MG TABLET    Take 81 mg by mouth daily.     ATORVASTATIN (LIPITOR) 40 MG TABLET    TAKE 1 TABLET BY MOUTH DAILY   B COMPLEX VITAMINS TABLET    Take 1 tablet by mouth daily.   CALCIUM CARBONATE (CALCIUM 600)  600 MG TABS TABLET    Take 1 tablet (600 mg total) by mouth 2 (two) times daily with a meal.   CHOLECALCIFEROL 50 MCG (2000 UT) CAPS    Take 1 capsule by mouth daily.   FAMOTIDINE (PEPCID) 10 MG TABLET    Take 10 mg by mouth at bedtime.   FLUOXETINE (PROZAC) 20 MG CAPSULE    TAKE 1 CAPSULE BY MOUTH DAILY   LOSARTAN-HYDROCHLOROTHIAZIDE (HYZAAR) 100-12.5 MG TABLET    TAKE 1 TABLET BY MOUTH DAILY   OMEGA-3 FATTY ACIDS (FISH OIL PO)    Take 2 capsules by mouth daily.   UNABLE TO FIND    Med Name: Cream prescribed by dermatology as needed  Modified Medications   No medications on file  Discontinued Medications   No medications on file    Physical  Exam:  Vitals:   01/12/23 0816  BP: 114/68  Pulse: 65  Temp: (!) 97 F (36.1 C)  TempSrc: Temporal  SpO2: 95%  Weight: 171 lb (77.6 kg)  Height: 5' 5.5" (1.664 m)   Body mass index is 28.02 kg/m. Wt Readings from Last 3 Encounters:  01/12/23 171 lb (77.6 kg)  07/06/22 170 lb 12.8 oz (77.5 kg)  01/25/22 172 lb (78 kg)    Physical Exam Constitutional:      General: She is not in acute distress.    Appearance: She is well-developed. She is not diaphoretic.  HENT:     Head: Normocephalic and atraumatic.     Mouth/Throat:     Pharynx: No oropharyngeal exudate.  Eyes:     Conjunctiva/sclera: Conjunctivae normal.     Pupils: Pupils are equal, round, and reactive to light.  Cardiovascular:     Rate and Rhythm: Normal rate and regular rhythm.     Heart sounds: Normal heart sounds.  Pulmonary:     Effort: Pulmonary effort is normal.     Breath sounds: Normal breath sounds.  Abdominal:     General: Bowel sounds are normal.     Palpations: Abdomen is soft.  Musculoskeletal:     Cervical back: Normal range of motion and neck supple.     Right lower leg: No edema.     Left lower leg: No edema.  Skin:    General: Skin is warm and dry.  Neurological:     Mental Status: She is alert and oriented to person, place, and time.     Motor: No weakness.     Gait: Gait normal.  Psychiatric:        Mood and Affect: Mood normal.     Labs reviewed: Basic Metabolic Panel: Recent Labs    01/16/22 0852 07/06/22 1400  NA 141 142  K 4.5 4.1  CL 102 102  CO2 32 29  GLUCOSE 95 85  BUN 16 19  CREATININE 0.83 0.91  CALCIUM 9.9 10.0   Liver Function Tests: Recent Labs    01/16/22 0852  AST 21  ALT 23  BILITOT 0.6  PROT 6.7   No results for input(s): "LIPASE", "AMYLASE" in the last 8760 hours. No results for input(s): "AMMONIA" in the last 8760 hours. CBC: Recent Labs    01/16/22 0852 07/06/22 1400  WBC 7.9 10.9*  NEUTROABS 5,372 7,804*  HGB 14.6 14.0  HCT 43.1 42.5   MCV 88.3 86.7  PLT 344 362   Lipid Panel: Recent Labs    01/16/22 0852  CHOL 179  HDL 56  LDLCALC 95  TRIG 189*  CHOLHDL 3.2  TSH: No results for input(s): "TSH" in the last 8760 hours. A1C: Lab Results  Component Value Date   HGBA1C 5.4 06/13/2013     Assessment/Plan 1. Mixed hyperlipidemia -continues on lipitor with dietary modifications - Lipid panel - COMPLETE METABOLIC PANEL WITH GFR  2. Need for influenza vaccination - Flu Vaccine Trivalent High Dose (Fluad)  3. Primary hypertension -Blood pressure well controlled, goal bp <140/90 Continue current medications and dietary modifications follow metabolic panel - COMPLETE METABOLIC PANEL WITH GFR - CBC with Differential/Platelet  4. Depression, major, in remission (HCC) Well controlled on prozac  5. Heartburn Dietary modifications encouraged,  To use pepcid as needed (take when having issues if needed) - pantoprazole (PROTONIX) 40 MG tablet; Take 1 tablet (40 mg total) by mouth daily.  Dispense: 90 tablet; Refill: 1    Return in about 6 months (around 07/13/2023) for routine follow up.  Michele Lowery. Biagio Borg [facility] [phone]

## 2023-01-13 LAB — COMPLETE METABOLIC PANEL WITH GFR
AG Ratio: 2.1 (calc) (ref 1.0–2.5)
ALT: 21 U/L (ref 6–29)
AST: 15 U/L (ref 10–35)
Albumin: 4.4 g/dL (ref 3.6–5.1)
Alkaline phosphatase (APISO): 93 U/L (ref 37–153)
BUN: 21 mg/dL (ref 7–25)
CO2: 32 mmol/L (ref 20–32)
Calcium: 9.7 mg/dL (ref 8.6–10.4)
Chloride: 102 mmol/L (ref 98–110)
Creat: 0.89 mg/dL (ref 0.50–1.05)
Globulin: 2.1 g/dL (ref 1.9–3.7)
Glucose, Bld: 92 mg/dL (ref 65–99)
Potassium: 4.6 mmol/L (ref 3.5–5.3)
Sodium: 142 mmol/L (ref 135–146)
Total Bilirubin: 0.7 mg/dL (ref 0.2–1.2)
Total Protein: 6.5 g/dL (ref 6.1–8.1)
eGFR: 70 mL/min/{1.73_m2} (ref >=60)

## 2023-01-13 LAB — LIPID PANEL
Cholesterol: 171 mg/dL (ref <200)
HDL: 56 mg/dL (ref >=50)
LDL Cholesterol (Calc): 92 mg/dL
Non-HDL Cholesterol (Calc): 115 mg/dL (ref <130)
Total CHOL/HDL Ratio: 3.1 (calc) (ref <5.0)
Triglycerides: 136 mg/dL (ref <150)

## 2023-01-13 LAB — CBC WITH DIFFERENTIAL/PLATELET
Absolute Monocytes: 587 {cells}/uL (ref 200–950)
Basophils Absolute: 53 {cells}/uL (ref 0–200)
Basophils Relative: 0.8 %
Eosinophils Absolute: 158 {cells}/uL (ref 15–500)
Eosinophils Relative: 2.4 %
HCT: 44.4 % (ref 35.0–45.0)
Hemoglobin: 14.2 g/dL (ref 11.7–15.5)
Lymphs Abs: 1868 {cells}/uL (ref 850–3900)
MCH: 28.7 pg (ref 27.0–33.0)
MCHC: 32 g/dL (ref 32.0–36.0)
MCV: 89.7 fL (ref 80.0–100.0)
MPV: 11.8 fL (ref 7.5–12.5)
Monocytes Relative: 8.9 %
Neutro Abs: 3934 {cells}/uL (ref 1500–7800)
Neutrophils Relative %: 59.6 %
Platelets: 340 10*3/uL (ref 140–400)
RBC: 4.95 10*6/uL (ref 3.80–5.10)
RDW: 12.2 % (ref 11.0–15.0)
Total Lymphocyte: 28.3 %
WBC: 6.6 10*3/uL (ref 3.8–10.8)

## 2023-01-17 ENCOUNTER — Ambulatory Visit
Admission: RE | Admit: 2023-01-17 | Discharge: 2023-01-17 | Disposition: A | Discharge status: Home / Self Care | Payer: Medicare Other | Source: Ambulatory Visit | Attending: Nurse Practitioner | Admitting: Nurse Practitioner

## 2023-01-17 DIAGNOSIS — Z1231 Encounter for screening mammogram for malignant neoplasm of breast: Secondary | ICD-10-CM | POA: Diagnosis not present

## 2023-02-10 ENCOUNTER — Other Ambulatory Visit: Payer: Self-pay | Admitting: Nurse Practitioner

## 2023-02-10 DIAGNOSIS — I10 Essential (primary) hypertension: Secondary | ICD-10-CM

## 2023-02-10 DIAGNOSIS — E785 Hyperlipidemia, unspecified: Secondary | ICD-10-CM

## 2023-03-16 ENCOUNTER — Other Ambulatory Visit: Payer: Self-pay | Admitting: Nurse Practitioner

## 2023-03-16 DIAGNOSIS — F325 Major depressive disorder, single episode, in full remission: Secondary | ICD-10-CM

## 2023-03-22 ENCOUNTER — Other Ambulatory Visit: Payer: Self-pay | Admitting: Nurse Practitioner

## 2023-03-22 DIAGNOSIS — R12 Heartburn: Secondary | ICD-10-CM

## 2023-07-13 ENCOUNTER — Ambulatory Visit (INDEPENDENT_AMBULATORY_CARE_PROVIDER_SITE_OTHER): Payer: Medicare Other | Admitting: Nurse Practitioner

## 2023-07-13 ENCOUNTER — Encounter: Payer: Self-pay | Admitting: Nurse Practitioner

## 2023-07-13 VITALS — BP 110/70 | HR 67 | Temp 97.1°F | Resp 16 | Ht 65.5 in | Wt 170.4 lb

## 2023-07-13 DIAGNOSIS — H9213 Otorrhea, bilateral: Secondary | ICD-10-CM

## 2023-07-13 DIAGNOSIS — J301 Allergic rhinitis due to pollen: Secondary | ICD-10-CM | POA: Diagnosis not present

## 2023-07-13 DIAGNOSIS — E2839 Other primary ovarian failure: Secondary | ICD-10-CM | POA: Diagnosis not present

## 2023-07-13 DIAGNOSIS — F325 Major depressive disorder, single episode, in full remission: Secondary | ICD-10-CM

## 2023-07-13 DIAGNOSIS — R3915 Urgency of urination: Secondary | ICD-10-CM

## 2023-07-13 DIAGNOSIS — I10 Essential (primary) hypertension: Secondary | ICD-10-CM

## 2023-07-13 DIAGNOSIS — M8580 Other specified disorders of bone density and structure, unspecified site: Secondary | ICD-10-CM

## 2023-07-13 DIAGNOSIS — E782 Mixed hyperlipidemia: Secondary | ICD-10-CM | POA: Diagnosis not present

## 2023-07-13 NOTE — Progress Notes (Signed)
 Careteam: Patient Care Team: Sharon Seller, NP as PCP - General (Geriatric Medicine)  PLACE OF SERVICE:  [facility]  Advanced Directive information Does Patient Have a Medical Advance Directive?: Yes, Type of Advance Directive: Healthcare Power of [place];Living will;Out of facility DNR (pink MOST or yellow form), Does patient want to make changes to medical advance directive?: No - Patient declined  Allergies  Allergen Reactions   Amlodipine Other (See Comments)    Pt does not recall   Bee Pollen    Desipramine Other (See Comments)    Pt does not recall    Doxycycline Nausea And Vomiting   Lisinopril Cough   Sulfonamide Derivatives Other (See Comments)    "i can't remember the reaction"    Chief Complaint  Patient presents with   Medical Management of Chronic Issues    6 month follow up. Discuss the need for 2nd Shingrix vaccine, Covid Booster, DTAP vaccine, and Dexa scan.    Discussed the use of AI scribe software for clinical note transcription with the patient, who gave verbal consent to proceed.  History of Present Illness   Michele Lowery is a 68 year old female who presents for a six-month follow-up visit.  She is experiencing  allergies with symptoms of sneezing, rhinorrhea, and coughing, which began after exposure to high pollen levels outdoors. She has not utilized her nasal rinse but has it available. She has been using over-the-counter allergy medications like Claritin or Zyrtec.  She reports a sensation of fluid in her ears for approximately six months, accompanied by crusty material on the external ear, which she attempts to remove. Vicks vapor rub has provided some relief, but the sensation persists, especially when supine. No history of ear infections or significant cerumen impaction has been noted in previous evaluations.  She experiences urinary urgency, particularly triggered by auditory stimuli such as running water, and uses a pad daily. She  recalls a past urethral dilation procedure for bladder infections, which resolved previous urinary issues.  Her gastroesophageal reflux disease is well-controlled with Pantoprazole, taken in the morning, with no nocturnal symptoms or eructation.  She has a history of osteopenia and is due for a bone density test. She maintains calcium and vitamin D supplementation.  Her hypertension is well-controlled with losartan and hydrochlorothiazide. She occasionally monitors her blood pressure at home.   Her depression is in remission with Prozac, and she is satisfied with the treatment.  She wants to lose weight, although her BMI is within a healthy range. She is actively engaged in physical activities, attending gym classes regularly, and has made dietary changes to reduce processed foods and increase fresh fruits and vegetables.       Review of Systems:  Review of Systems  Constitutional:  Negative for chills, fever and weight loss.  HENT:  Positive for ear discharge. Negative for ear pain and tinnitus.   Respiratory:  Negative for cough, sputum production and shortness of breath.   Cardiovascular:  Negative for chest pain, palpitations and leg swelling.  Gastrointestinal:  Negative for abdominal pain, constipation, diarrhea and heartburn.  Genitourinary:  Negative for dysuria, frequency and urgency.  Musculoskeletal:  Negative for back pain, falls, joint pain and myalgias.  Skin: Negative.   Neurological:  Negative for dizziness and headaches.  Psychiatric/Behavioral:  Negative for depression and memory loss. The patient does not have insomnia.     Past Medical History:  Diagnosis Date   Anxiety    Depression  HLD (hyperlipidemia)    HTN (hypertension)    Hx: UTI (urinary tract infection)    Internal hemorrhoids 11/13/08   Migraine headache 05/07/2011   h/o atypical migraines   Rosacea    Vertigo    Past Surgical History:  Procedure Laterality Date   COLONOSCOPY  11/13/2008    internal hemrrhoids   Social History:   reports that she quit smoking about 47 years ago. Her smoking use included cigarettes. She started smoking about 51 years ago. She has a 2 pack-year smoking history. She has never used smokeless tobacco. She reports current alcohol use of about 1.0 standard drink of alcohol per week. She reports that she does not use drugs.  Family History  Problem Relation Age of Onset   COPD Mother    Heart disease Father    Colon cancer Neg Hx    Colon polyps Neg Hx    Esophageal cancer Neg Hx    Stomach cancer Neg Hx    Rectal cancer Neg Hx     Medications: Patient's Medications  New Prescriptions   No medications on file  Previous Medications   ASPIRIN 81 MG TABLET    Take 81 mg by mouth daily.     ATORVASTATIN (LIPITOR) 40 MG TABLET    TAKE 1 TABLET BY MOUTH DAILY   B COMPLEX VITAMINS TABLET    Take 1 tablet by mouth daily.   CALCIUM CARBONATE (CALCIUM 600) 600 MG TABS TABLET    Take 1 tablet (600 mg total) by mouth 2 (two) times daily with a meal.   CHOLECALCIFEROL 50 MCG (2000 UT) CAPS    Take 1 capsule by mouth daily.   FAMOTIDINE (PEPCID) 10 MG TABLET    Take 10 mg by mouth at bedtime.   FLUOXETINE (PROZAC) 20 MG CAPSULE    TAKE 1 CAPSULE BY MOUTH DAILY   LOSARTAN-HYDROCHLOROTHIAZIDE (HYZAAR) 100-12.5 MG TABLET    TAKE 1 TABLET BY MOUTH DAILY   OMEGA-3 FATTY ACIDS (FISH OIL PO)    Take 2 capsules by mouth daily.   PANTOPRAZOLE (PROTONIX) 40 MG TABLET    TAKE 1 TABLET BY MOUTH DAILY   UNABLE TO FIND    Med Name: Cream prescribed by dermatology as needed  Modified Medications   No medications on file  Discontinued Medications   No medications on file    Physical Exam:  Vitals:   07/13/23 0818  BP: 110/70  Pulse: 67  Resp: 16  Temp: (!) 97.1 F (36.2 C)  SpO2: 90%  Weight: 170 lb 6.4 oz (77.3 kg)  Height: 5' 5.5" (1.664 m)   Body mass index is 27.92 kg/m. Wt Readings from Last 3 Encounters:  07/13/23 170 lb 6.4 oz (77.3 kg)   01/12/23 171 lb (77.6 kg)  07/06/22 170 lb 12.8 oz (77.5 kg)    Physical Exam Constitutional:      General: She is not in acute distress.    Appearance: She is well-developed. She is not diaphoretic.  HENT:     Head: Normocephalic and atraumatic.     Right Ear: Tympanic membrane, ear canal and external ear normal. No drainage or swelling. There is no impacted cerumen.     Left Ear: Tympanic membrane, ear canal and external ear normal. No drainage or swelling. There is no impacted cerumen.     Mouth/Throat:     Pharynx: No oropharyngeal exudate.  Eyes:     Conjunctiva/sclera: Conjunctivae normal.     Pupils: Pupils are equal, round, and  reactive to light.  Cardiovascular:     Rate and Rhythm: Normal rate and regular rhythm.     Heart sounds: Normal heart sounds.  Pulmonary:     Effort: Pulmonary effort is normal.     Breath sounds: Normal breath sounds.  Abdominal:     General: Bowel sounds are normal.     Palpations: Abdomen is soft.  Musculoskeletal:     Cervical back: Normal range of motion and neck supple.     Right lower leg: No edema.     Left lower leg: No edema.  Skin:    General: Skin is warm and dry.  Neurological:     Mental Status: She is alert.  Psychiatric:        Mood and Affect: Mood normal.     Labs reviewed: Basic Metabolic Panel: Recent Labs    01/12/23 0847  NA 142  K 4.6  CL 102  CO2 32  GLUCOSE 92  BUN 21  CREATININE 0.89  CALCIUM 9.7   Liver Function Tests: Recent Labs    01/12/23 0847  AST 15  ALT 21  BILITOT 0.7  PROT 6.5   No results for input(s): "LIPASE", "AMYLASE" in the last 8760 hours. No results for input(s): "AMMONIA" in the last 8760 hours. CBC: Recent Labs    01/12/23 0847  WBC 6.6  NEUTROABS 3,934  HGB 14.2  HCT 44.4  MCV 89.7  PLT 340   Lipid Panel: Recent Labs    01/12/23 0847  CHOL 171  HDL 56  LDLCALC 92  TRIG 136  CHOLHDL 3.1   TSH: No results for input(s): "TSH" in the last 8760  hours. A1C: Lab Results  Component Value Date   HGBA1C 5.4 06/13/2013     Assessment/Plan  Assessment and Plan    Allergic Rhinitis Symptoms likely due to high pollen exposure. Discussed nasal wash, antihistamines, and mask use. - Advise nasal wash or neti pot use at night. - Recommend Claritin or Zyrtec. - Suggest Flonase, one spray daily. - Advise mask use during outdoor activities.  Drainage to both ears Fluid sensation and crusting possibly related to allergies or thin earwax.  No redness, rash, pain or signs of infection. No immediate intervention required. - Monitor symptoms, consider ENT referral if worsens.  Urinary Urgency Strong urgency with auditory triggers. History of bladder infections and urethral dilation. Referral to urology discussed as well as pelvic floor OT - Refer to urology for evaluation.  Osteopenia Discussed supplementation  She is due for bone density test. - Order bone density test. - Continue calcium and vitamin D supplementation with weight bearing exercises  Gastroesophageal Reflux Disease (GERD) Indigestion improved with Protonix. Discussed calcium absorption and supplementation timing. - Continue Protonix.  Hypertension Blood pressure well-controlled with current medication. - Continue losartan hydrochlorothiazide.  Hyperlipidemia Cholesterol levels previously good. Blood work planned to reassess. - Continue atorvastatin. - Perform blood work today.  Depression Depression in remission with current treatment. Patient wishes to continue medication. - Continue Prozac.  General Health Maintenance Discussed weight management and healthy lifestyle. Emphasized nutrition and physical activity. - Encourage healthy eating habits. - Advise reducing sugar and processed foods, increasing fruits and vegetables. - Encourage continued physical activity.  Follow-up Scheduled follow-up appointments and tests as discussed. - Order blood work  today. - virtual annual wellness visit in May. - Follow up in six months for routine check-up.      Janene Harvey. Biagio [facility] [phone]

## 2023-07-13 NOTE — Patient Instructions (Signed)
 To take Loratadine or cetrizine (generic for Claritin or zyrtec) 10 mg by mouth daily for allergies.  -nettipot daily in PM -flonase 1 spray into both nares daily for inflammation

## 2023-07-14 LAB — COMPLETE METABOLIC PANEL WITHOUT GFR
AG Ratio: 2.3 (calc) (ref 1.0–2.5)
ALT: 21 U/L (ref 6–29)
AST: 20 U/L (ref 10–35)
Albumin: 4.6 g/dL (ref 3.6–5.1)
Alkaline phosphatase (APISO): 93 U/L (ref 37–153)
BUN: 20 mg/dL (ref 7–25)
CO2: 30 mmol/L (ref 20–32)
Calcium: 9.7 mg/dL (ref 8.6–10.4)
Chloride: 102 mmol/L (ref 98–110)
Creat: 0.96 mg/dL (ref 0.50–1.05)
Globulin: 2 g/dL (ref 1.9–3.7)
Glucose, Bld: 91 mg/dL (ref 65–99)
Potassium: 4.4 mmol/L (ref 3.5–5.3)
Sodium: 142 mmol/L (ref 135–146)
Total Bilirubin: 0.6 mg/dL (ref 0.2–1.2)
Total Protein: 6.6 g/dL (ref 6.1–8.1)

## 2023-07-14 LAB — CBC WITH DIFFERENTIAL/PLATELET
Absolute Lymphocytes: 1068 {cells}/uL (ref 850–3900)
Absolute Monocytes: 738 {cells}/uL (ref 200–950)
Basophils Absolute: 18 {cells}/uL (ref 0–200)
Basophils Relative: 0.3 %
Eosinophils Absolute: 67 {cells}/uL (ref 15–500)
Eosinophils Relative: 1.1 %
HCT: 45.8 % — ABNORMAL HIGH (ref 35.0–45.0)
Hemoglobin: 14.6 g/dL (ref 11.7–15.5)
MCH: 28.6 pg (ref 27.0–33.0)
MCHC: 31.9 g/dL — ABNORMAL LOW (ref 32.0–36.0)
MCV: 89.8 fL (ref 80.0–100.0)
MPV: 11.7 fL (ref 7.5–12.5)
Monocytes Relative: 12.1 %
Neutro Abs: 4209 {cells}/uL (ref 1500–7800)
Neutrophils Relative %: 69 %
Platelets: 302 10*3/uL (ref 140–400)
RBC: 5.1 10*6/uL (ref 3.80–5.10)
RDW: 12.5 % (ref 11.0–15.0)
Total Lymphocyte: 17.5 %
WBC: 6.1 10*3/uL (ref 3.8–10.8)

## 2023-07-14 LAB — LIPID PANEL
Cholesterol: 166 mg/dL (ref <200)
HDL: 52 mg/dL (ref >=50)
LDL Cholesterol (Calc): 93 mg/dL
Non-HDL Cholesterol (Calc): 114 mg/dL (ref <130)
Total CHOL/HDL Ratio: 3.2 (calc) (ref <5.0)
Triglycerides: 116 mg/dL (ref <150)

## 2023-07-16 ENCOUNTER — Encounter: Payer: Self-pay | Admitting: Nurse Practitioner

## 2023-08-14 ENCOUNTER — Encounter: Payer: Self-pay | Admitting: Nurse Practitioner

## 2023-08-14 ENCOUNTER — Ambulatory Visit: Payer: Medicare Other | Admitting: Nurse Practitioner

## 2023-08-14 DIAGNOSIS — Z0000 Encounter for general adult medical examination without abnormal findings: Secondary | ICD-10-CM

## 2023-08-14 NOTE — Patient Instructions (Signed)
  Ms. Loren , Thank you for taking time to come for your Medicare Wellness Visit. I appreciate your ongoing commitment to your health goals. Please review the following plan we discussed and let me know if I can assist you in the future.   To get SHINGLES VACCINE and TDAP at local pharmacy.    This is a list of the screening recommended for you and due dates:  Health Maintenance  Topic Date Due   Zoster (Shingles) Vaccine (1 of 2) 01/11/2004   COVID-19 Vaccine (4 - 2024-25 season) 12/10/2022   DTaP/Tdap/Td vaccine (3 - Td or Tdap) 12/12/2022   DEXA scan (bone density measurement)  07/12/2023   Flu Shot  11/09/2023   Medicare Annual Wellness Visit  08/13/2024   Mammogram  01/16/2025   Colon Cancer Screening  03/23/2029   Pneumonia Vaccine  Completed   Hepatitis C Screening  Completed   HPV Vaccine  Aged Out   Meningitis B Vaccine  Aged Out

## 2023-08-14 NOTE — Progress Notes (Signed)
 This service is provided via telemedicine  No vital signs collected/recorded due to the encounter was a telemedicine visit.   Location of patient (ex: home, work):  Home  Patient consents to a telephone visit:  yes  Location of the provider (ex: office, home):  Office [place].   Name of any referring provider:  na  Names of all persons participating in the telemedicine service and their role in the encounter:  Michele Lowery, patient, Almira Armour Joannah Gitlin, CMA, Calvin Caulk  Time spent on call:  7:09

## 2023-08-14 NOTE — Progress Notes (Signed)
 Subjective:   Michele Lowery is a 68 y.o. female who presents for Medicare Annual (Subsequent) preventive examination.  Visit Complete: Virtual I connected with  Rosanne Commodore on 08/14/23 by a video and audio enabled telemedicine application and verified that I am speaking with the correct person using two identifiers.  Patient Location: Home  Provider Location: Office/Clinic  I discussed the limitations of evaluation and management by telemedicine. The patient expressed understanding and agreed to proceed.  Vital Signs: Because this visit was a virtual/telehealth visit, some criteria may be missing or patient reported. Any vitals not documented were not able to be obtained and vitals that have been documented are patient reported.   Cardiac Risk Factors include: advanced age (>73men, >5 women)     Objective:    There were no vitals filed for this visit. There is no height or weight on file to calculate BMI.     08/14/2023   10:36 AM 07/13/2023    8:24 AM 01/12/2023    8:20 AM 08/10/2022   10:31 AM 07/06/2022    1:39 PM 08/04/2021    2:49 PM 07/08/2021    1:12 PM  Advanced Directives  Does Patient Have a Medical Advance Directive? Yes Yes Yes Yes Yes Yes Yes  Type of Estate agent of [place];Out of facility DNR (pink MOST or yellow form);Living will Healthcare Power of [place];Living will;Out of facility DNR (pink MOST or yellow form) Healthcare Power of [place];Living will Healthcare Power of [place];Out of facility DNR (pink MOST or yellow form);Living will Healthcare Power of [place];Living will;Out of facility DNR (pink MOST or yellow form) Healthcare Power of [place];Living will Healthcare Power of [place];Living will  Does patient want to make changes to medical advance directive?  No - Patient declined No - Patient declined No - Patient declined No - Patient declined No - Patient declined No - Patient declined  Copy of Healthcare Power of  Attorney in Chart? No - copy requested No - copy requested No - copy requested No - copy requested No - copy requested No - copy requested No - copy requested    Current Medications (verified) Outpatient Encounter Medications as of 08/14/2023  Medication Sig   aspirin 81 MG tablet Take 81 mg by mouth daily.     atorvastatin  (LIPITOR) 40 MG tablet TAKE 1 TABLET BY MOUTH DAILY   b complex vitamins tablet Take 1 tablet by mouth daily.   calcium  carbonate (CALCIUM  600) 600 MG TABS tablet Take 1 tablet (600 mg total) by mouth 2 (two) times daily with a meal.   Cholecalciferol 50 MCG (2000 UT) CAPS Take 1 capsule by mouth daily.   FLUoxetine  (PROZAC ) 20 MG capsule TAKE 1 CAPSULE BY MOUTH DAILY   losartan -hydrochlorothiazide (HYZAAR) 100-12.5 MG tablet TAKE 1 TABLET BY MOUTH DAILY   Omega-3 Fatty Acids (FISH OIL PO) Take 2 capsules by mouth daily.   pantoprazole  (PROTONIX ) 40 MG tablet TAKE 1 TABLET BY MOUTH DAILY   [DISCONTINUED] famotidine (PEPCID) 10 MG tablet Take 10 mg by mouth at bedtime.   [DISCONTINUED] UNABLE TO FIND Med Name: Cream prescribed by dermatology as needed (Patient not taking: Reported on 07/13/2023)   No facility-administered encounter medications on file as of 08/14/2023.    Allergies (verified) Amlodipine, Bee pollen, Desipramine, Doxycycline, Lisinopril, and Sulfonamide derivatives   History: Past Medical History:  Diagnosis Date   Anxiety    Depression    HLD (hyperlipidemia)    HTN (hypertension)    Hx:  UTI (urinary tract infection)    Internal hemorrhoids 11/13/08   Migraine headache 05/07/2011   h/o atypical migraines   Rosacea    Vertigo    Past Surgical History:  Procedure Laterality Date   COLONOSCOPY  11/13/2008   internal hemrrhoids   Family History  Problem Relation Age of Onset   COPD Mother    Heart disease Father    Colon cancer Neg Hx    Colon polyps Neg Hx    Esophageal cancer Neg Hx    Stomach cancer Neg Hx    Rectal cancer Neg Hx    Social  History   Socioeconomic History   Marital status: Married    Spouse name: Not on file   Number of children: 2   Years of education: Not on file   Highest education level: Bachelor's degree (e.g., BA, AB, BS)  Occupational History   Occupation: Printmaker  Tobacco Use   Smoking status: Former    Current packs/day: 0.00    Average packs/day: 0.5 packs/day for 4.0 years (2.0 ttl pk-yrs)    Types: Cigarettes    Start date: 07/09/1972    Quit date: 07/09/1976    Years since quitting: 47.1   Smokeless tobacco: Never  Vaping Use   Vaping status: Never Used  Substance and Sexual Activity   Alcohol use: Yes    Alcohol/week: 1.0 standard drink of alcohol    Types: 1 Standard drinks or equivalent per week    Comment: 1-2 per month    Drug use: No   Sexual activity: Not on file  Other Topics Concern   Not on file  Social History Narrative   caffeine drink daily   Water exercise   Gym about twice wk. On machines   Works full time.       Social Drivers of Corporate investment banker Strain: Low Risk  (07/12/2023)   Overall Financial Resource Strain (CARDIA)    Difficulty of Paying Living Expenses: Not hard at all  Food Insecurity: No Food Insecurity (07/12/2023)   Hunger Vital Sign    Worried About Running Out of Food in the Last Year: Never true    Ran Out of Food in the Last Year: Never true  Transportation Needs: No Transportation Needs (07/12/2023)   PRAPARE - Administrator, Civil Service (Medical): No    Lack of Transportation (Non-Medical): No  Physical Activity: Sufficiently Active (07/12/2023)   Exercise Vital Sign    Days of Exercise per Week: 5 days    Minutes of Exercise per Session: 30 min  Stress: No Stress Concern Present (07/12/2023)   [employer] of Occupational Health - Occupational Stress Questionnaire    Feeling of Stress : Only a little  Social Connections: Socially Integrated (07/12/2023)   Social Connection and Isolation Panel [NHANES]     Frequency of Communication with Friends and Family: More than three times a week    Frequency of Social Gatherings with Friends and Family: Twice a week    Attends Religious Services: More than 4 times per year    Active Member of [employer] or Organizations: Yes    Attends Engineer, structural: More than 4 times per year    Marital Status: Married    Tobacco Counseling Counseling given: Not Answered   Clinical Intake:  Pre-visit preparation completed: Yes  Pain : No/denies pain     BMI - recorded: 27 Nutritional Status: BMI 25 -29 Overweight Nutritional Risks: None  Diabetes: No  How often do you need to have someone help you when you read instructions, pamphlets, or other written materials from your doctor or pharmacy?: 1 - Never         Activities of Daily Living    08/14/2023   10:34 AM  In your present state of health, do you have any difficulty performing the following activities:  Hearing? 0  Vision? 0  Difficulty concentrating or making decisions? 0  Walking or climbing stairs? 0  Dressing or bathing? 0  Doing errands, shopping? 0  Preparing Food and eating ? N  Using the Toilet? N  In the past six months, have you accidently leaked urine? Y  Do you have problems with loss of bowel control? N  Managing your Medications? N  Managing your Finances? N  Housekeeping or managing your Housekeeping? N    Patient Care Team: Verma Gobble, NP as PCP - General (Geriatric Medicine)  Indicate any recent Medical Services you may have received from other than [facility] providers in the past year (date may be approximate).     Assessment:   This is a routine wellness examination for [place].  Hearing/Vision screen Vision Screening - Comments:: [facility] Last Exam: 2021   Goals Addressed             This Visit's Progress    Weight < 155 lb (70.308 kg)         Depression Screen    08/14/2023   10:34 AM 07/13/2023    8:24 AM 01/12/2023    8:19 AM  08/10/2022   10:33 AM 08/04/2021    2:46 PM 07/08/2021    1:11 PM 05/21/2019    8:16 AM  PHQ 2/9 Scores  PHQ - 2 Score 0 0 0 0 0 0 0  PHQ- 9 Score       2    Fall Risk    08/14/2023   10:34 AM 01/12/2023    8:19 AM 08/10/2022   10:33 AM 07/06/2022    1:39 PM 01/16/2022    8:19 AM  Fall Risk   Falls in the past year? 0 0 0 0 0  Number falls in past yr: 0 0 0 0 0  Injury with Fall? 0 0 1 0 0  Risk for fall due to : No Fall Risks No Fall Risks  No Fall Risks No Fall Risks  Follow up Falls evaluation completed Falls evaluation completed  Falls evaluation completed     MEDICARE RISK AT HOME: Medicare Risk at Home Any stairs in or around the home?: Yes If so, are there any without handrails?: No Home free of loose throw rugs in walkways, pet beds, electrical cords, etc?: Yes Adequate lighting in your home to reduce risk of falls?: Yes Life alert?: No Use of a cane, walker or w/c?: No Grab bars in the bathroom?: No Shower chair or bench in shower?: No Elevated toilet seat or a handicapped toilet?: No  TIMED UP AND GO:  Was the test performed?  No    Cognitive Function:        08/14/2023   10:37 AM 08/10/2022   10:33 AM 08/04/2021    2:49 PM  6CIT Screen  What Year? 0 points 0 points 0 points  What month? 0 points 0 points 0 points  What time? 0 points 0 points 0 points  Count back from 20 0 points 0 points 0 points  Months in reverse 0 points 0 points  0 points  Repeat phrase 0 points 0 points 2 points  Total Score 0 points 0 points 2 points    Immunizations Immunization History  Administered Date(s) Administered   Fluad Quad(high Dose 65+) 01/16/2022   Fluad Trivalent(High Dose 65+) 01/12/2023   Influenza Inj Mdck Quad Pf 02/10/2018   Influenza Split 03/14/2013   Influenza,inj,Quad PF,6+ Mos 12/21/2014, 01/27/2017   Influenza-Unspecified 02/14/2019   PFIZER(Purple Top)SARS-COV-2 Vaccination 05/16/2019, 06/11/2019, 01/29/2020   PNEUMOCOCCAL CONJUGATE-20 01/16/2022   Td  02/17/2004   Tdap 12/11/2012   Zoster, Live 01/21/2014    TDAP status: Due, Education has been provided regarding the importance of this vaccine. Advised may receive this vaccine at local pharmacy or Health Dept. Aware to provide a copy of the vaccination record if obtained from local pharmacy or Health Dept. Verbalized acceptance and understanding.  Flu Vaccine status: Up to date  Pneumococcal vaccine status: Up to date  Covid-19 vaccine status: Information provided on how to obtain vaccines.   Qualifies for Shingles Vaccine? Yes   Zostavax completed No   Shingrix Completed?: No.    Education has been provided regarding the importance of this vaccine. Patient has been advised to call insurance company to determine out of pocket expense if they have not yet received this vaccine. Advised may also receive vaccine at local pharmacy or Health Dept. Verbalized acceptance and understanding.  Screening Tests Health Maintenance  Topic Date Due   Zoster Vaccines- Shingrix (1 of 2) 01/11/2004   COVID-19 Vaccine (4 - 2024-25 season) 12/10/2022   DTaP/Tdap/Td (3 - Td or Tdap) 12/12/2022   DEXA SCAN  07/12/2023   INFLUENZA VACCINE  11/09/2023   Medicare Annual Wellness (AWV)  08/13/2024   MAMMOGRAM  01/16/2025   Colonoscopy  03/23/2029   Pneumonia Vaccine 29+ Years old  Completed   Hepatitis C Screening  Completed   HPV VACCINES  Aged Out   Meningococcal B Vaccine  Aged Out    Health Maintenance  Health Maintenance Due  Topic Date Due   Zoster Vaccines- Shingrix (1 of 2) 01/11/2004   COVID-19 Vaccine (4 - 2024-25 season) 12/10/2022   DTaP/Tdap/Td (3 - Td or Tdap) 12/12/2022   DEXA SCAN  07/12/2023    Colorectal cancer screening: Type of screening: Colonoscopy. Completed 2020. Repeat every 10 years  Mammogram status: Completed 01/2023+. Repeat every year  Bone density ordered  Lung Cancer Screening: (Low Dose CT Chest recommended if Age 28-80 years, 20 pack-year currently smoking  OR have quit w/in 15years.) does not qualify.   Lung Cancer Screening Referral: na  Additional Screening:  Hepatitis C Screening: does qualify; Completed   Vision Screening: Recommended annual ophthalmology exams for early detection of glaucoma and other disorders of the eye. Is the patient up to date with their annual eye exam?  Yes  Who is the provider or what is the name of the office in which the patient attends annual eye exams? [facility] If pt is not established with a provider, would they like to be referred to a provider to establish care? No .   Dental Screening: Recommended annual dental exams for proper oral hygiene   Community Resource Referral / Chronic Care Management: CRR required this visit?  No   CCM required this visit?  No     Plan:     I have personally reviewed and noted the following in the patient's chart:   Medical and social history Use of alcohol, tobacco or illicit drugs  Current medications and supplements including  opioid prescriptions. Patient is not currently taking opioid prescriptions. Functional ability and status Nutritional status Physical activity Advanced directives List of other physicians Hospitalizations, surgeries, and ER visits in previous 12 months Vitals Screenings to include cognitive, depression, and falls Referrals and appointments  In addition, I have reviewed and discussed with patient certain preventive protocols, quality metrics, and best practice recommendations. A written personalized care plan for preventive services as well as general preventive health recommendations were provided to patient.     Verma Gobble, NP   08/14/2023   After Visit Summary: (MyChart) Due to this being a telephonic visit, the after visit summary with patients personalized plan was offered to patient via MyChart

## 2023-10-24 ENCOUNTER — Other Ambulatory Visit: Payer: Self-pay | Admitting: Nurse Practitioner

## 2023-10-24 DIAGNOSIS — E785 Hyperlipidemia, unspecified: Secondary | ICD-10-CM

## 2023-10-24 DIAGNOSIS — I10 Essential (primary) hypertension: Secondary | ICD-10-CM

## 2023-12-08 ENCOUNTER — Other Ambulatory Visit: Payer: Self-pay | Admitting: Nurse Practitioner

## 2023-12-08 DIAGNOSIS — R12 Heartburn: Secondary | ICD-10-CM

## 2023-12-14 ENCOUNTER — Other Ambulatory Visit: Payer: Self-pay | Admitting: Urology

## 2024-01-14 NOTE — Patient Instructions (Signed)
 SURGICAL WAITING ROOM VISITATION  Patients having surgery or a procedure may have no more than 2 support people in the waiting area - these visitors may rotate.    Children under the age of 25 must have an adult with them who is not the patient.  Visitors with respiratory illnesses are discouraged from visiting and should remain at home.  If the patient needs to stay at the hospital during part of their recovery, the visitor guidelines for inpatient rooms apply. Pre-op nurse will coordinate an appropriate time for 1 support person to accompany patient in pre-op.  This support person may not rotate.    Please refer to the [facility] website for the visitor guidelines for Inpatients (after your surgery is over and you are in a regular room).       Your procedure is scheduled on:  01/24/2024    Report to [facility] Main Entrance    Report to admitting at   0830AM   Call this number if you have problems the morning of surgery [phone]   Do not eat food : or drink liquids After Midnight.                   If you have questions, please contact your surgeon's office.       Oral Hygiene is also important to reduce your risk of infection.                                    Remember - BRUSH YOUR TEETH THE MORNING OF SURGERY WITH YOUR REGULAR TOOTHPASTE  DENTURES WILL BE REMOVED PRIOR TO SURGERY PLEASE DO NOT APPLY Poly grip OR ADHESIVES!!!   Do NOT smoke after Midnight   Stop all vitamins and herbal supplements 7 days before surgery.   Take these medicines the morning of surgery with A SIP OF WATER:  prozac , protonix    DO NOT TAKE ANY ORAL DIABETIC MEDICATIONS DAY OF YOUR SURGERY  Bring CPAP mask and tubing day of surgery.                              You may not have any metal on your body including hair pins, jewelry, and body piercing             Do not wear make-up, lotions, powders, perfumes/cologne, or deodorant  Do not wear nail polish including gel  and S&S, artificial/acrylic nails, or any other type of covering on natural nails including finger and toenails. If you have artificial nails, gel coating, etc. that needs to be removed by a nail salon please have this removed prior to surgery or surgery may need to be canceled/ delayed if the surgeon/ anesthesia feels like they are unable to be safely monitored.   Do not shave  48 hours prior to surgery.               Men may shave face and neck.   Do not bring valuables to the hospital. [facility] IS NOT             RESPONSIBLE   FOR VALUABLES.   Contacts, glasses, dentures or bridgework may not be worn into surgery.   Bring small overnight bag day of surgery.   DO NOT BRING YOUR HOME MEDICATIONS TO THE HOSPITAL. PHARMACY WILL DISPENSE MEDICATIONS LISTED ON YOUR MEDICATION LIST TO  YOU DURING YOUR ADMISSION IN THE HOSPITAL!    Patients discharged on the day of surgery will not be allowed to drive home.  Someone NEEDS to stay with you for the first 24 hours after anesthesia.   Special Instructions: Bring a copy of your healthcare power of attorney and living will documents the day of surgery if you haven't scanned them before.              Please read over the following fact sheets you were given: IF YOU HAVE QUESTIONS ABOUT YOUR PRE-OP INSTRUCTIONS PLEASE CALL [phone].   If you received a COVID test during your pre-op visit  it is requested that you wear a mask when out in public, stay away from anyone that may not be feeling well and notify your surgeon if you develop symptoms. If you test positive for Covid or have been in contact with anyone that has tested positive in the last 10 days please notify you surgeon.    [place] - Preparing for Surgery Before surgery, you can play an important role.  Because skin is not sterile, your skin needs to be as free of germs as possible.  You can reduce the number of germs on your skin by washing with CHG (chlorahexidine gluconate) soap before  surgery.  CHG is an antiseptic cleaner which kills germs and bonds with the skin to continue killing germs even after washing. Please DO NOT use if you have an allergy to CHG or antibacterial soaps.  If your skin becomes reddened/irritated stop using the CHG and inform your nurse when you arrive at Short Stay. Do not shave (including legs and underarms) for at least 48 hours prior to the first CHG shower.  You may shave your face/neck.  Please follow these instructions carefully:  1.  Shower with CHG Soap the night before surgery ONLY (DO NOT USE THE SOAP THE MORNING OF SURGERY).  2.  If you choose to wash your hair, wash your hair first as usual with your normal  shampoo.  3.  After you shampoo, rinse your hair and body thoroughly to remove the shampoo.                             4.  Use CHG as you would any other liquid soap.  You can apply chg directly to the skin and wash.  Gently with a scrungie or clean washcloth.  5.  Apply the CHG Soap to your body ONLY FROM THE NECK DOWN.   Do   not use on face/ open                           Wound or open sores. Avoid contact with eyes, ears mouth and   genitals (private parts).                       Wash face,  Genitals (private parts) with your normal soap.             6.  Wash thoroughly, paying special attention to the area where your    surgery  will be performed.  7.  Thoroughly rinse your body with warm water from the neck down.  8.  DO NOT shower/wash with your normal soap after using and rinsing off the CHG Soap.  9.  Pat yourself dry with a clean towel.            10.  Wear clean pajamas.            11.  Place clean sheets on your bed the night of your first shower and do not  sleep with pets. Day of Surgery : Do not apply any CHG, lotions/deodorants the morning of surgery.  Please wear clean clothes to the hospital/surgery center.  FAILURE TO FOLLOW THESE INSTRUCTIONS MAY RESULT IN THE CANCELLATION OF YOUR SURGERY  PATIENT  SIGNATURE_________________________________  NURSE SIGNATURE__________________________________  ________________________________________________________________________

## 2024-01-14 NOTE — Progress Notes (Signed)
 Anesthesia Review:  PCP: harlene Caro PIETY LOV 08/12/23  Cardiologist :  PPM/ ICD: Device Orders: Rep Notified:  Chest x-ray : EKG : Echo :2011 Stress test: 2011 Cardiac Cath :   Activity level:  Sleep Study/ CPAP : Fasting Blood Sugar :      / Checks Blood Sugar -- times a day:    Blood Thinner/ Instructions /Last Dose: ASA / Instructions/ Last Dose :

## 2024-01-15 ENCOUNTER — Other Ambulatory Visit: Payer: Self-pay

## 2024-01-15 ENCOUNTER — Encounter (HOSPITAL_COMMUNITY): Payer: Self-pay

## 2024-01-15 ENCOUNTER — Encounter (HOSPITAL_COMMUNITY)
Admission: RE | Admit: 2024-01-15 | Discharge: 2024-01-15 | Disposition: A | Discharge status: Home / Self Care | Source: Ambulatory Visit | Attending: Urology | Admitting: Urology

## 2024-01-15 VITALS — BP 137/78 | HR 67 | Temp 99.5°F | Resp 16 | Ht 66.0 in | Wt 168.0 lb

## 2024-01-15 DIAGNOSIS — Z01818 Encounter for other preprocedural examination: Secondary | ICD-10-CM | POA: Diagnosis not present

## 2024-01-15 DIAGNOSIS — R001 Bradycardia, unspecified: Secondary | ICD-10-CM | POA: Diagnosis not present

## 2024-01-15 HISTORY — DX: Gastro-esophageal reflux disease without esophagitis: K21.9

## 2024-01-15 HISTORY — DX: Other complications of anesthesia, initial encounter: T88.59XA

## 2024-01-15 HISTORY — DX: Unspecified osteoarthritis, unspecified site: M19.90

## 2024-01-15 HISTORY — DX: Personal history of other diseases of the digestive system: Z87.19

## 2024-01-15 LAB — BASIC METABOLIC PANEL WITH GFR
Anion gap: 12 (ref 5–15)
BUN: 12 mg/dL (ref 8–23)
CO2: 27 mmol/L (ref 22–32)
Calcium: 10 mg/dL (ref 8.9–10.3)
Chloride: 102 mmol/L (ref 98–111)
Creatinine, Ser: 0.92 mg/dL (ref 0.44–1.00)
GFR, Estimated: >60 mL/min (ref >60)
Glucose, Bld: 90 mg/dL (ref 70–99)
Potassium: 4.1 mmol/L (ref 3.5–5.1)
Sodium: 141 mmol/L (ref 135–145)

## 2024-01-15 LAB — CBC
HCT: 43.4 % (ref 36.0–46.0)
Hemoglobin: 14 g/dL (ref 12.0–15.0)
MCH: 29.2 pg (ref 26.0–34.0)
MCHC: 32.3 g/dL (ref 30.0–36.0)
MCV: 90.6 fL (ref 80.0–100.0)
Platelets: 334 K/uL (ref 150–400)
RBC: 4.79 MIL/uL (ref 3.87–5.11)
RDW: 13 % (ref 11.5–15.5)
WBC: 7.3 K/uL (ref 4.0–10.5)
nRBC: 0 % (ref 0.0–0.2)

## 2024-01-23 MED ORDER — GENTAMICIN SULFATE 40 MG/ML IJ SOLN
5.0000 mg/kg | INTRAVENOUS | Status: AC
  Administered 2024-02-14: 381.2 mg via INTRAVENOUS
  Filled 2024-01-23 (×2): qty 9.5

## 2024-01-25 ENCOUNTER — Ambulatory Visit: Admitting: Nurse Practitioner

## 2024-01-25 ENCOUNTER — Encounter: Payer: Self-pay | Admitting: Nurse Practitioner

## 2024-01-25 VITALS — BP 124/86 | HR 56 | Temp 97.9°F | Ht 66.0 in | Wt 170.2 lb

## 2024-01-25 DIAGNOSIS — K219 Gastro-esophageal reflux disease without esophagitis: Secondary | ICD-10-CM | POA: Diagnosis not present

## 2024-01-25 DIAGNOSIS — N3946 Mixed incontinence: Secondary | ICD-10-CM | POA: Insufficient documentation

## 2024-01-25 DIAGNOSIS — I10 Essential (primary) hypertension: Secondary | ICD-10-CM | POA: Diagnosis not present

## 2024-01-25 DIAGNOSIS — J069 Acute upper respiratory infection, unspecified: Secondary | ICD-10-CM | POA: Diagnosis not present

## 2024-01-25 DIAGNOSIS — F325 Major depressive disorder, single episode, in full remission: Secondary | ICD-10-CM | POA: Insufficient documentation

## 2024-01-25 DIAGNOSIS — E782 Mixed hyperlipidemia: Secondary | ICD-10-CM | POA: Diagnosis not present

## 2024-01-25 DIAGNOSIS — J301 Allergic rhinitis due to pollen: Secondary | ICD-10-CM | POA: Insufficient documentation

## 2024-01-25 MED ORDER — BENZONATATE 100 MG PO CAPS: 100.0000 mg | ORAL_CAPSULE | Freq: Three times a day (TID) | ORAL | 0 refills | Status: AC | PRN

## 2024-01-25 NOTE — Assessment & Plan Note (Signed)
 Continues on lipitor 40 mg daily with fish oils

## 2024-01-25 NOTE — Assessment & Plan Note (Signed)
 Followed by urology plan to have ureteral sling surgery Continue lifestyle modifications as well.

## 2024-01-25 NOTE — Progress Notes (Signed)
 Careteam: Patient Care Team: Caro Harlene POUR, NP as PCP - General (Geriatric Medicine)  PLACE OF SERVICE:  [facility]  Advanced Directive information    Allergies  Allergen Reactions   Amlodipine Other (See Comments)    Pt does not recall   Bee Pollen    Desipramine Other (See Comments)    Pt does not recall    Doxycycline Nausea And Vomiting   Lisinopril Cough   Sulfonamide Derivatives Other (See Comments)    i can't remember the reaction    Chief Complaint  Patient presents with   Medicare Wellness    6 month routine follow up  Pt has had a cough and would like prescription that starts w a B.  Pt was scheduled for surgery yesterday and had to cancel Patient stated she had received the flu shot // patient hasn't considered the following vaccines : Shingles , TD , COVID     HPI:  Discussed the use of AI scribe software for clinical note transcription with the patient, who gave verbal consent to proceed.  History of Present Illness Michele Lowery is a 68 year old female who presents for a six-month follow-up and management of bladder leakage and recent upper respiratory symptoms.  She was scheduled for a bladder sling surgery but developed a fever on Tuesday night, which resolved by Thursday. She experienced symptoms of a head cold, including a sore throat and body aches, but tested negative for COVID-19. She has a persistent dry cough, which she describes as a 'bad combination' with her worsening bladder leakage. She has been using benzonatate  100 mg, which she had from 2017, and it has helped reduce her cough and bladder leakage. She typically takes one tablet at a time but has been taking it up to three times daily as needed.  Her bladder leakage has worsened recently. She prefers to proceed with the bladder sling surgery, which is now rescheduled for November 6th. She has completed preoperative blood work and an EKG.  She has been on Protonix  for about a  year for indigestion and acid reflux, which has improved her symptoms significantly. She no longer experiences burping at night but notes increased flatulence.   She reports occasional fluid sensation in her ears and clear wax production, which she finds bothersome. She experiences a crusty area near her ear that feels like 'somebody's sticking a needle in my ear' and has been using Neosporin and Vicks Vaporub for relief.  She experiences morning tightness in her Achilles tendons, which resolves after walking. She recalls a previous issue with her ankle that was treated with nitroglycerin  patches.  She recently celebrated her 68th birthday with a large gathering of friends at [facility], which she described as a 'gathering of friends'.    Review of Systems:  Review of Systems  Constitutional:  Negative for chills, fever and weight loss.  HENT:  Negative for tinnitus.   Respiratory:  Negative for cough, sputum production and shortness of breath.   Cardiovascular:  Negative for chest pain, palpitations and leg swelling.  Gastrointestinal:  Negative for abdominal pain, constipation, diarrhea and heartburn.  Genitourinary:  Negative for dysuria, frequency and urgency.  Musculoskeletal:  Negative for back pain, falls, joint pain and myalgias.  Skin: Negative.   Neurological:  Negative for dizziness and headaches.  Psychiatric/Behavioral:  Negative for depression and memory loss. The patient does not have insomnia.     Past Medical History:  Diagnosis Date   Anxiety  Arthritis    Complication of anesthesia    woke up during first colonoscopy   Depression    GERD (gastroesophageal reflux disease)    History of hiatal hernia    HLD (hyperlipidemia)    HTN (hypertension)    Hx: UTI (urinary tract infection)    Internal hemorrhoids 11/13/2008   Rosacea    Vertigo    Past Surgical History:  Procedure Laterality Date   COLONOSCOPY  11/13/2008   internal hemrrhoids   WISDOM  TOOTH EXTRACTION     Social History:   reports that she quit smoking about 47 years ago. Her smoking use included cigarettes. She started smoking about 51 years ago. She has a 2 pack-year smoking history. She has never used smokeless tobacco. She reports current alcohol use of about 1.0 standard drink of alcohol per week. She reports that she does not use drugs.  Family History  Problem Relation Age of Onset   COPD Mother    Heart disease Father    Colon cancer Neg Hx    Colon polyps Neg Hx    Esophageal cancer Neg Hx    Stomach cancer Neg Hx    Rectal cancer Neg Hx     Medications: Patient's Medications  New Prescriptions   BENZONATATE  (TESSALON ) 100 MG CAPSULE    Take 1 capsule (100 mg total) by mouth 3 (three) times daily as needed for cough.  Previous Medications   ASPIRIN EC 81 MG TABLET    Take 81 mg by mouth in the morning.   ATORVASTATIN  (LIPITOR) 40 MG TABLET    TAKE 1 TABLET BY MOUTH DAILY   B COMPLEX VITAMINS TABLET    Take 1 tablet by mouth in the morning.   CALCIUM  CARBONATE (CALCIUM  600) 600 MG TABS TABLET    Take 1 tablet (600 mg total) by mouth 2 (two) times daily with a meal.   CHOLECALCIFEROL (VITAMIN D3 PO)    Take 2,000 Units by mouth in the morning.   ESTRADIOL (ESTRACE) 0.1 MG/GM VAGINAL CREAM    Place 1 Applicatorful vaginally 2 (two) times a week.   FLUOXETINE  (PROZAC ) 20 MG CAPSULE    TAKE 1 CAPSULE BY MOUTH DAILY   LOSARTAN -HYDROCHLOROTHIAZIDE (HYZAAR) 100-12.5 MG TABLET    TAKE 1 TABLET BY MOUTH DAILY   OMEGA-3 FATTY ACIDS (FISH OIL PO)    Take 2,000 mg by mouth in the morning.   PANTOPRAZOLE  (PROTONIX ) 40 MG TABLET    TAKE 1 TABLET BY MOUTH DAILY  Modified Medications   No medications on file  Discontinued Medications   No medications on file    Physical Exam:  Vitals:   01/25/24 0835  BP: 124/86  Pulse: (!) 56  Temp: 97.9 F (36.6 C)  SpO2: 94%  Weight: 170 lb 3.2 oz (77.2 kg)  Height: 5' 6 (1.676 m)   Body mass index is 27.47 kg/m. Wt  Readings from Last 3 Encounters:  01/25/24 170 lb 3.2 oz (77.2 kg)  01/15/24 168 lb (76.2 kg)  07/13/23 170 lb 6.4 oz (77.3 kg)    Physical Exam Constitutional:      General: She is not in acute distress.    Appearance: She is well-developed. She is not diaphoretic.  HENT:     Head: Normocephalic and atraumatic.     Right Ear: Tympanic membrane and ear canal normal. There is no impacted cerumen.     Left Ear: Tympanic membrane and ear canal normal. There is no impacted cerumen.  Mouth/Throat:     Pharynx: No oropharyngeal exudate.  Eyes:     Conjunctiva/sclera: Conjunctivae normal.     Pupils: Pupils are equal, round, and reactive to light.  Cardiovascular:     Rate and Rhythm: Normal rate and regular rhythm.     Heart sounds: Normal heart sounds.  Pulmonary:     Effort: Pulmonary effort is normal.     Breath sounds: Normal breath sounds.  Abdominal:     General: Bowel sounds are normal.     Palpations: Abdomen is soft.  Musculoskeletal:     Cervical back: Normal range of motion and neck supple.     Right lower leg: No edema.     Left lower leg: No edema.  Skin:    General: Skin is warm and dry.  Neurological:     Mental Status: She is alert.  Psychiatric:        Mood and Affect: Mood normal.     Labs reviewed: Basic Metabolic Panel: Recent Labs    07/13/23 0857 01/15/24 0828  NA 142 141  K 4.4 4.1  CL 102 102  CO2 30 27  GLUCOSE 91 90  BUN 20 12  CREATININE 0.96 0.92  CALCIUM  9.7 10.0   Liver Function Tests: Recent Labs    07/13/23 0857  AST 20  ALT 21  BILITOT 0.6  PROT 6.6   No results for input(s): LIPASE, AMYLASE in the last 8760 hours. No results for input(s): AMMONIA in the last 8760 hours. CBC: Recent Labs    07/13/23 0857 01/15/24 0828  WBC 6.1 7.3  NEUTROABS 4,209  --   HGB 14.6 14.0  HCT 45.8* 43.4  MCV 89.8 90.6  PLT 302 334   Lipid Panel: Recent Labs    07/13/23 0857  CHOL 166  HDL 52  LDLCALC 93  TRIG 116   CHOLHDL 3.2   TSH: No results for input(s): TSH in the last 8760 hours. A1C: Lab Results  Component Value Date   HGBA1C 5.4 06/13/2013     Assessment/Plan  Viral upper respiratory tract infection -   symptoms have significantly improved, continues with mild cough, request rx  Benzonatate ; Take 1 capsule (100 mg total) by mouth 3 (three) times daily as needed for cough.  Dispense: 20 capsule; Refill: 0  Mixed stress and urge urinary incontinence Assessment & Plan: Followed by urology plan to have ureteral sling surgery Continue lifestyle modifications as well.    Essential hypertension Assessment & Plan: Blood pressure well controlled, goal bp <140/90 Continue current medications and dietary modifications follow metabolic panel   Gastroesophageal reflux disease without esophagitis Assessment & Plan: Well controlled on protonix , can trail every other day to see if she can stop medication Dietary modifications encouraged as well.    Mixed hyperlipidemia Assessment & Plan: Continues on lipitor 40 mg daily with fish oils   Depression, major, in remission Assessment & Plan: Well controlled on prozac    Return in about 6 months (around 07/25/2024) for routine follow up, labs at time of visit.  Seymore Brodowski K. Caro BODILY [facility] [phone]

## 2024-01-25 NOTE — Assessment & Plan Note (Signed)
Well controlled on prozac. 

## 2024-01-25 NOTE — Assessment & Plan Note (Signed)
 Blood pressure well controlled, goal bp <140/90 Continue current medications and dietary modifications follow metabolic panel

## 2024-01-25 NOTE — Assessment & Plan Note (Signed)
 Well controlled on protonix , can trail every other day to see if she can stop medication Dietary modifications encouraged as well.

## 2024-01-27 ENCOUNTER — Other Ambulatory Visit: Payer: Self-pay | Admitting: Nurse Practitioner

## 2024-01-27 DIAGNOSIS — F325 Major depressive disorder, single episode, in full remission: Secondary | ICD-10-CM

## 2024-02-11 NOTE — Progress Notes (Signed)
 Patient phoned to give updated information on surgery.  Surgery had to be rescheduled due to respiratory illness.  All symptoms have resolved per patient.    Date of Surgery - 02-14-24  Arrival Time - 10:15 and check in at admitting.    NPO Status - patient reminded to not eat solid food or drink liquids after midnight.    Medications morning of surgery - Prozac , Protonix , Atorvastatin   No change in medical history, allergies per patient.  Transportation home - [facility] [phone]  All questions answered and patient stated understanding

## 2024-02-13 NOTE — H&P (Signed)
 68 year old female referred for urinary urgency that is triggered by auditory stimuli such as running water she currently uses a pad daily she does have a history of urethral dilation for bladder infections in the past. Which resolved her urinary issues.   PMH: Anxiety, depression, HLD, HTN, recurrent UTIs, hemorrhoids, migraines, No Cards or Pulm hx.  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   MUI:  09/24/2023: PVR 0, BP 123/83, having MUI. No issues after urethral dilation. Patient had 2 children vaginal deliveries, has had incontence for 6 years. No Stool incontinence. NO constipation issues. No GH, no issues with UTIs. UUI associated with auditory stimuli. Severe SUI. Pt was content after the urethra dilation when she was young. Q-tip test was positive for 3 to 4 cm.  12/14/23: UDS demonstrates significantly stress urinary incontinence, PVR 87 discussed sling. Patient no longer has the surgery. Patient is interested in the sling. Patient wants to pursue sling after discussion today.  02/14/24: proceed with sling placement     ALLERGIES: amLODIPine Desipramine Doxycycline Lisinopril Sulfa    MEDICATIONS: Aspirin 81  Atorvastatin  Calcium  40 MG Tablet  B Complex  Calcium  Carbonate  Fish Oil  FLUoxetine  HCl 20 MG Capsule  Losartan  Potassium-HCTZ 100-25 MG Tablet  Pantoprazole  Sodium 40 MG Tablet Delayed Release     GU PSH: Complex cystometrogram, w/ void pressure and urethral pressure profile studies, any technique - 11/05/2023 Complex Uroflow - 11/05/2023 Emg surf Electrd - 11/05/2023 Inject For cystogram - 11/05/2023 Intrabd voidng Press - 11/05/2023     NON-GU PSH: No Non-GU PSH    GU PMH: Mixed incontinence - 11/05/2023, - 09/24/2023 Urinary Urgency - 09/24/2023    NON-GU PMH: Anxiety Depression Encounter for general adult medical examination without abnormal findings, Encounter for preventive health  examination GERD Hypercholesterolemia Hypertension    FAMILY HISTORY: Heart Attack - Runs in Family   SOCIAL HISTORY: Marital Status: Married Preferred Language: English; Ethnicity: Not Hispanic Or Latino; Race: White Current Smoking Status: Patient does not smoke anymore. Has not smoked since 09/09/1983.   Tobacco Use Assessment Completed: Used Tobacco in last 30 days? Does not use smokeless tobacco. Does drink.  Does not use drugs. Drinks 1 caffeinated drink per day. Has not had a blood transfusion.    REVIEW OF SYSTEMS:    GU Review Female:   Patient denies frequent urination, hard to postpone urination, burning /pain with urination, get up at night to urinate, leakage of urine, stream starts and stops, trouble starting your stream, have to strain to urinate, and being pregnant.  Gastrointestinal (Upper):   Patient denies nausea, vomiting, and indigestion/ heartburn.  Gastrointestinal (Lower):   Patient denies diarrhea and constipation.  Constitutional:   Patient denies fever, night sweats, weight loss, and fatigue.  Skin:   Patient denies skin rash/ lesion and itching.  Eyes:   Patient denies blurred vision and double vision.  Ears/ Nose/ Throat:   Patient denies sore throat and sinus problems.  Hematologic/Lymphatic:   Patient denies easy bruising and swollen glands.  Cardiovascular:   Patient denies leg swelling and chest pains.  Respiratory:   Patient denies cough and shortness of breath.  Endocrine:   Patient denies excessive thirst.  Musculoskeletal:   Patient denies back pain and joint pain.  Neurological:   Patient denies headaches and dizziness.  Psychologic:   Patient denies depression and anxiety.   VITAL SIGNS: None   MULTI-SYSTEM PHYSICAL EXAMINATION:    Constitutional: Well-nourished. No physical  deformities. Normally developed. Good grooming.  Respiratory: No labored breathing, no use of accessory muscles.   Cardiovascular: Normal temperature, normal extremity  pulses, no swelling, no varicosities.  Gastrointestinal: No scars on the abdomen. Abdomen soft.     Complexity of Data:  Source Of History:  Patient  Records Review:   Previous Patient Records  Urodynamics Review:   Review Urodynamics Tests   PROCEDURES:          Visit Complexity - G2211          Urinalysis - 81003 Dipstick Dipstick Cont'd  Color: Yellow Bilirubin: Neg  Appearance: Clear Ketones: Neg  Specific Gravity: 1.015 Blood: Neg  pH: 7.5 Protein: Trace  Glucose: Neg Urobilinogen: 0.2    Nitrites: Neg    Leukocyte Esterase: Neg    Notes:      ASSESSMENT:      ICD-10 Details  1 GU:   Mixed incontinence - N39.46 Chronic, Stable  2   Urinary Urgency - R39.15 Chronic, Stable  3   Stress Incontinence - N39.3 Chronic, Worsening   PLAN:            Medications New Meds: Estradiol 0.1 MG/GM Cream 1 gram Topical As Directed Please place vaginally once a day for 1 week then 2 times a week after that  #30  1 Refill(s)  Pharmacy Name:  [id number]  Address:  [address]   [place], [place] [address]  Phone:  [phone]  Fax:  [phone]            Orders Labs Urine Culture          Document Letter(s):  Created for Patient: Clinical Summary         Notes:   Stress urinary continence: Patient has UDS that confirm stress urinary incontinence. We initially plan to do the pessary patient's which was from friends and felt like this was not a good option. Would like to pursue urethral sling.   Which is respites alternatives to urethral sling including bleeding infection damage chronic structures possible urethral or vaginal mesh erosion possible worsening of urinary urge incontinence, possible urinary retention patient voiced understanding consent was obtained.   Urine Cx was negative Plan to proceed with mid urethral sling today

## 2024-02-14 ENCOUNTER — Encounter (HOSPITAL_COMMUNITY)
Admission: RE | Disposition: A | Discharge status: Home / Self Care | Payer: Self-pay | Source: Home / Self Care | Attending: Urology

## 2024-02-14 ENCOUNTER — Ambulatory Visit (HOSPITAL_COMMUNITY): Payer: Self-pay | Admitting: Physician Assistant

## 2024-02-14 ENCOUNTER — Ambulatory Visit (HOSPITAL_COMMUNITY): Admitting: Certified Registered Nurse Anesthetist

## 2024-02-14 ENCOUNTER — Ambulatory Visit (HOSPITAL_COMMUNITY)
Admission: RE | Admit: 2024-02-14 | Discharge: 2024-02-14 | Disposition: A | Discharge status: Home / Self Care | Attending: Urology | Admitting: Urology

## 2024-02-14 ENCOUNTER — Encounter (HOSPITAL_COMMUNITY): Payer: Self-pay | Admitting: Urology

## 2024-02-14 DIAGNOSIS — I10 Essential (primary) hypertension: Secondary | ICD-10-CM | POA: Diagnosis not present

## 2024-02-14 DIAGNOSIS — Z01818 Encounter for other preprocedural examination: Secondary | ICD-10-CM

## 2024-02-14 DIAGNOSIS — Z87891 Personal history of nicotine dependence: Secondary | ICD-10-CM | POA: Diagnosis not present

## 2024-02-14 DIAGNOSIS — N3946 Mixed incontinence: Secondary | ICD-10-CM | POA: Insufficient documentation

## 2024-02-14 DIAGNOSIS — N393 Stress incontinence (female) (male): Secondary | ICD-10-CM | POA: Diagnosis present

## 2024-02-14 DIAGNOSIS — Z87440 Personal history of urinary (tract) infections: Secondary | ICD-10-CM | POA: Diagnosis not present

## 2024-02-14 DIAGNOSIS — F32A Depression, unspecified: Secondary | ICD-10-CM | POA: Diagnosis not present

## 2024-02-14 DIAGNOSIS — R3915 Urgency of urination: Secondary | ICD-10-CM | POA: Diagnosis present

## 2024-02-14 DIAGNOSIS — K219 Gastro-esophageal reflux disease without esophagitis: Secondary | ICD-10-CM | POA: Insufficient documentation

## 2024-02-14 DIAGNOSIS — F419 Anxiety disorder, unspecified: Secondary | ICD-10-CM | POA: Diagnosis not present

## 2024-02-14 HISTORY — PX: BLADDER SUSPENSION: SHX72

## 2024-02-14 HISTORY — PX: CYSTOSCOPY: SHX5120

## 2024-02-14 SURGERY — CREATION, URETHRAL SLING, RETROPUBIC APPROACH, USING POLYPROPYLENE TAPE
Anesthesia: General | Site: Vagina

## 2024-02-14 MED ORDER — BUPIVACAINE HCL (PF) 0.5 % IJ SOLN
INTRAMUSCULAR | Status: DC | PRN
  Administered 2024-02-14: 1.5 mL

## 2024-02-14 MED ORDER — LIDOCAINE HCL (PF) 2 % IJ SOLN
INTRAMUSCULAR | Status: AC
  Filled 2024-02-14: qty 5

## 2024-02-14 MED ORDER — BUPIVACAINE HCL (PF) 0.5 % IJ SOLN
INTRAMUSCULAR | Status: AC
  Filled 2024-02-14: qty 30

## 2024-02-14 MED ORDER — PROPOFOL 10 MG/ML IV BOLUS
INTRAVENOUS | Status: DC | PRN
  Administered 2024-02-14: 150 mg via INTRAVENOUS

## 2024-02-14 MED ORDER — ONDANSETRON HCL 4 MG/2ML IJ SOLN
INTRAMUSCULAR | Status: AC
  Filled 2024-02-14: qty 2

## 2024-02-14 MED ORDER — EPHEDRINE SULFATE-NACL 50-0.9 MG/10ML-% IV SOSY
PREFILLED_SYRINGE | INTRAVENOUS | Status: DC | PRN
  Administered 2024-02-14: 5 mg via INTRAVENOUS
  Administered 2024-02-14: 10 mg via INTRAVENOUS
  Administered 2024-02-14 (×2): 5 mg via INTRAVENOUS

## 2024-02-14 MED ORDER — PHENYLEPHRINE HCL (PRESSORS) 10 MG/ML IV SOLN
INTRAVENOUS | Status: AC
  Filled 2024-02-14: qty 1

## 2024-02-14 MED ORDER — ESTRADIOL 0.01 % VA CREA
TOPICAL_CREAM | VAGINAL | Status: DC | PRN
  Administered 2024-02-14: 1 via VAGINAL

## 2024-02-14 MED ORDER — ORAL CARE MOUTH RINSE: 15.0000 mL | Freq: Once | OROMUCOSAL | Status: AC

## 2024-02-14 MED ORDER — ESTRADIOL 0.01 % VA CREA
TOPICAL_CREAM | VAGINAL | Status: AC
  Filled 2024-02-14: qty 42.5

## 2024-02-14 MED ORDER — CHLORHEXIDINE GLUCONATE 0.12 % MT SOLN
15.0000 mL | Freq: Once | OROMUCOSAL | Status: AC
  Administered 2024-02-14: 15 mL via OROMUCOSAL

## 2024-02-14 MED ORDER — METHOCARBAMOL 750 MG PO TABS: 750.0000 mg | ORAL_TABLET | Freq: Four times a day (QID) | ORAL | 0 refills | Status: AC

## 2024-02-14 MED ORDER — HYDROCODONE-ACETAMINOPHEN 5-325 MG PO TABS: 1.0000 | ORAL_TABLET | Freq: Four times a day (QID) | ORAL | 0 refills | Status: AC | PRN

## 2024-02-14 MED ORDER — ACETAMINOPHEN 10 MG/ML IV SOLN: 1000.0000 mg | Freq: Once | INTRAVENOUS | Status: DC | PRN

## 2024-02-14 MED ORDER — PHENYLEPHRINE HCL-NACL 20-0.9 MG/250ML-% IV SOLN
INTRAVENOUS | Status: DC | PRN
  Administered 2024-02-14: 10 ug/min via INTRAVENOUS

## 2024-02-14 MED ORDER — LIDOCAINE-EPINEPHRINE 1 %-1:100000 IJ SOLN
INTRAMUSCULAR | Status: DC | PRN
  Administered 2024-02-14: 12 mL

## 2024-02-14 MED ORDER — ONDANSETRON HCL 4 MG/2ML IJ SOLN
INTRAMUSCULAR | Status: DC | PRN
  Administered 2024-02-14: 4 mg via INTRAVENOUS

## 2024-02-14 MED ORDER — SODIUM CHLORIDE 0.9 % IV SOLN
1.0000 g | INTRAVENOUS | Status: AC
  Administered 2024-02-14: 1 g via INTRAVENOUS
  Filled 2024-02-14: qty 1000

## 2024-02-14 MED ORDER — LIDOCAINE HCL (PF) 1 % IJ SOLN
INTRAMUSCULAR | Status: DC | PRN
  Administered 2024-02-14: 1.5 mL

## 2024-02-14 MED ORDER — PROPOFOL 10 MG/ML IV BOLUS
INTRAVENOUS | Status: AC
  Filled 2024-02-14: qty 20

## 2024-02-14 MED ORDER — FENTANYL CITRATE (PF) 250 MCG/5ML IJ SOLN
INTRAMUSCULAR | Status: DC | PRN
  Administered 2024-02-14: 100 ug via INTRAVENOUS

## 2024-02-14 MED ORDER — LIDOCAINE-EPINEPHRINE 1 %-1:100000 IJ SOLN
INTRAMUSCULAR | Status: AC
Start: 2024-02-14 — End: 2024-02-14
  Filled 2024-02-14: qty 1

## 2024-02-14 MED ORDER — HYOSCYAMINE SULFATE 0.125 MG PO TBDP: 0.1250 mg | ORAL_TABLET | Freq: Four times a day (QID) | ORAL | 0 refills | Status: AC | PRN

## 2024-02-14 MED ORDER — DEXAMETHASONE SOD PHOSPHATE PF 10 MG/ML IJ SOLN
INTRAMUSCULAR | Status: DC | PRN
  Administered 2024-02-14: 10 mg via INTRAVENOUS

## 2024-02-14 MED ORDER — FENTANYL CITRATE (PF) 50 MCG/ML IJ SOSY: 25.0000 ug | PREFILLED_SYRINGE | INTRAMUSCULAR | Status: DC | PRN

## 2024-02-14 MED ORDER — LIDOCAINE 2% (20 MG/ML) 5 ML SYRINGE
INTRAMUSCULAR | Status: DC | PRN
  Administered 2024-02-14: 100 mg via INTRAVENOUS

## 2024-02-14 MED ORDER — FENTANYL CITRATE (PF) 250 MCG/5ML IJ SOLN
INTRAMUSCULAR | Status: AC
  Filled 2024-02-14: qty 5

## 2024-02-14 MED ORDER — MIDAZOLAM HCL 2 MG/2ML IJ SOLN
INTRAMUSCULAR | Status: AC
  Filled 2024-02-14: qty 2

## 2024-02-14 MED ORDER — LIDOCAINE HCL (PF) 1 % IJ SOLN
INTRAMUSCULAR | Status: AC
  Filled 2024-02-14: qty 30

## 2024-02-14 MED ORDER — DROPERIDOL 2.5 MG/ML IJ SOLN
0.6250 mg | Freq: Once | INTRAMUSCULAR | Status: DC | PRN
Start: 2024-02-14 — End: 2024-02-14

## 2024-02-14 MED ORDER — 0.9 % SODIUM CHLORIDE (POUR BTL) OPTIME
TOPICAL | Status: DC | PRN
  Administered 2024-02-14: 1000 mL

## 2024-02-14 MED ORDER — MIDAZOLAM HCL (PF) 2 MG/2ML IJ SOLN
INTRAMUSCULAR | Status: DC | PRN
  Administered 2024-02-14 (×2): 1 mg via INTRAVENOUS

## 2024-02-14 MED ORDER — STERILE WATER FOR IRRIGATION IR SOLN
Status: DC | PRN
  Administered 2024-02-14: 500 mL

## 2024-02-14 MED ORDER — ROCURONIUM BROMIDE 10 MG/ML (PF) SYRINGE
PREFILLED_SYRINGE | INTRAVENOUS | Status: AC
  Filled 2024-02-14: qty 10

## 2024-02-14 MED ORDER — LACTATED RINGERS IV SOLN: INTRAVENOUS | Status: DC

## 2024-02-14 SURGICAL SUPPLY — 48 items
BAG COUNTER SPONGE SURGICOUNT (BAG) IMPLANT
BAG URINE DRAIN 2000ML AR STRL (UROLOGICAL SUPPLIES) ×2 IMPLANT
BLADE SURG 15 STRL LF DISP TIS (BLADE) ×2 IMPLANT
BNDG GAUZE DERMACEA FLUFF 4 (GAUZE/BANDAGES/DRESSINGS) ×2 IMPLANT
BRIEF MESH DISP LRG (UNDERPADS AND DIAPERS) ×2 IMPLANT
CATH FOLEY 2WAY SLVR 5CC 14FR (CATHETERS) ×2 IMPLANT
CATH FOLEY 2WAY SLVR 5CC 16FR (CATHETERS) IMPLANT
CATH TIEMANN FOLEY 18FR 5CC (CATHETERS) ×2 IMPLANT
CHLORAPREP W/TINT 26 (MISCELLANEOUS) ×4 IMPLANT
COVER BACK TABLE 60X90IN (DRAPES) ×2 IMPLANT
COVER MAYO STAND STRL (DRAPES) ×2 IMPLANT
COVER SURGICAL LIGHT HANDLE (MISCELLANEOUS) ×2 IMPLANT
DERMABOND ADVANCED .7 DNX12 (GAUZE/BANDAGES/DRESSINGS) ×4 IMPLANT
DRAPE UNDERBUTTOCKS STRL (DISPOSABLE) ×2 IMPLANT
DRSG TELFA 3X8 NADH STRL (GAUZE/BANDAGES/DRESSINGS) ×2 IMPLANT
ELECT PENCIL ROCKER SW 15FT (MISCELLANEOUS) ×2 IMPLANT
GAUZE 4X4 16PLY ~~LOC~~+RFID DBL (SPONGE) ×4 IMPLANT
GLOVE BIOGEL M 7.0 STRL (GLOVE) ×2 IMPLANT
GLOVE BIOGEL PI IND STRL 7.0 (GLOVE) ×2 IMPLANT
GOWN STRL REUS W/ TWL XL LVL3 (GOWN DISPOSABLE) ×2 IMPLANT
HOLDER FOLEY CATH W/STRAP (MISCELLANEOUS) ×2 IMPLANT
KIT BASIN OR (CUSTOM PROCEDURE TRAY) ×2 IMPLANT
KIT TURNOVER KIT A (KITS) ×2 IMPLANT
LUBRICANT JELLY K Y 4OZ (MISCELLANEOUS) ×2 IMPLANT
NDL HYPO 22X1.5 SAFETY MO (MISCELLANEOUS) ×2 IMPLANT
NDL SPNL 22GX3.5 QUINCKE BK (NEEDLE) ×2 IMPLANT
NEEDLE HYPO 22X1.5 SAFETY MO (MISCELLANEOUS) ×2 IMPLANT
NEEDLE SPNL 22GX3.5 QUINCKE BK (NEEDLE) ×2 IMPLANT
PACKING VAGINAL (PACKING) IMPLANT
PLUG CATH AND CAP STRL 200 (CATHETERS) ×2 IMPLANT
PROTECTOR NERVE ULNAR (MISCELLANEOUS) ×2 IMPLANT
SET IRRIG Y TYPE TUR BLADDER L (SET/KITS/TRAYS/PACK) ×2 IMPLANT
SHEET LAVH (DRAPES) ×2 IMPLANT
SLING LYNX SUPRAPUBIC (Sling) IMPLANT
SPIKE FLUID TRANSFER (MISCELLANEOUS) IMPLANT
STAPLER SKIN PROX 35W (STAPLE) ×2 IMPLANT
SUT MNCRL AB 4-0 PS2 18 (SUTURE) ×2 IMPLANT
SUT SILK 2 0 30 PSL (SUTURE) IMPLANT
SUT VIC AB 2-0 SH 27X BRD (SUTURE) IMPLANT
SUT VIC AB 3-0 SH 27XBRD (SUTURE) ×8 IMPLANT
SUT VIC AB 4-0 PS2 27 (SUTURE) IMPLANT
SUT VIC AB 4-0 RB1 27XBRD (SUTURE) IMPLANT
SYR 10ML LL (SYRINGE) ×2 IMPLANT
SYR 20ML LL LF (SYRINGE) ×2 IMPLANT
SYR BULB IRRIG 60ML STRL (SYRINGE) ×2 IMPLANT
TOWEL OR 17X26 10 PK STRL BLUE (TOWEL DISPOSABLE) ×4 IMPLANT
TUBING CONNECTING 10 (TUBING) ×2 IMPLANT
YANKAUER SUCT BULB TIP 10FT TU (MISCELLANEOUS) ×2 IMPLANT

## 2024-02-14 NOTE — Op Note (Signed)
 PATIENT:  Michele Lowery  PRE-OPERATIVE DIAGNOSIS: Stress urinary incontinence   POST-OPERATIVE DIAGNOSIS:  Same  PROCEDURE: Retropubic mid urethral sling placement and cystoscopy.   SURGEON:  Surgeon: Steffan Pea   INDICATION: Confirmed Stress urinary incontinence   ANESTHESIA:  General  EBL:  less than 50 mL  DRAINS: 16 fr foley   Packing: Vaginal packing soaked in estrace removed in the PACU  SPECIMEN:  None  DISPOSITION OF SPECIMEN:  N/A  Complications: Trocar bladder injury R anterior bladder   DESCRIPTION OF PROCEDURE: After informed consent the patient was brought to the major OR and placed on the table. She was administered general anesthesia and then moved to the dorsal lithotomy position. Her lower abdomen and genitalia including vagina was sterilely prepped and draped. An official timeout was then performed.  A 16 French Foley catheter was placed in the bladder and the bladder was drained. A weighted speculum was then placed in the vagina and I injected 14 cc of Lidocaine with epinephrine in the suburethral region. After allowing adequate time for epinephrine effect a midline incision was made over the urethra which was noted by palpation of the Foley catheter tubing and balloon. I then dissected beneath the vaginal mucosa laterally around the urethra and was then able to insert my index finger and palpate the undersurface of the symphysis pubis. The wound was irrigated with antibiotic solution.  Attention was turned to the suprapubic region and I injected Marcaine with epinephrine at 2 locations approximately at the superior edge of the pubic bone and 3 finger widths apart the medin at the pubic symphysis. A stab incision was then made in these locations. I then made sure the bladder was completely empty by draining it through the Foley catheter. The suprapubic trocar was then passed through the skin incision, down behind the symphysis pubis and directed  slightly laterally until it was in the area of the vagina what was redirected medially and was palpated and directed out through the vaginal incision at the mid urethral level. This was performed first on the left and then the right side.  The Foley catheter was removed and cystoscopy was performed. The 22 French cystoscope with 30 lens was inserted in the bladder and the bladder was fully and systematically inspected.   There appear to be a small through and through trocal injury to the bladder on the Right side. The Trocar was removed the bladder was emptied again and the trocar was replaced in the exact same fashion as before but staying more lateral. The cystoscope was then reintroduced into the bladder and pan cystoscopy was performed. The previous injury was seen but no trocar or visible injury was seen to the bladder or urethra. No tumor stones or inflammatory lesions were seen. Ureteral orifices were of normal configuration and position with good efflux bilaterally. The previous trocar injury was hemostatic.. I therefore drained the bladder, removed the cystoscope and reinserted the Foley catheter.  The sling material was then affixed to the distal aspect of each of the trochars and withdrawn back through the suprapubic incisions. I then repeated the cystoscopy again noting no evidence of sling material or foreign body within the bladder. I therefore divided the sling sheath. The sheath was grasped with a hemostat and the sling was then withdrawn into a position at the mid urethra. I placed forceps beneath the urethra and then removed the sheathing first on the left side and then on the right-hand side. This was performed in  such a manner that there was no tension noted on the sling material. The excess sling material was then excised at the skin level and the skin incisions were closed with Dermabond after irrigation with antibiotic solution.  Attention was then redirected to the vagina and the  incision was irrigated copiously with antibiotic solution. I then closed the incision with running 2-0 Vicryl suture. The Foley catheter was left indwelling. The patient tolerated the procedure well and there were no intraoperative complications.  PLAN OF CARE: Discharge to home after PACU. PT will DC with catheter and have catheter removed next week in clinic.   PATIENT DISPOSITION:  PACU - hemodynamically stable.

## 2024-02-14 NOTE — Anesthesia Postprocedure Evaluation (Signed)
 Anesthesia Post Note  Patient: Michele Lowery  Procedure(s) Performed: CREATION, URETHRAL SLING, RETROPUBIC APPROACH, USING POLYPROPYLENE TAPE (Vagina ) CYSTOSCOPY (Urethra)     Patient location during evaluation: PACU Anesthesia Type: General Level of consciousness: awake and alert Pain management: pain level controlled Vital Signs Assessment: post-procedure vital signs reviewed and stable Respiratory status: spontaneous breathing, nonlabored ventilation, respiratory function stable and patient connected to nasal cannula oxygen Cardiovascular status: blood pressure returned to baseline and stable Postop Assessment: no apparent nausea or vomiting Anesthetic complications: no   No notable events documented.  Last Vitals:  Vitals:   02/14/24 1515 02/14/24 1531  BP: (!) 124/59 111/66  Pulse: 65 74  Resp: 15   Temp:  36.6 C  SpO2: 94% 93%    Last Pain:  Vitals:   02/14/24 1531  TempSrc: Oral  PainSc: 0-No pain                 Cordella P Elric Tirado

## 2024-02-14 NOTE — Progress Notes (Signed)
 Packing removed per order

## 2024-02-14 NOTE — Discharge Instructions (Addendum)
 Post-op Instructions following Mid-urethral Sling surgery   Removal of catheter You will go home with a catheter or tube in your bladder. This will be removed in clinic on Monday or Tuesday next week.     Diet:  You may return to your normal diet within 24 hours following your surgery. You may note some mild nausea and possibly vomiting the first 6-8 hours following surgery. This is usually due to the side effects of anesthesia, and will disappear quite soon. I would suggest clear liquids and a very light meal the first evening following your surgery.  Activity:  Your physical activity is to be restricted, especially during the first 2 weeks home. During this time use the following guidelines:  No lifting heavy objects (anything greater than 10 pounds). No driving a car and limit long car rides. No strenuous exercise, limits stairclimbing to a minimum. Do not swim or soak in a bath tub for 2 weeks. Do not place anything per vagina for 4 weeks.  Wound care: There is very little wound care.  The suprapubic incisions (above your pubic bone) are glued closed and this will peel off over the next 7-14 days.  They do not need to be covered.   The vaginal incision may ooze/bleed a little bit the first couple days after surgery.  This is normal.  It is okay to use a sanitary pad to help keep things clean.  Otherwise this incision needs no additional care.  Bowels:  You may need a stool softener and. A bowel movement every other day is reasonable. Use a mild laxative if needed, such as milk of magnesia 2-3 tablespoons, or 2 Dulcolax tablets. Call if you continue to have problems. If you had been taking narcotics for pain, before, during or after your surgery, you may be constipated. Take a laxative if necessary.  Medication:  You should resume your pre-surgery medications unless told not to. In addition you may be given an antibiotic to prevent or treat infection. Antibiotics are not always  necessary. Pain pills (Tramadol) may also be given to help with the incision and catheter discomfort. Tylenol  (acetaminophen ) or Advil (ibuprofen) which have no narcotics are better if the pain is not too bad. All medication should be taken as prescribed until the bottles are finished unless you are having an unusual reaction to one of the drugs.  Problems you should report to us :  a. Fever greater than 101F. b. Heavy bleeding, or clots (see notes above about blood in urine). c. Inability to urinate. d. Drug reactions (hives, rash, nausea, vomiting, diarrhea). e. Severe burning or pain with urination that is not improving.

## 2024-02-14 NOTE — Anesthesia Preprocedure Evaluation (Addendum)
 Anesthesia Evaluation  Patient identified by MRN, date of birth, ID band Patient awake    Reviewed: Allergy & Precautions, NPO status , Patient's Chart, lab work & pertinent test results  Airway Mallampati: II  TM Distance: >3 FB Neck ROM: Full    Dental no notable dental hx.    Pulmonary former smoker   Pulmonary exam normal        Cardiovascular hypertension, Pt. on medications  Rhythm:Regular Rate:Normal     Neuro/Psych   Anxiety Depression    negative neurological ROS     GI/Hepatic Neg liver ROS, hiatal hernia,GERD  ,,  Endo/Other  negative endocrine ROS    Renal/GU negative Renal ROS  negative genitourinary   Musculoskeletal  (+) Arthritis , Osteoarthritis,    Abdominal Normal abdominal exam  (+)   Peds  Hematology Lab Results      Component                Value               Date                      WBC                      7.3                 01/15/2024                HGB                      14.0                01/15/2024                HCT                      43.4                01/15/2024                MCV                      90.6                01/15/2024                PLT                      334                 01/15/2024             Lab Results      Component                Value               Date                      NA                       141                 01/15/2024                K  4.1                 01/15/2024                CO2                      27                  01/15/2024                GLUCOSE                  90                  01/15/2024                BUN                      12                  01/15/2024                CREATININE               0.92                01/15/2024                CALCIUM                   10.0                01/15/2024                EGFR                     70                  01/12/2023                GFRNONAA                  >60                 01/15/2024              Anesthesia Other Findings   Reproductive/Obstetrics                              Anesthesia Physical Anesthesia Plan  ASA: 2  Anesthesia Plan: General   Post-op Pain Management:    Induction: Intravenous  PONV Risk Score and Plan: 3 and Ondansetron, Dexamethasone and Treatment may vary due to age or medical condition  Airway Management Planned: Mask and LMA  Additional Equipment: None  Intra-op Plan:   Post-operative Plan: Extubation in OR  Informed Consent: I have reviewed the patients History and Physical, chart, labs and discussed the procedure including the risks, benefits and alternatives for the proposed anesthesia with the patient or authorized representative who has indicated his/her understanding and acceptance.     Dental advisory given  Plan Discussed with: CRNA  Anesthesia Plan Comments:          Anesthesia Quick Evaluation

## 2024-02-14 NOTE — Anesthesia Procedure Notes (Signed)
 Procedure Name: LMA Insertion Date/Time: 02/14/2024 1:06 PM  Performed by: Leopoldo Wanda DASEN, CRNAPre-anesthesia Checklist: Patient identified, Emergency Drugs available, Suction available and Patient being monitored Patient Re-evaluated:Patient Re-evaluated prior to induction Oxygen Delivery Method: Circle System Utilized Preoxygenation: Pre-oxygenation with 100% oxygen Induction Type: IV induction Ventilation: Mask ventilation without difficulty LMA: LMA inserted LMA Size: 4.0 Number of attempts: 1 Airway Equipment and Method: Bite block Placement Confirmation: positive ETCO2 Tube secured with: Tape Dental Injury: Teeth and Oropharynx as per pre-operative assessment

## 2024-02-14 NOTE — Transfer of Care (Signed)
 Immediate Anesthesia Transfer of Care Note  Patient: Michele Lowery  Procedure(s) Performed: CREATION, URETHRAL SLING, RETROPUBIC APPROACH, USING POLYPROPYLENE TAPE (Vagina ) CYSTOSCOPY (Urethra)  Patient Location: PACU  Anesthesia Type:General  Level of Consciousness: drowsy, patient cooperative, and responds to stimulation  Airway & Oxygen Therapy: Patient Spontanous Breathing  Post-op Assessment: Report given to RN and Post -op Vital signs reviewed and stable  Post vital signs: Reviewed and stable  Last Vitals:  Vitals Value Taken Time  BP 127/59 02/14/24 14:21  Temp 36.9 C 02/14/24 14:21  Pulse 73 02/14/24 14:23  Resp 9 02/14/24 14:23  SpO2 95% 02/14/24 14:23  Vitals shown include unfiled device data.  Last Pain:  Vitals:   02/14/24 1120  TempSrc:   PainSc: 0-No pain         Complications: No notable events documented.

## 2024-02-15 ENCOUNTER — Encounter (HOSPITAL_COMMUNITY): Payer: Self-pay | Admitting: Urology

## 2024-03-05 ENCOUNTER — Ambulatory Visit
Admission: RE | Admit: 2024-03-05 | Discharge: 2024-03-05 | Disposition: A | Discharge status: Home / Self Care | Source: Ambulatory Visit | Attending: Nurse Practitioner | Admitting: Nurse Practitioner

## 2024-03-05 ENCOUNTER — Other Ambulatory Visit: Payer: Self-pay | Admitting: Nurse Practitioner

## 2024-03-05 DIAGNOSIS — Z1231 Encounter for screening mammogram for malignant neoplasm of breast: Secondary | ICD-10-CM

## 2024-03-21 ENCOUNTER — Other Ambulatory Visit

## 2024-04-08 ENCOUNTER — Ambulatory Visit (HOSPITAL_BASED_OUTPATIENT_CLINIC_OR_DEPARTMENT_OTHER)
Admission: RE | Admit: 2024-04-08 | Discharge: 2024-04-08 | Disposition: A | Discharge status: Home / Self Care | Source: Ambulatory Visit | Attending: Nurse Practitioner | Admitting: Nurse Practitioner

## 2024-04-08 ENCOUNTER — Ambulatory Visit: Payer: Self-pay | Admitting: Nurse Practitioner

## 2024-04-08 DIAGNOSIS — E2839 Other primary ovarian failure: Secondary | ICD-10-CM | POA: Insufficient documentation

## 2024-07-25 ENCOUNTER — Ambulatory Visit: Payer: Self-pay | Admitting: Nurse Practitioner

## 2024-08-14 ENCOUNTER — Ambulatory Visit: Payer: Self-pay | Admitting: Nurse Practitioner
